# Patient Record
Sex: Female | Born: 1955 | Race: White | Hispanic: No | Marital: Single | Smoking: Never smoker
Health system: Southern US, Community
[De-identification: ages and names within clinical notes are randomized; demographics above are authoritative.]

## PROBLEM LIST (undated history)

## (undated) DIAGNOSIS — M7500 Adhesive capsulitis of unspecified shoulder: Secondary | ICD-10-CM

## (undated) DIAGNOSIS — H8110 Benign paroxysmal vertigo, unspecified ear: Secondary | ICD-10-CM

## (undated) DIAGNOSIS — M797 Fibromyalgia: Secondary | ICD-10-CM

## (undated) DIAGNOSIS — Z923 Personal history of irradiation: Secondary | ICD-10-CM

## (undated) DIAGNOSIS — N809 Endometriosis, unspecified: Secondary | ICD-10-CM

## (undated) DIAGNOSIS — B029 Zoster without complications: Secondary | ICD-10-CM

## (undated) DIAGNOSIS — R112 Nausea with vomiting, unspecified: Secondary | ICD-10-CM

## (undated) DIAGNOSIS — B54 Unspecified malaria: Secondary | ICD-10-CM

## (undated) DIAGNOSIS — E785 Hyperlipidemia, unspecified: Secondary | ICD-10-CM

## (undated) DIAGNOSIS — Z9889 Other specified postprocedural states: Secondary | ICD-10-CM

## (undated) DIAGNOSIS — C50919 Malignant neoplasm of unspecified site of unspecified female breast: Secondary | ICD-10-CM

## (undated) DIAGNOSIS — I890 Lymphedema, not elsewhere classified: Secondary | ICD-10-CM

## (undated) DIAGNOSIS — F419 Anxiety disorder, unspecified: Secondary | ICD-10-CM

## (undated) DIAGNOSIS — K219 Gastro-esophageal reflux disease without esophagitis: Secondary | ICD-10-CM

## (undated) HISTORY — PX: LAPAROSCOPY: SHX197

## (undated) HISTORY — PX: CHOLECYSTECTOMY: SHX55

## (undated) HISTORY — PX: VAGINAL HYSTERECTOMY: SUR661

## (undated) HISTORY — DX: Gastro-esophageal reflux disease without esophagitis: K21.9

## (undated) HISTORY — DX: Malignant neoplasm of unspecified site of unspecified female breast: C50.919

## (undated) HISTORY — DX: Zoster without complications: B02.9

## (undated) HISTORY — PX: MASTECTOMY: SHX3

## (undated) HISTORY — PX: BREAST CYST EXCISION: SHX579

## (undated) HISTORY — DX: Endometriosis, unspecified: N80.9

## (undated) HISTORY — DX: Hyperlipidemia, unspecified: E78.5

## (undated) HISTORY — DX: Adhesive capsulitis of unspecified shoulder: M75.00

## (undated) HISTORY — PX: TOTAL KNEE ARTHROPLASTY: SHX125

## (undated) HISTORY — PX: TONSILLECTOMY: SUR1361

## (undated) HISTORY — PX: BILATERAL SALPINGOOPHORECTOMY: SHX1223

## (undated) HISTORY — DX: Unspecified malaria: B54

## (undated) HISTORY — DX: Benign paroxysmal vertigo, unspecified ear: H81.10

---

## 1994-12-02 DIAGNOSIS — B54 Unspecified malaria: Secondary | ICD-10-CM

## 1994-12-02 HISTORY — DX: Unspecified malaria: B54

## 1998-05-23 ENCOUNTER — Other Ambulatory Visit: Admission: RE | Admit: 1998-05-23 | Discharge: 1998-05-23 | Payer: Self-pay | Admitting: *Deleted

## 1999-05-31 ENCOUNTER — Other Ambulatory Visit: Admission: RE | Admit: 1999-05-31 | Discharge: 1999-05-31 | Payer: Self-pay | Admitting: *Deleted

## 2000-06-27 ENCOUNTER — Other Ambulatory Visit: Admission: RE | Admit: 2000-06-27 | Discharge: 2000-06-27 | Payer: Self-pay | Admitting: *Deleted

## 2000-12-02 DIAGNOSIS — B029 Zoster without complications: Secondary | ICD-10-CM

## 2000-12-02 HISTORY — DX: Zoster without complications: B02.9

## 2001-06-25 ENCOUNTER — Encounter: Admission: RE | Admit: 2001-06-25 | Discharge: 2001-06-25 | Payer: Self-pay | Admitting: Internal Medicine

## 2001-06-25 ENCOUNTER — Encounter: Payer: Self-pay | Admitting: Internal Medicine

## 2001-07-29 ENCOUNTER — Other Ambulatory Visit: Admission: RE | Admit: 2001-07-29 | Discharge: 2001-07-29 | Payer: Self-pay | Admitting: *Deleted

## 2002-10-18 ENCOUNTER — Other Ambulatory Visit: Admission: RE | Admit: 2002-10-18 | Discharge: 2002-10-18 | Payer: Self-pay | Admitting: *Deleted

## 2003-01-11 ENCOUNTER — Encounter: Payer: Self-pay | Admitting: Internal Medicine

## 2003-01-11 ENCOUNTER — Encounter: Admission: RE | Admit: 2003-01-11 | Discharge: 2003-01-11 | Payer: Self-pay | Admitting: Internal Medicine

## 2003-01-31 ENCOUNTER — Encounter (INDEPENDENT_AMBULATORY_CARE_PROVIDER_SITE_OTHER): Payer: Self-pay | Admitting: Specialist

## 2003-01-31 ENCOUNTER — Encounter: Payer: Self-pay | Admitting: General Surgery

## 2003-01-31 ENCOUNTER — Ambulatory Visit (HOSPITAL_COMMUNITY): Admission: RE | Admit: 2003-01-31 | Discharge: 2003-02-01 | Payer: Self-pay | Admitting: General Surgery

## 2003-11-01 ENCOUNTER — Other Ambulatory Visit: Admission: RE | Admit: 2003-11-01 | Discharge: 2003-11-01 | Payer: Self-pay | Admitting: *Deleted

## 2003-11-07 ENCOUNTER — Ambulatory Visit (HOSPITAL_COMMUNITY): Admission: RE | Admit: 2003-11-07 | Discharge: 2003-11-07 | Payer: Self-pay | Admitting: *Deleted

## 2003-12-25 ENCOUNTER — Emergency Department (HOSPITAL_COMMUNITY): Admission: AD | Admit: 2003-12-25 | Discharge: 2003-12-25 | Payer: Self-pay | Admitting: Internal Medicine

## 2004-11-01 ENCOUNTER — Other Ambulatory Visit: Admission: RE | Admit: 2004-11-01 | Discharge: 2004-11-01 | Payer: Self-pay | Admitting: Obstetrics & Gynecology

## 2005-12-27 ENCOUNTER — Other Ambulatory Visit: Admission: RE | Admit: 2005-12-27 | Discharge: 2005-12-27 | Payer: Self-pay | Admitting: Obstetrics & Gynecology

## 2006-12-02 DIAGNOSIS — Z923 Personal history of irradiation: Secondary | ICD-10-CM

## 2006-12-02 DIAGNOSIS — C50919 Malignant neoplasm of unspecified site of unspecified female breast: Secondary | ICD-10-CM

## 2006-12-02 HISTORY — DX: Personal history of irradiation: Z92.3

## 2006-12-02 HISTORY — DX: Malignant neoplasm of unspecified site of unspecified female breast: C50.919

## 2006-12-02 HISTORY — PX: BREAST LUMPECTOMY: SHX2

## 2007-02-05 ENCOUNTER — Encounter: Admission: RE | Admit: 2007-02-05 | Discharge: 2007-02-05 | Payer: Self-pay | Admitting: Obstetrics & Gynecology

## 2007-02-16 ENCOUNTER — Encounter: Admission: RE | Admit: 2007-02-16 | Discharge: 2007-02-16 | Payer: Self-pay | Admitting: Oncology

## 2007-02-23 ENCOUNTER — Encounter: Admission: RE | Admit: 2007-02-23 | Discharge: 2007-02-23 | Payer: Self-pay | Admitting: Orthopedic Surgery

## 2007-03-02 ENCOUNTER — Encounter: Admission: RE | Admit: 2007-03-02 | Discharge: 2007-03-02 | Payer: Self-pay | Admitting: Oncology

## 2007-03-02 ENCOUNTER — Encounter (INDEPENDENT_AMBULATORY_CARE_PROVIDER_SITE_OTHER): Payer: Self-pay | Admitting: Diagnostic Radiology

## 2007-03-02 ENCOUNTER — Encounter (INDEPENDENT_AMBULATORY_CARE_PROVIDER_SITE_OTHER): Payer: Self-pay | Admitting: *Deleted

## 2007-03-11 ENCOUNTER — Encounter: Admission: RE | Admit: 2007-03-11 | Discharge: 2007-03-11 | Payer: Self-pay | Admitting: Orthopedic Surgery

## 2007-04-10 ENCOUNTER — Ambulatory Visit (HOSPITAL_COMMUNITY): Admission: RE | Admit: 2007-04-10 | Discharge: 2007-04-10 | Payer: Self-pay | Admitting: Surgery

## 2007-04-10 ENCOUNTER — Encounter: Admission: RE | Admit: 2007-04-10 | Discharge: 2007-04-10 | Payer: Self-pay | Admitting: Surgery

## 2007-04-10 ENCOUNTER — Encounter (INDEPENDENT_AMBULATORY_CARE_PROVIDER_SITE_OTHER): Payer: Self-pay | Admitting: Specialist

## 2007-04-16 ENCOUNTER — Ambulatory Visit: Payer: Self-pay | Admitting: Oncology

## 2007-04-22 LAB — COMPREHENSIVE METABOLIC PANEL
BUN: 12 mg/dL (ref 6–23)
CO2: 24 mEq/L (ref 19–32)
Calcium: 9.9 mg/dL (ref 8.4–10.5)
Chloride: 103 mEq/L (ref 96–112)
Creatinine, Ser: 0.68 mg/dL (ref 0.40–1.20)
Glucose, Bld: 91 mg/dL (ref 70–99)

## 2007-04-22 LAB — CBC WITH DIFFERENTIAL/PLATELET
Basophils Absolute: 0.1 10*3/uL (ref 0.0–0.1)
Eosinophils Absolute: 0.2 10*3/uL (ref 0.0–0.5)
HCT: 37.9 % (ref 34.8–46.6)
HGB: 13.4 g/dL (ref 11.6–15.9)
LYMPH%: 21.7 % (ref 14.0–48.0)
MCV: 83.2 fL (ref 81.0–101.0)
MONO#: 0.6 10*3/uL (ref 0.1–0.9)
MONO%: 8.3 % (ref 0.0–13.0)
NEUT#: 4.5 10*3/uL (ref 1.5–6.5)
NEUT%: 66.5 % (ref 39.6–76.8)
Platelets: 388 10*3/uL (ref 145–400)
RBC: 4.56 10*6/uL (ref 3.70–5.32)
WBC: 6.8 10*3/uL (ref 3.9–10.0)

## 2007-04-22 LAB — LACTATE DEHYDROGENASE: LDH: 165 U/L (ref 94–250)

## 2007-04-24 ENCOUNTER — Encounter: Admission: RE | Admit: 2007-04-24 | Discharge: 2007-04-24 | Payer: Self-pay | Admitting: Oncology

## 2007-05-01 ENCOUNTER — Ambulatory Visit (HOSPITAL_COMMUNITY): Admission: RE | Admit: 2007-05-01 | Discharge: 2007-05-01 | Payer: Self-pay | Admitting: Oncology

## 2007-05-26 ENCOUNTER — Encounter (INDEPENDENT_AMBULATORY_CARE_PROVIDER_SITE_OTHER): Payer: Self-pay | Admitting: Surgery

## 2007-05-26 ENCOUNTER — Inpatient Hospital Stay (HOSPITAL_COMMUNITY): Admission: RE | Admit: 2007-05-26 | Discharge: 2007-05-27 | Payer: Self-pay | Admitting: Surgery

## 2007-06-10 ENCOUNTER — Ambulatory Visit: Payer: Self-pay | Admitting: Oncology

## 2007-06-16 ENCOUNTER — Encounter (INDEPENDENT_AMBULATORY_CARE_PROVIDER_SITE_OTHER): Payer: Self-pay | Admitting: Obstetrics & Gynecology

## 2007-06-16 ENCOUNTER — Ambulatory Visit (HOSPITAL_COMMUNITY): Admission: RE | Admit: 2007-06-16 | Discharge: 2007-06-17 | Payer: Self-pay | Admitting: Obstetrics & Gynecology

## 2007-06-19 ENCOUNTER — Ambulatory Visit: Admission: RE | Admit: 2007-06-19 | Discharge: 2007-09-22 | Payer: Self-pay | Admitting: *Deleted

## 2007-06-21 ENCOUNTER — Inpatient Hospital Stay (HOSPITAL_COMMUNITY): Admission: AD | Admit: 2007-06-21 | Discharge: 2007-06-23 | Payer: Self-pay | Admitting: Obstetrics and Gynecology

## 2007-08-06 ENCOUNTER — Encounter: Admission: RE | Admit: 2007-08-06 | Discharge: 2007-11-04 | Payer: Self-pay | Admitting: Radiation Oncology

## 2007-08-11 ENCOUNTER — Ambulatory Visit (HOSPITAL_COMMUNITY): Admission: RE | Admit: 2007-08-11 | Discharge: 2007-08-11 | Payer: Self-pay | Admitting: Radiation Oncology

## 2007-09-22 ENCOUNTER — Ambulatory Visit: Payer: Self-pay | Admitting: Oncology

## 2007-09-24 LAB — CBC WITH DIFFERENTIAL/PLATELET
Basophils Absolute: 0 10*3/uL (ref 0.0–0.1)
EOS%: 0.1 % (ref 0.0–7.0)
HGB: 12.7 g/dL (ref 11.6–15.9)
MCH: 28.6 pg (ref 26.0–34.0)
NEUT#: 6.7 10*3/uL — ABNORMAL HIGH (ref 1.5–6.5)
RDW: 13.6 % (ref 11.3–14.5)
lymph#: 0.7 10*3/uL — ABNORMAL LOW (ref 0.9–3.3)

## 2007-09-24 LAB — COMPREHENSIVE METABOLIC PANEL
ALT: 15 U/L (ref 0–35)
AST: 18 U/L (ref 0–37)
Albumin: 4.8 g/dL (ref 3.5–5.2)
BUN: 14 mg/dL (ref 6–23)
Calcium: 9.7 mg/dL (ref 8.4–10.5)
Chloride: 102 mEq/L (ref 96–112)
Potassium: 3.9 mEq/L (ref 3.5–5.3)

## 2008-01-19 ENCOUNTER — Ambulatory Visit: Payer: Self-pay | Admitting: Oncology

## 2008-01-21 ENCOUNTER — Encounter: Admission: RE | Admit: 2008-01-21 | Discharge: 2008-01-21 | Payer: Self-pay | Admitting: Oncology

## 2008-01-29 ENCOUNTER — Ambulatory Visit (HOSPITAL_COMMUNITY): Admission: RE | Admit: 2008-01-29 | Discharge: 2008-01-29 | Payer: Self-pay | Admitting: Oncology

## 2008-07-12 ENCOUNTER — Ambulatory Visit: Payer: Self-pay | Admitting: Oncology

## 2008-07-14 LAB — CBC WITH DIFFERENTIAL/PLATELET
Basophils Absolute: 0 10*3/uL (ref 0.0–0.1)
Eosinophils Absolute: 0.1 10*3/uL (ref 0.0–0.5)
HGB: 11.8 g/dL (ref 11.6–15.9)
MCV: 86 fL (ref 81.0–101.0)
MONO#: 0.4 10*3/uL (ref 0.1–0.9)
MONO%: 6.5 % (ref 0.0–13.0)
NEUT#: 3.8 10*3/uL (ref 1.5–6.5)
Platelets: 261 10*3/uL (ref 145–400)
RDW: 14.1 % (ref 11.3–14.5)
WBC: 5.7 10*3/uL (ref 3.9–10.0)

## 2008-08-01 IMAGING — US US PELVIS COMPLETE
1 series · 11 of 11 positions shown · non-contrast
Comparison: none

CLINICAL DATA: Status post hysterectomy 06/16/07 with oophorectomy due to a history of breast cancer.  Pelvic pain.  The patient could not tolerate transvaginal or translabial technique.  
 TRANSABDOMINAL PELVIC ULTRASOUND:
TECHNIQUE: Transabdominal ultrasound examination of the pelvis was performed including evaluation of the uterus, ovaries, adnexal regions, and pelvic cul-de-sac.

[Series 1: us pelvis complete · 0.30mm/px · 11 of 11 slices shown]
[im 1/11]
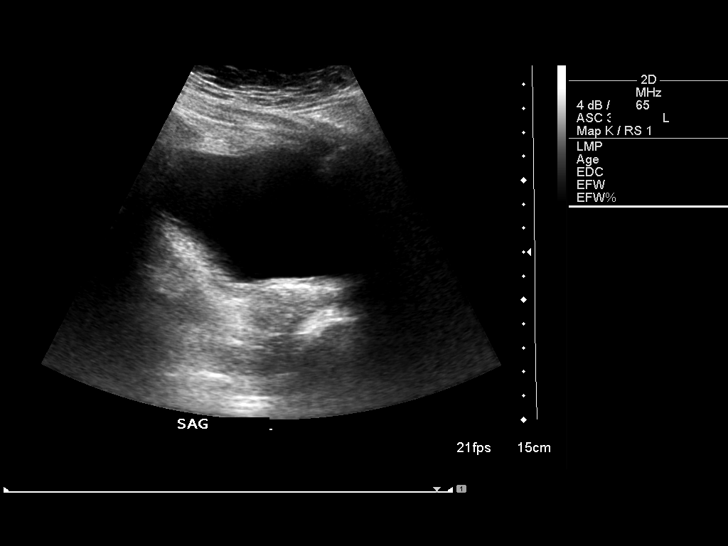
[im 2/11]
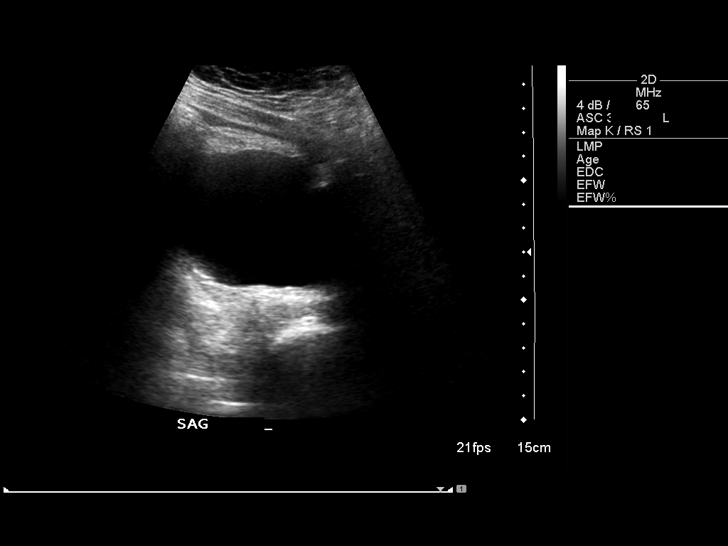
[im 3/11]
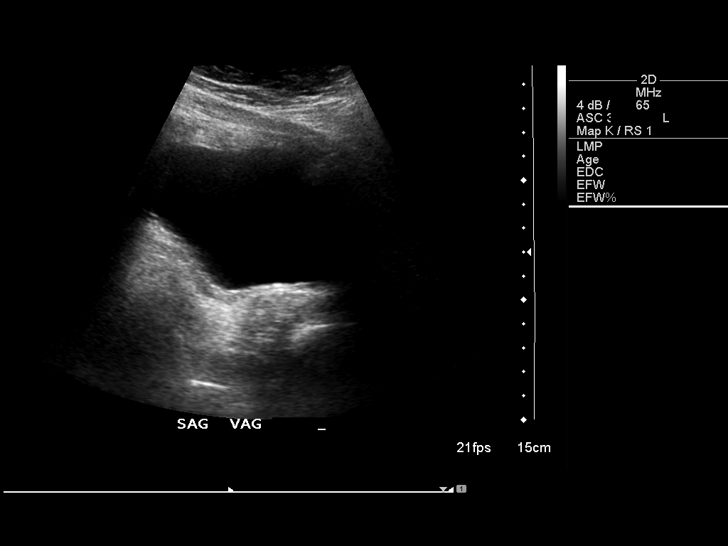
[im 4/11]
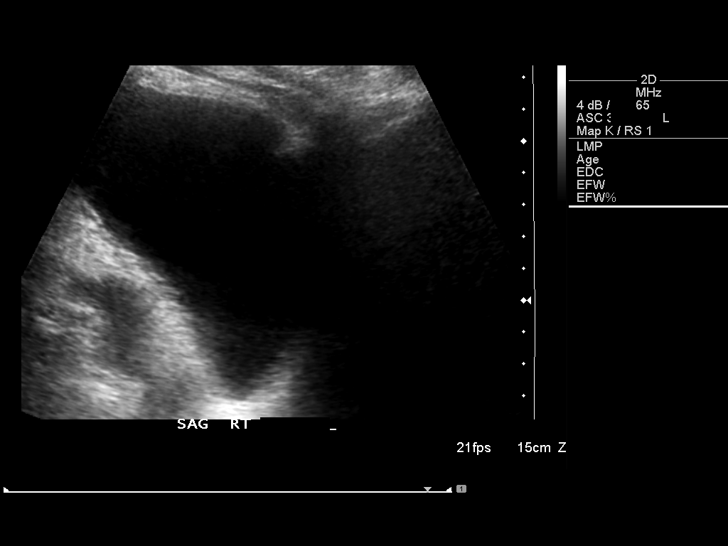
[im 5/11]
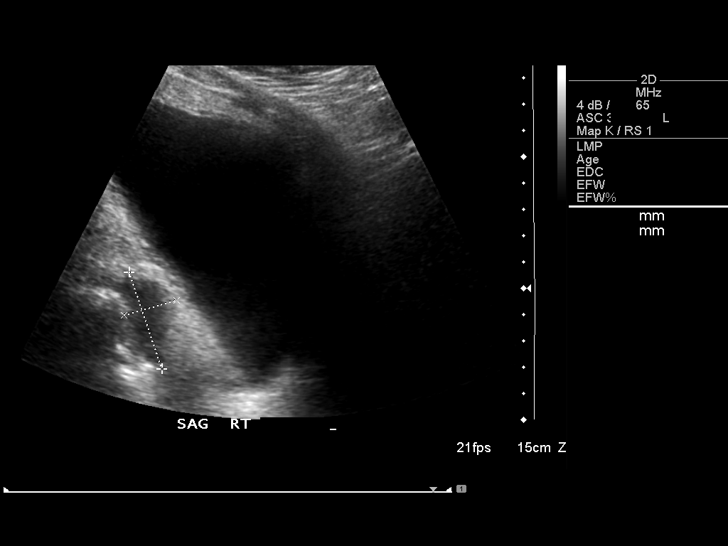
[im 6/11]
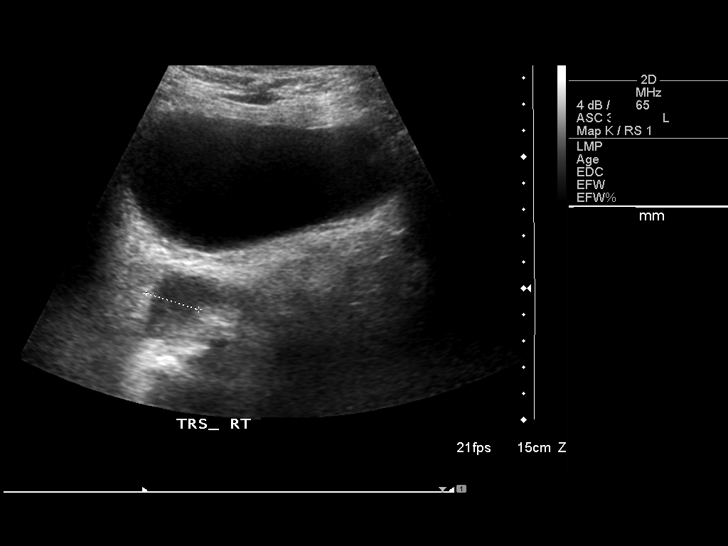
[im 7/11]
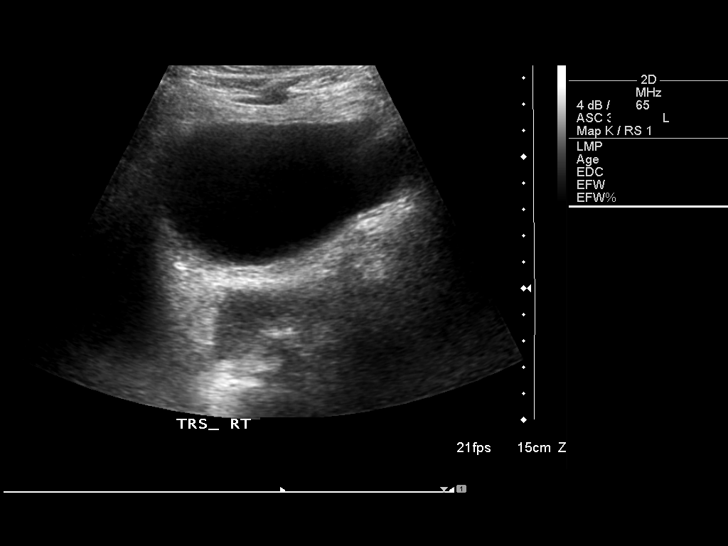
[im 8/11]
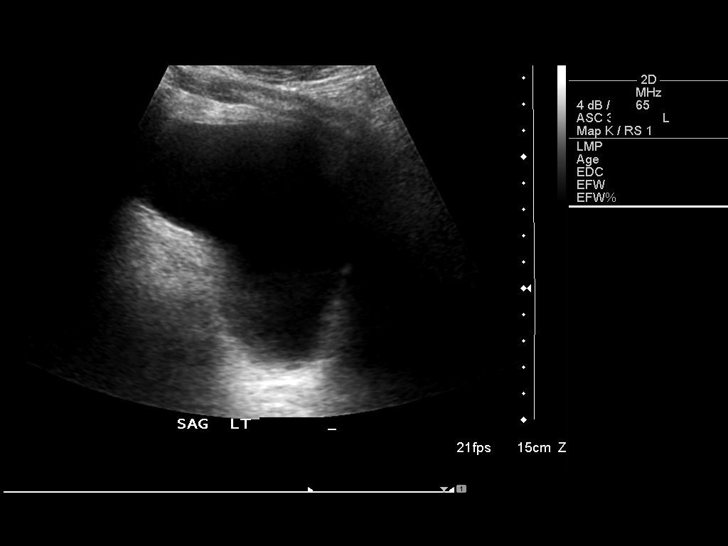
[im 9/11]
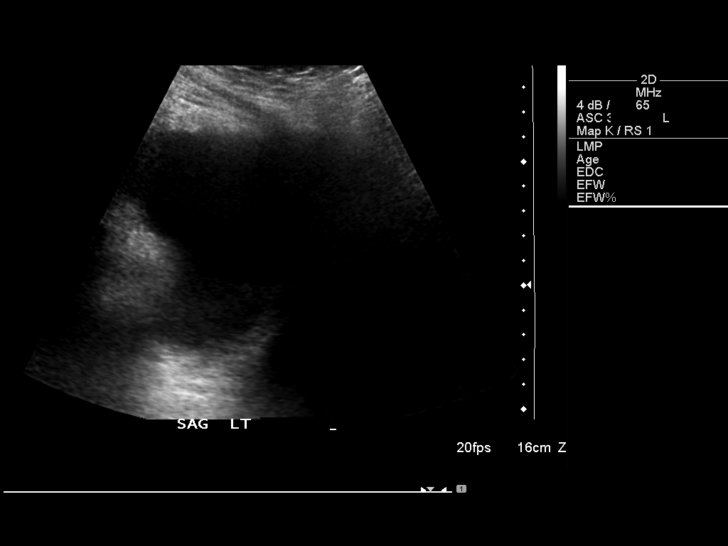
[im 10/11]
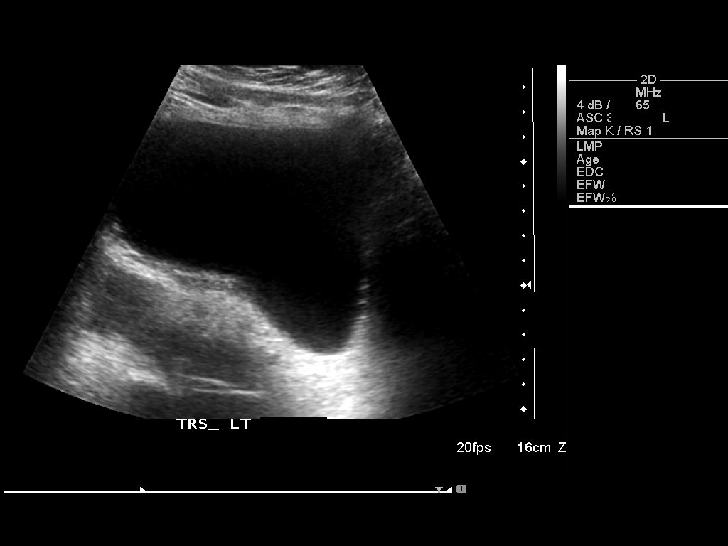
[im 11/11]
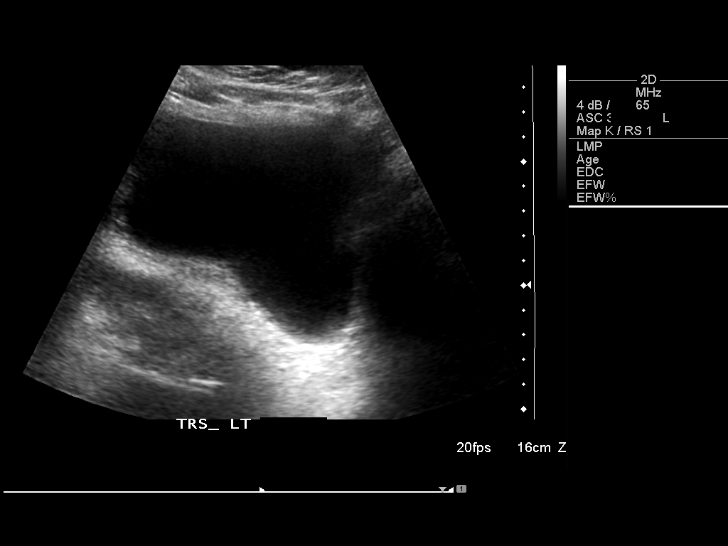

[11 of 11 positions shown; findings below may reference images not displayed]

FINDINGS: The vaginal cuff is unremarkable by transabdominal technique.  Neither ovary is visualized, compatible with the history of oophorectomy.  In the right adnexa is a 3.9 x 2.1 x 2.1 cm hypoechoic oval area.
IMPRESSION: 3.9 cm hypoechoic area in the region of the right adnexa, which could represent fluid collection, but is not further evaluated due to inability of the patient to tolerate transvaginal technique.  CT may be helpful for further evaluation, as clinically indicated.

## 2008-08-03 ENCOUNTER — Ambulatory Visit (HOSPITAL_COMMUNITY): Admission: RE | Admit: 2008-08-03 | Discharge: 2008-08-03 | Payer: Self-pay | Admitting: Oncology

## 2008-10-07 ENCOUNTER — Ambulatory Visit: Payer: Self-pay | Admitting: Oncology

## 2008-10-07 LAB — CBC WITH DIFFERENTIAL/PLATELET
Eosinophils Absolute: 0.1 10*3/uL (ref 0.0–0.5)
MCV: 86.8 fL (ref 81.0–101.0)
MONO#: 0.4 10*3/uL (ref 0.1–0.9)
MONO%: 7.1 % (ref 0.0–13.0)
NEUT#: 3.9 10*3/uL (ref 1.5–6.5)
RBC: 4.24 10*6/uL (ref 3.70–5.32)
RDW: 13.4 % (ref 11.3–14.5)
WBC: 5.8 10*3/uL (ref 3.9–10.0)
lymph#: 1.4 10*3/uL (ref 0.9–3.3)

## 2008-10-07 LAB — COMPREHENSIVE METABOLIC PANEL
Albumin: 3.7 g/dL (ref 3.5–5.2)
Alkaline Phosphatase: 48 U/L (ref 39–117)
BUN: 12 mg/dL (ref 6–23)
CO2: 31 mEq/L (ref 19–32)
Glucose, Bld: 80 mg/dL (ref 70–99)
Total Bilirubin: 0.8 mg/dL (ref 0.3–1.2)

## 2008-12-16 ENCOUNTER — Encounter: Payer: Self-pay | Admitting: Oncology

## 2008-12-16 ENCOUNTER — Ambulatory Visit: Admission: RE | Admit: 2008-12-16 | Discharge: 2008-12-16 | Payer: Self-pay | Admitting: Oncology

## 2008-12-16 ENCOUNTER — Ambulatory Visit: Payer: Self-pay | Admitting: Vascular Surgery

## 2008-12-22 ENCOUNTER — Encounter: Admission: RE | Admit: 2008-12-22 | Discharge: 2009-01-16 | Payer: Self-pay | Admitting: Oncology

## 2009-01-23 ENCOUNTER — Encounter: Admission: RE | Admit: 2009-01-23 | Discharge: 2009-01-23 | Payer: Self-pay | Admitting: Oncology

## 2009-02-17 ENCOUNTER — Encounter: Admission: RE | Admit: 2009-02-17 | Discharge: 2009-02-17 | Payer: Self-pay | Admitting: Oncology

## 2009-03-16 ENCOUNTER — Ambulatory Visit: Payer: Self-pay | Admitting: Oncology

## 2009-03-20 LAB — CBC WITH DIFFERENTIAL/PLATELET
Basophils Absolute: 0 10*3/uL (ref 0.0–0.1)
EOS%: 1.3 % (ref 0.0–7.0)
Eosinophils Absolute: 0.1 10*3/uL (ref 0.0–0.5)
HGB: 11.8 g/dL (ref 11.6–15.9)
LYMPH%: 29.6 % (ref 14.0–49.7)
MCH: 29.5 pg (ref 25.1–34.0)
MCV: 85.7 fL (ref 79.5–101.0)
MONO%: 7.7 % (ref 0.0–14.0)
NEUT#: 3.2 10*3/uL (ref 1.5–6.5)
Platelets: 259 10*3/uL (ref 145–400)

## 2009-03-20 LAB — COMPREHENSIVE METABOLIC PANEL
Alkaline Phosphatase: 44 U/L (ref 39–117)
BUN: 15 mg/dL (ref 6–23)
Creatinine, Ser: 0.63 mg/dL (ref 0.40–1.20)
Glucose, Bld: 99 mg/dL (ref 70–99)
Total Bilirubin: 0.5 mg/dL (ref 0.3–1.2)

## 2009-03-20 LAB — RESEARCH LABS

## 2009-03-21 LAB — VITAMIN D 25 HYDROXY (VIT D DEFICIENCY, FRACTURES): Vit D, 25-Hydroxy: 47 ng/mL (ref 30–89)

## 2009-03-21 LAB — CANCER ANTIGEN 27.29: CA 27.29: 12 U/mL (ref 0–39)

## 2009-08-18 ENCOUNTER — Encounter: Admission: RE | Admit: 2009-08-18 | Discharge: 2009-08-18 | Payer: Self-pay | Admitting: Oncology

## 2009-09-22 ENCOUNTER — Ambulatory Visit: Payer: Self-pay | Admitting: Oncology

## 2009-09-26 LAB — CBC WITH DIFFERENTIAL/PLATELET
BASO%: 0.6 % (ref 0.0–2.0)
Eosinophils Absolute: 0.1 10*3/uL (ref 0.0–0.5)
LYMPH%: 22.5 % (ref 14.0–49.7)
MCHC: 33.8 g/dL (ref 31.5–36.0)
MONO#: 0.2 10*3/uL (ref 0.1–0.9)
NEUT#: 4.2 10*3/uL (ref 1.5–6.5)
Platelets: 297 10*3/uL (ref 145–400)
RBC: 4.06 10*6/uL (ref 3.70–5.45)
RDW: 13.4 % (ref 11.2–14.5)
WBC: 5.9 10*3/uL (ref 3.9–10.3)
lymph#: 1.3 10*3/uL (ref 0.9–3.3)

## 2009-09-27 LAB — LACTATE DEHYDROGENASE: LDH: 134 U/L (ref 94–250)

## 2009-09-27 LAB — COMPREHENSIVE METABOLIC PANEL
ALT: 11 U/L (ref 0–35)
Albumin: 4.2 g/dL (ref 3.5–5.2)
CO2: 24 mEq/L (ref 19–32)
Glucose, Bld: 170 mg/dL — ABNORMAL HIGH (ref 70–99)
Potassium: 3.9 mEq/L (ref 3.5–5.3)
Sodium: 140 mEq/L (ref 135–145)
Total Bilirubin: 0.3 mg/dL (ref 0.3–1.2)
Total Protein: 6.6 g/dL (ref 6.0–8.3)

## 2009-11-02 ENCOUNTER — Encounter: Admission: RE | Admit: 2009-11-02 | Discharge: 2009-11-02 | Payer: Self-pay | Admitting: Oncology

## 2010-03-28 ENCOUNTER — Ambulatory Visit: Payer: Self-pay | Admitting: Oncology

## 2010-03-29 LAB — COMPREHENSIVE METABOLIC PANEL
Albumin: 3.8 g/dL (ref 3.5–5.2)
CO2: 30 mEq/L (ref 19–32)
Calcium: 9.2 mg/dL (ref 8.4–10.5)
Chloride: 107 mEq/L (ref 96–112)
Glucose, Bld: 96 mg/dL (ref 70–99)
Potassium: 4 mEq/L (ref 3.5–5.3)
Sodium: 142 mEq/L (ref 135–145)
Total Bilirubin: 0.5 mg/dL (ref 0.3–1.2)
Total Protein: 6.5 g/dL (ref 6.0–8.3)

## 2010-03-29 LAB — CBC WITH DIFFERENTIAL/PLATELET
Eosinophils Absolute: 0.1 10*3/uL (ref 0.0–0.5)
LYMPH%: 28.2 % (ref 14.0–49.7)
MONO#: 0.5 10*3/uL (ref 0.1–0.9)
NEUT#: 3.8 10*3/uL (ref 1.5–6.5)
Platelets: 273 10*3/uL (ref 145–400)
RBC: 4.04 10*6/uL (ref 3.70–5.45)
WBC: 6.1 10*3/uL (ref 3.9–10.3)
lymph#: 1.7 10*3/uL (ref 0.9–3.3)

## 2010-03-29 LAB — LACTATE DEHYDROGENASE: LDH: 148 U/L (ref 94–250)

## 2010-03-30 LAB — CANCER ANTIGEN 27.29: CA 27.29: 10 U/mL (ref 0–39)

## 2010-08-08 ENCOUNTER — Encounter: Admission: RE | Admit: 2010-08-08 | Discharge: 2010-09-20 | Payer: Self-pay | Admitting: Oncology

## 2010-08-09 ENCOUNTER — Ambulatory Visit: Payer: Self-pay | Admitting: Oncology

## 2010-08-20 ENCOUNTER — Encounter: Admission: RE | Admit: 2010-08-20 | Discharge: 2010-08-20 | Payer: Self-pay | Admitting: Oncology

## 2010-10-09 ENCOUNTER — Ambulatory Visit: Payer: Self-pay | Admitting: Oncology

## 2010-10-11 LAB — CBC WITH DIFFERENTIAL/PLATELET
EOS%: 1.3 % (ref 0.0–7.0)
LYMPH%: 25.6 % (ref 14.0–49.7)
MCH: 30 pg (ref 25.1–34.0)
MCHC: 34.7 g/dL (ref 31.5–36.0)
MCV: 86.3 fL (ref 79.5–101.0)
MONO%: 6.5 % (ref 0.0–14.0)
Platelets: 264 10*3/uL (ref 145–400)
RBC: 3.93 10*6/uL (ref 3.70–5.45)
RDW: 13.4 % (ref 11.2–14.5)

## 2010-10-12 LAB — COMPREHENSIVE METABOLIC PANEL
AST: 20 U/L (ref 0–37)
Albumin: 4.1 g/dL (ref 3.5–5.2)
Alkaline Phosphatase: 45 U/L (ref 39–117)
BUN: 13 mg/dL (ref 6–23)
Potassium: 3.8 mEq/L (ref 3.5–5.3)
Sodium: 143 mEq/L (ref 135–145)
Total Bilirubin: 0.3 mg/dL (ref 0.3–1.2)
Total Protein: 6.2 g/dL (ref 6.0–8.3)

## 2010-10-12 LAB — CANCER ANTIGEN 27.29: CA 27.29: 10 U/mL (ref 0–39)

## 2010-10-12 LAB — VITAMIN D 25 HYDROXY (VIT D DEFICIENCY, FRACTURES): Vit D, 25-Hydroxy: 72 ng/mL (ref 30–89)

## 2010-12-23 ENCOUNTER — Encounter: Payer: Self-pay | Admitting: Orthopedic Surgery

## 2011-04-11 ENCOUNTER — Other Ambulatory Visit: Payer: Self-pay | Admitting: Oncology

## 2011-04-11 ENCOUNTER — Encounter (HOSPITAL_BASED_OUTPATIENT_CLINIC_OR_DEPARTMENT_OTHER): Payer: PRIVATE HEALTH INSURANCE | Admitting: Oncology

## 2011-04-11 DIAGNOSIS — C50219 Malignant neoplasm of upper-inner quadrant of unspecified female breast: Secondary | ICD-10-CM

## 2011-04-11 DIAGNOSIS — Z170 Estrogen receptor positive status [ER+]: Secondary | ICD-10-CM

## 2011-04-11 LAB — CBC WITH DIFFERENTIAL/PLATELET
Basophils Absolute: 0.1 10*3/uL (ref 0.0–0.1)
EOS%: 1.6 % (ref 0.0–7.0)
Eosinophils Absolute: 0.1 10*3/uL (ref 0.0–0.5)
HCT: 34.5 % — ABNORMAL LOW (ref 34.8–46.6)
HGB: 11.9 g/dL (ref 11.6–15.9)
MCH: 29.8 pg (ref 25.1–34.0)
NEUT#: 2.8 10*3/uL (ref 1.5–6.5)
NEUT%: 59.7 % (ref 38.4–76.8)
RDW: 13.5 % (ref 11.2–14.5)
lymph#: 1.4 10*3/uL (ref 0.9–3.3)

## 2011-04-11 LAB — COMPREHENSIVE METABOLIC PANEL
Albumin: 3.9 g/dL (ref 3.5–5.2)
BUN: 11 mg/dL (ref 6–23)
CO2: 29 mEq/L (ref 19–32)
Calcium: 9.6 mg/dL (ref 8.4–10.5)
Chloride: 102 mEq/L (ref 96–112)
Creatinine, Ser: 0.61 mg/dL (ref 0.40–1.20)
Glucose, Bld: 96 mg/dL (ref 70–99)
Potassium: 3.5 mEq/L (ref 3.5–5.3)

## 2011-04-12 LAB — VITAMIN D 25 HYDROXY (VIT D DEFICIENCY, FRACTURES): Vit D, 25-Hydroxy: 55 ng/mL (ref 30–89)

## 2011-04-12 LAB — CANCER ANTIGEN 27.29: CA 27.29: 13 U/mL (ref 0–39)

## 2011-04-16 NOTE — Op Note (Signed)
NAMEBLAYKE, Savannah [name] NO.:  [id number]   MEDICAL RECORD NO.:  [id number]          PATIENT TYPE:  OIB   LOCATION:  9399                          FACILITY:  [facility]   PHYSICIAN:  Freddy Finner, M.D.   DATE OF BIRTH:  01-Feb-1956   DATE OF PROCEDURE:  DATE OF DISCHARGE:                               OPERATIVE REPORT   PREOPERATIVE DIAGNOSES:  1. Breast cancer with recommended oophorectomy.  2. Uterine leiomyomata.  3. Previous history of pelvic endometriosis.   POSTOPERATIVE DIAGNOSES:  1. Breast cancer with recommended oophorectomy.  2. Uterine leiomyomata.  3. Previous history of pelvic endometriosis.   OPERATIVE PROCEDURE:  Laparoscopically assisted vaginal hysterectomy,  bilateral salpingo-oophorectomy.   SECONDARY DIAGNOSES:  1. Pelvic adhesions and pericecal adhesions.  2. Normal appendix.  3. Uterine weight of 297 g.  4. Estimated intraoperative blood loss 350 cc.  Surgeon Dr. Jennette Kettle.      Assistant __________  .  Anesthesia:  General endotracheal.   INTRAOPERATIVE COMPLICATIONS:  None, although the degree of difficulty  was moderate due to the patient's nulliparous state and the very large  size of her uterus.   The patient is a 55 year old who was recently diagnosed with breast  cancer.  At the recommendation of her oncologist she has requested an  oophorectomy, but because of her known uterine leiomyomata it is elected  to proceed with more definitive surgery including hysterectomy,  bilateral salpingo-oophorectomy.  She is admitted on the morning of  surgery for that procedure.  She was given a 1 g bolus of Ancef IV  preoperatively.  She was placed in serial compression hose for deep vein  thrombosis prophylaxis.  She was brought to the operating room; there  placed on __________  adequate general endotracheal anesthesia.  Placed  in the dorsal lithotomy position using the [facility] stirrup system.  Betadine prep of the abdomen, peroneum, mons, and  vagina was carried out  using Betadine scrub followed by Betadine solution.  Bladder was  evacuated with sterile technique using a Foley catheter.  Cervix was  visualized, and __________  tenaculum attached under direct  visualization.  Sterile drapes were applied.  Two small incisions were  made; one in the umbilicus and one just above the symphysis.  Through  the upper incision an 11 mm bladed disposable trocar was introduced  while manually elevating the abdominal wall.  Direct inspection with a  laparoscope revealed adequate placement with no evidence of injury on  entry.  __________  using carbon dioxide gas.  A 5 mm trocar was placed  through the lower incision under direct visualization and through it.  Various instruments used during the procedure including a spring-loaded  grasping forceps and Nezhat irrigation system.  Systematic examination  of abdominal and pelvic contents was carried out, and photographs were  made and retained in the office record.  The patient did not appear to  have abnormalities in the upper abdomen.  The liver was visualized.  There were some pericecal adhesions presumed to be congenital __________  in the absence of any history of appendicitis.  They did not  create a  blind loop and were treated.  The appendix was visualized and  photographed and was normal.  The uterus was markedly enlarged and  irregularly nodular.  The ovaries were small and atrophic in appearance.  The left ovary was adherent to the pelvic side wall.  There were  scattered lesions consistent with tattooing from previous laser  procedure for endometriosis.  No active evidence of endometriosis was  identified.  Using the grasping forceps the right adnexa the elevated.  The Jarvis tripolar device was used to the operating channel of the  laparoscope and the __________  pelvic ligament.  Upper broad ligament,  round ligament were progressively sealed and divided.  The dissection  was  carried down to a level just above the uterine arteries.  The left  side was treated and essentially identically, and in the process of  elevating the ovary the adhesions attaching it to the peritoneum were  freed with no evidence of residual ovarian tissue in the peritoneum.  This dissection too was carried down to the level just above the uterine  arteries.  Cul-de-sac was clear anteriorly and posteriorly.  Attention  was then turned vaginally.  The patient's nulliparous state and  postmenopausal state compromised exposure because of the narrow vaginal  vault and vaginal canal.  __________  posterior vaginal retractor was  used.  __________  retractors were used anteriorly and laterally.  The  Hulka tenaculum was removed.  The cervix was grasped with a Christella Hartigan  tenaculum.  __________  incision was made while attempting the mucosa  posterior to the cervix.  The cervix was circumscribed with a scalpel to  release the mucosa.  The ligature bipolar device was then used to seal  and divide the __________  pedicles, bladder pillars.  This was done on  each side.  The bladder was carefully advanced along the cervix  anteriorly.  Continued progressive small __________  were taken with the  ligature to seal and then sharply divide the pedicles including the  cardinal ligament pedicle.  Anterior peritoneum was entered.  The  bladder was filled with irrigating solution stained with __________  to  be certain that no trauma or opening to the bladder had occurred, and  this was confirmed.  Continued pedicles were slowly and carefully  developed sealing the cardinal ligaments and perhaps two or three  pedicles.  The uterine artery pedicle was sealed and divided.  A pedicle  above the uterine arteries was sealed and divided.  The uterus was then  __________  delivery.  Total weight of the uterus was 297 g.  One small  peritoneal pedicle was sealed and divided with ligature after delivery  of the uterus  through the vaginal introitus.  __________  of the vagina  were anchored to the uterus __________  with mattress sutures 0  Monocryl.  Posterior peritoneum was closed, and uterus __________  was  plicated with uninterrupted 0 Monocryl suture.  Cuff was closed  vertically with figure of eight x0 Monocryl.  Hemostasis of the cuff was  complete.  There was a small second degree laceration in the midline of  the vagina posteriorly due to the traction and the postmenopausal  condition of the vagina.  This was closed with a figure of eight and 0  Monocryl with adequate hemostasis.  Foley catheter was left indwelling.  Reinspection was carried out abdominally using the laparoscope and  Nezhat system.  Copious irrigation was carried out.  All pedicles were  hemostatic.  This was confirmed under __________  abdominal pressure.  The irrigating solution was aspirated from the abdomen.  Gas was allowed  to escape from the abdomen.  The instruments were removed.  The  incisions were anesthetized with 1/4% plain Marcaine.  Incisions were  closed with interrupted __________  sutures of 3-0 Dexon.  Steri-Strips  were applied to the lower incision.  The patient was awake and taken to  the recovery room in good condition.      Freddy Finner, M.D.  Electronically Signed     WRN/MEDQ  D:  06/16/2007  T:  06/16/2007  Job:  [id number]   cc:   Pierce Crane, M.D.  Fax: [phone]

## 2011-04-16 NOTE — H&P (Signed)
Savannah [name], BACORN NO.:  [id number]   MEDICAL RECORD NO.:  [id number]          PATIENT TYPE:  AMB   LOCATION:  SDC                           FACILITY:  [facility]   PHYSICIAN:  Freddy Finner, M.D.   DATE OF BIRTH:  Jul 17, 1956   DATE OF ADMISSION:  06/16/2007  DATE OF DISCHARGE:                              HISTORY & PHYSICAL   ADMITTING DIAGNOSES:  1. Uterine leiomyomata.  2. Estrogen receptor positive breast cancer.   The patient is a 55 year old, white, single female, gravida 2, para 0  who was recently diagnosed with breast cancer and has completed her  primary lumpectomy and sentinel node axillary biopsy.  She is being  followed by Dr. Pierce Crane for oncology care.  After a consultation  with the patient and with Dr. Donnie Coffin it is felt preferred that she have  bilateral salpingo-oophorectomy and because of the presence of fibroids,  the plan is to do a laparoscopically-assisted vaginal hysterectomy and  bilateral salpingo-oophorectomy.   CURRENT REVIEW OF SYSTEMS:  Primarily remarkable for fibromyalgia  symptoms.  She does have some tenderness in the right axilla and  anterior chest wall consistent with the recent biopsies.  She has no GI  or GU complaints.   PAST MEDICAL HISTORY:  The patient has no known significant systemic  medical problems other than fibromyalgia.   PREVIOUS SURGICAL PROCEDURES:  1. D&E for TAB many years ago.  2. The recent surgery described above is her only other surgical      procedure.   DRUG ALLERGIES:  1. DARVON.  2. ULTRAM.   She has never required a blood transfusion.  She is not a cigarette  smoker.   MEDICATIONS:  1. Flexeril.  2. Effexor extended release 150 mg a day.  3. Pepcid as needed.  4. Xanax as needed.  5. Percocet as needed for fibromyalgia pain.  6. Ambien 10 mg at bedtime as needed for sleep.   FAMILY HISTORY:  Noncontributory.   PHYSICAL EXAMINATION:  HEENT:  Grossly within normal limits.   Thyroid  gland is not palpably enlarged.  VITAL SIGNS:  Blood pressure in the office 120/70, weight 184.8.  CHEST:  Clear to auscultation throughout.  HEART:  Normal sinus rhythm without murmurs, rubs, or gallops.  BREAST:  Remarkable for 2 recent surgical scars on the right breast. The  left breast is unremarkable.  ABDOMEN:  Soft and nontender without appreciable organomegaly or  palpable masses.  EXTREMITIES:  Without cyanosis, clubbing, or edema.  PELVIC:  External genitalia, vagina, and cervix are normal to  inspection.  Bimanual reveals the uterus to be anterior and 8-9 weeks'  gestation size.  There are no palpable adnexal masses.  The rectum is  palpably normal and rectovaginal exam confirms the above findings.   ASSESSMENT:  1. Uterine leiomyomata.  2. Recent diagnosis of estrogen receptor positive breast cancer.   PLAN:  Laparoscopically-assisted vaginal hysterectomy and bilateral  salpingo-oophorectomy as part of her treatment plan for breast cancer.  The patient has reviewed a video in the office today which carefully  describes the operative procedure proposed  including the potential risk  of injury to other organs, risk of hemorrhage, risk of infection, risk  of larger surgery.  I have discussed this at length with her.  She is  prepared to proceed and is admitted at this time for that purpose.      Freddy Finner, M.D.  Electronically Signed     WRN/MEDQ  D:  06/15/2007  T:  06/15/2007  Job:  [id number]

## 2011-04-16 NOTE — Discharge Summary (Signed)
[name], [name] NO.:  [id number]   MEDICAL RECORD NO.:  [id number]          PATIENT TYPE:  INP   LOCATION:  9302                          FACILITY:  [facility]   PHYSICIAN:  Freddy Finner, M.D.   DATE OF BIRTH:  1956/05/07   DATE OF ADMISSION:  06/21/2007  DATE OF DISCHARGE:  06/23/2007                               DISCHARGE SUMMARY   ADMITTING DIAGNOSIS:  Cuff cellulitis following laparoscopic-assisted  vaginal hysterectomy.   DISCHARGE DIAGNOSIS:  Cuff cellulitis following laparoscopic-assisted  vaginal hysterectomy.   DISCHARGE DISPOSITION:  The patient was afebrile for at least 24 hours  at the time of her discharge on the third hospital day.  At that time  her white count was 7.1.  She was discharged home to continue all  antibiotics to complete a full week of therapy, specifically I think she  received Augmentin 875 b.i.d.  She is to resume all of her other  preoperative discharge instructions from her  discharge on June 17, 2007.  She is also to resume all of her admission medications recorded  on the reconciliation sheet.   Present illness and physical findings are recorded in the admission note  which was dictated by the nurse practitioner.  Her findings on admission  were a temperature of 101.  She had ultrasound findings showing a 3.9 cm  cystic right adnexal mass.  She had granular induration along the  posterior vaginal wall consistent with vaginal wall laceration at  hysterectomy which was sutured.  She had pelvic and lower abdominal  tenderness.   LABORATORY DATA DURING THIS ADMISSION:  A CBC on admission with a white  count of 9.3, hemoglobin 10.7.  Serial white counts were obtained during  the hospital stay with a white count was 7.1 on the day of discharge,  hemoglobin of 10.1.  Urinalysis on admission was normal.   HOSPITAL COURSE:  Following admission, the patient was started on Unasyn  3 g IV every 6 hours.  She was given a morphine  PCA pump for pain  relief.  Her clinical course was without major problems as she  significantly improved over a period of 48 hours and remained afebrile  even when converted to oral Augmentin.  On the day of her discharge her  abdomen was less tender.  She was subjectively improved.  She was  considered to be in good enough condition for discharge and was  discharged to follow up in the office in approximately one week.  Again,  she is to resume all of her medications as noted above and to call for  recurrence of symptoms.      Freddy Finner, M.D.  Electronically Signed     WRN/MEDQ  D:  07/25/2007  T:  07/26/2007  Job:  [id number]

## 2011-04-16 NOTE — Op Note (Signed)
NAMECARTHA, ROTERT             ACCOUNT NO.:  [id number]   MEDICAL RECORD NO.:  [id number]          PATIENT TYPE:  INP   LOCATION:  1335                         FACILITY:  [facility]   PHYSICIAN:  Clovis Pu. Cornett, M.D.DATE OF BIRTH:  Apr 09, 1956   DATE OF PROCEDURE:  05/26/2007  DATE OF DISCHARGE:                               OPERATIVE REPORT   PREOPERATIVE DIAGNOSIS:  Right breast cancer, positive sentinel lymph  node.   POSTOPERATIVE DIAGNOSIS:  Right breast cancer, positive sentinel lymph  node.   PROCEDURE:  Right axillary lymph node dissection.   SURGEON:  Harriette Bouillon, M.D.   ANESTHESIA:  General endotracheal anesthesia with 10 mL 0.25%  Sensorcaine.   DRAIN:  19 Blake drain to right axillary cavity.   SPECIMEN:  Right axillary contents, lymph nodes to pathology.   INDICATIONS FOR PROCEDURE:  The patient is a 55 year old female found to  have a right breast cancer who underwent lumpectomy, sentinel lymph  node.  The sentinel lymph node ended up being positive.  She returns  today for right axillary lymph node dissection.   DESCRIPTION OF PROCEDURE:  The patient was brought to the operating  room, placed supine.  The right axilla was prepped and draped in sterile  fashion after induction of general anesthesia.  I infiltrated 10 mL of  0.25% Sensorcaine into the right axilla.  Incision was made with  extension of the previous sentinel lymph node incision in the right  axilla just inferior to the hairline of the right axilla.  Dissection  was carried down.  There was considerable scarring but I was able to go  back into the axilla.  I was then able to dissect up to the axillary  vein, identified the thoracodorsal trunk as well as the long thoracic  nerves.  These were all preserved.  All contents in between these three  structures were taken down.  There were at least 3-4 nodes that felt  like was in the fatty specimen.  I palpated the remainder of the axilla  and  felt no other nodes or other tissue since all other tissues were  beyond these boundaries and did not feel would be useful to get any at  this point.  Through a separate stab wound a 19 Blake drain was placed  and secured with 3-0 nylon.  I then irrigated, suctioned out all excess  irrigation placed Surgicel in the wound.  Hemostasis was excellent.  Wound was closed two layers using 3-0 Vicryl and 4-0 Monocryl.  Dermabond  was used to close skin.  Drain was placed to suction and had a good  seal.  All final counts of sponge, needle, instruments found to be  correct at this portion of the case.  The patient was awoke taken to  recovery in satisfactory condition.      Thomas A. Cornett, M.D.  Electronically Signed     TAC/MEDQ  D:  05/26/2007  T:  05/27/2007  Job:  [id number]   cc:   Pierce Crane, M.D.  Fax: [phone]

## 2011-04-16 NOTE — Discharge Summary (Signed)
NAMEEILAH, COMMON NO.:  [id number]   MEDICAL RECORD NO.:  [id number]          PATIENT TYPE:  OIB   LOCATION:  9307                          FACILITY:  [facility]   PHYSICIAN:  Freddy Finner, M.D.   DATE OF BIRTH:  [birth year]   DATE OF ADMISSION:  06/16/2007  DATE OF DISCHARGE:  06/17/2007                               DISCHARGE SUMMARY   PRIMARY ADMITTING DIAGNOSIS:  Estrogen-receptor positive breast cancer  with medical oncology recommendation for bilateral oophorectomy.   SECONDARY DIAGNOSES:  1. Uterine leiomyomata.  2. 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   OPERATIVE PROCEDURE:  Laparoscopically-assisted vaginal hysterectomy,  bilateral salpingo-oophorectomy.   INTRAOPERATIVE AND POSTOPERATIVE COMPLICATIONS:  None.   DISPOSITION:  The patient was in satisfactory improved condition on the  day following surgery.  She was discharged home with routine  postoperative instructions including avoiding vaginal entry or heavy  lifting.  She is to report fever, severe pain, or heavy vaginal  bleeding.  She is to resume all of her preoperative medications which  are listed in the reconciliation form in the hospital record including  medications for her fibromyalgia.  In addition, she is to use narcotic  pain medication which she has on hand including Vicodin and/or Percocet.  She is to return to the office in 2 weeks for postoperative follow-up.  She is to take a regular diet.   Details of the present illness, past history, family history, review of  systems, and physical exam are recorded in the admission note.  Briefly  the primary indication for the patient's surgery of oophorectomy as part  of treatment for breast cancer.  She is also known to have uterine  leiomyomata and this is considered an elective additional procedure in  the interest of reducing long-term risk of other problems.  Her physical  findings on admission are  remarkable for scarring of her breast  secondary of surgery and for the pelvic findings of uterine enlargement.   LABORATORY DATA DURING THIS ADMISSION:  Included an admission CBC with  hemoglobin of 12.2.  admission prothrombin time and PTT were normal.  Postoperative hemoglobin was 9.5.   HOSPITAL COURSE:  The patient was admitted on the morning of surgery.  She was given an IV bolus of Ancef preoperatively.  She was placed in  PIS compression hose for prophylaxis against deep vein thrombosis.  She  was brought to the operating room, placed under general anesthesia, and  the above-described procedure accomplished without difficulty.  Her  postoperative course was remarkable for mild anemia but this was  appropriate for estimated  intraoperative blood loss.  With this exception, by the first  postoperative day she was ambulating without assistance.  She was having  adequate bowel and bladder function, she was tolerating a regular diet,  she was adequately controlled for pain with her oral medications, and  she was discharged home with disposition as noted above.      Freddy Finner, M.D.  Electronically Signed     WRN/MEDQ  D:  07/25/2007  T:  07/26/2007  Job:  [id number]

## 2011-04-16 NOTE — Op Note (Signed)
[name], [name]             ACCOUNT NO.:  [id number]   MEDICAL RECORD NO.:  [id number]          PATIENT TYPE:  AMB   LOCATION:  SDS                          FACILITY:  [facility]   PHYSICIAN:  Thomas A. Cornett, M.D.DATE OF BIRTH:  1956-11-30   DATE OF PROCEDURE:  04/10/2007  DATE OF DISCHARGE:                               OPERATIVE REPORT   PREOPERATIVE DIAGNOSIS:  Right breast cancer.   POSTOPERATIVE DIAGNOSIS:  Right breast cancer.   PROCEDURE:  1. Right breast new localized lumpectomy.  2. Right breast sentinel lymph node mapping with injection of      methylene blue dye.   SURGEON:  Maisie Fus A. Cornett, M.D.   ASSISTANT:  None.   ANESTHESIA:  LMA with about 30 mL of 0.25% Sensorcaine local.   ESTIMATED BLOOD LOSS:  20 mL.   SPECIMENS:  1. Right breast needle localized specimen; found to be adequate by      mammogram.  2. Three right axillary sentinel lymph nodes, negative by touch prep.   DRAINS:  None.   INDICATIONS FOR PROCEDURE:  The patient is a 55 year old female, recent  diagnosis of right breast cancer.  She presents today for lumpectomy and  sentinel lymph node for treatment of this.  The procedure was discussed  with the patient properly and she voiced understanding and agreed to  proceed.   DESCRIPTION OF PROCEDURE:  After the patient underwent needle  localization and injection of radial isotope dye, she was brought to the  operating room.  Right breast was prepped and draped in a sterile  fashion after induction of LMA anesthesia.  Neoprobe was used.  Small  incision was made in the right axilla with the Neoprobe guidance.  We  dissected into level-1 axillary nodes.  We found 3 sentinel nodes, which  were all hot with 2 to 3 being blue as well and they were sent to  pathology and found to be negative by touch prep.  Hemostasis was  excellent.   Next, the lumpectomy was performed.  The tumor was in the right upper-  inner quadrant.  A wire was  localized in this space as well.  The curved-  linear incision was used and dissection was carried down around the  wire.  Dissection was carried up under the nipple and we were able to  dissect circumferentially around the tumor.  Of note, there is very  dense fibrocystic-type tissue, which made it somewhat difficult.  We  were able to get this out with what I felt was an adequate margin.  I  took an additional medial margin.  At this point, irrigation was used to  suction.  I  closed down the cavity with 3-0 Vicryl, 4-0 Monocryl was used to close  the skin.  The axilla was closed as well with 4-0 Monocryl.  Dermabond  was used as a bandage.  All final counts, sponge, needle instruments  were found to be correct this portion of the case.  The patient was  awoke, taken to recovery, in satisfactory condition.      Thomas A. Cornett, M.D.  Electronically  Signed     TAC/MEDQ  D:  04/10/2007  T:  04/10/2007  Job:  [id number]   cc:   Theressa Millard, M.D.

## 2011-04-19 ENCOUNTER — Other Ambulatory Visit: Payer: Self-pay | Admitting: Oncology

## 2011-04-19 ENCOUNTER — Encounter (HOSPITAL_BASED_OUTPATIENT_CLINIC_OR_DEPARTMENT_OTHER): Payer: PRIVATE HEALTH INSURANCE | Admitting: Oncology

## 2011-04-19 DIAGNOSIS — Z170 Estrogen receptor positive status [ER+]: Secondary | ICD-10-CM

## 2011-04-19 DIAGNOSIS — Z9889 Other specified postprocedural states: Secondary | ICD-10-CM

## 2011-04-19 DIAGNOSIS — C50219 Malignant neoplasm of upper-inner quadrant of unspecified female breast: Secondary | ICD-10-CM

## 2011-08-22 ENCOUNTER — Ambulatory Visit
Admission: RE | Admit: 2011-08-22 | Discharge: 2011-08-22 | Disposition: A | Discharge status: Home / Self Care | Payer: PRIVATE HEALTH INSURANCE | Source: Ambulatory Visit | Attending: Oncology | Admitting: Oncology

## 2011-08-22 DIAGNOSIS — Z9889 Other specified postprocedural states: Secondary | ICD-10-CM

## 2011-09-16 LAB — CBC
HCT: 27.9 — ABNORMAL LOW
HCT: 29.4 — ABNORMAL LOW
HCT: 30 — ABNORMAL LOW
HCT: 32 — ABNORMAL LOW
HCT: 35.7 — ABNORMAL LOW
Hemoglobin: 10.1 — ABNORMAL LOW
Hemoglobin: 12.2
Hemoglobin: 9.5 — ABNORMAL LOW
MCV: 86.3
MCV: 86.4
Platelets: 288
Platelets: 354
Platelets: 367
Platelets: 382
RBC: 3.27 — ABNORMAL LOW
RBC: 3.41 — ABNORMAL LOW
RBC: 4.18
RDW: 13
WBC: 4.6
WBC: 8.1
WBC: 9.3
WBC: 9.7

## 2011-09-16 LAB — URINE MICROSCOPIC-ADD ON

## 2011-09-16 LAB — URINALYSIS, ROUTINE W REFLEX MICROSCOPIC
Bilirubin Urine: NEGATIVE
Nitrite: NEGATIVE
Specific Gravity, Urine: 1.01
pH: 6

## 2011-09-16 LAB — APTT: aPTT: 26

## 2011-09-16 LAB — DIFFERENTIAL
Eosinophils Relative: 4
Lymphocytes Relative: 12
Lymphs Abs: 1.1
Neutrophils Relative %: 75

## 2011-09-18 LAB — DIFFERENTIAL
Basophils Relative: 1
Lymphs Abs: 1.6
Monocytes Absolute: 0.4
Monocytes Relative: 8
Neutro Abs: 3.4

## 2011-09-18 LAB — COMPREHENSIVE METABOLIC PANEL
ALT: 33
Albumin: 3.9
Alkaline Phosphatase: 59
BUN: 11
Calcium: 10.2
Potassium: 4.3
Sodium: 142
Total Protein: 7

## 2011-09-18 LAB — CBC
Platelets: 329
RDW: 13.5

## 2011-10-21 ENCOUNTER — Other Ambulatory Visit: Payer: Self-pay | Admitting: Oncology

## 2011-10-21 ENCOUNTER — Other Ambulatory Visit (HOSPITAL_BASED_OUTPATIENT_CLINIC_OR_DEPARTMENT_OTHER): Payer: PRIVATE HEALTH INSURANCE | Admitting: Lab

## 2011-10-21 DIAGNOSIS — C50419 Malignant neoplasm of upper-outer quadrant of unspecified female breast: Secondary | ICD-10-CM

## 2011-10-21 DIAGNOSIS — Z170 Estrogen receptor positive status [ER+]: Secondary | ICD-10-CM

## 2011-10-21 LAB — CBC WITH DIFFERENTIAL/PLATELET
Basophils Absolute: 0 10*3/uL (ref 0.0–0.1)
EOS%: 2.2 % (ref 0.0–7.0)
Eosinophils Absolute: 0.1 10*3/uL (ref 0.0–0.5)
HGB: 12.3 g/dL (ref 11.6–15.9)
LYMPH%: 26.9 % (ref 14.0–49.7)
MCH: 29.6 pg (ref 25.1–34.0)
MCV: 87.1 fL (ref 79.5–101.0)
MONO%: 6.2 % (ref 0.0–14.0)
NEUT%: 63.9 % (ref 38.4–76.8)
Platelets: 247 10*3/uL (ref 145–400)
RDW: 13.1 % (ref 11.2–14.5)

## 2011-10-22 LAB — COMPREHENSIVE METABOLIC PANEL
AST: 21 U/L (ref 0–37)
Alkaline Phosphatase: 43 U/L (ref 39–117)
BUN: 13 mg/dL (ref 6–23)
Creatinine, Ser: 0.67 mg/dL (ref 0.50–1.10)
Glucose, Bld: 90 mg/dL (ref 70–99)
Potassium: 4 mEq/L (ref 3.5–5.3)
Total Bilirubin: 0.2 mg/dL — ABNORMAL LOW (ref 0.3–1.2)

## 2011-10-22 LAB — VITAMIN D 25 HYDROXY (VIT D DEFICIENCY, FRACTURES): Vit D, 25-Hydroxy: 53 ng/mL (ref 30–89)

## 2011-10-31 ENCOUNTER — Telehealth: Payer: Self-pay | Admitting: *Deleted

## 2011-10-31 NOTE — Telephone Encounter (Signed)
patient confirmed over the phone the new date and time of the new appointment on 12-11-2011 at 1:30pm

## 2011-11-01 ENCOUNTER — Ambulatory Visit: Payer: PRIVATE HEALTH INSURANCE | Admitting: Oncology

## 2011-12-11 ENCOUNTER — Ambulatory Visit (HOSPITAL_BASED_OUTPATIENT_CLINIC_OR_DEPARTMENT_OTHER): Payer: PRIVATE HEALTH INSURANCE | Admitting: Oncology

## 2011-12-11 ENCOUNTER — Other Ambulatory Visit (HOSPITAL_BASED_OUTPATIENT_CLINIC_OR_DEPARTMENT_OTHER): Payer: PRIVATE HEALTH INSURANCE | Admitting: Lab

## 2011-12-11 DIAGNOSIS — C50919 Malignant neoplasm of unspecified site of unspecified female breast: Secondary | ICD-10-CM

## 2011-12-11 DIAGNOSIS — Z853 Personal history of malignant neoplasm of breast: Secondary | ICD-10-CM

## 2011-12-11 DIAGNOSIS — E559 Vitamin D deficiency, unspecified: Secondary | ICD-10-CM

## 2011-12-11 DIAGNOSIS — C50419 Malignant neoplasm of upper-outer quadrant of unspecified female breast: Secondary | ICD-10-CM

## 2011-12-11 DIAGNOSIS — Z923 Personal history of irradiation: Secondary | ICD-10-CM

## 2011-12-11 NOTE — Progress Notes (Signed)
Hematology and Oncology Follow Up Visit  Savannah [name] [id number] 1956/10/06 56 y.o. 12/11/2011 3:06 PM PCP dr Shela Commons Earl Gala;   Principle Diagnosis: T1B N0, ER/PR positive breast cancer status post lumpectomy in 07/06/2007 status post radiation therapy completed 09/01/2007 status post oophorectomy, currently on tamoxifen with trial of Arimidex and Aromasin in the past.   Interim History:  There have been no intercurrent illness, hospitalizations or medication changes. Having some reflux, satiety issues.. Needs colonoscopy/ EGD soon Medications: I have reviewed the patient's current medications.  Allergies:  Allergies  Allergen Reactions  . Darvon Itching and Nausea Only    Past Medical History, Surgical history, Social history, and Family History were reviewed and updated.  Review of Systems: Constitutional:  Negative for fever, chills, night sweats, anorexia, weight loss, pain. Cardiovascular: no chest pain or dyspnea on exertion Respiratory: no cough, shortness of breath, or wheezing Neurological: negative Dermatological: negative ENT: negative Skin Gastrointestinal: no abdominal pain, change in bowel habits, or black or bloody stools Genito-Urinary: no dysuria, trouble voiding, or hematuria Hematological and Lymphatic: negative Breast: negative Musculoskeletal: negative Remaining ROS negative.  Physical Exam: Blood pressure 135/92, pulse 90, temperature 97.5 F (36.4 C), temperature source Oral, height 5\' 5"  (1.651 m), weight 179 lb 14.4 oz (81.602 kg). ECOG: 0 General appearance: alert, cooperative and appears stated age Head: Normocephalic, without obvious abnormality, atraumatic Neck: no adenopathy, no carotid bruit, no JVD, supple, symmetrical, trachea midline and thyroid not enlarged, symmetric, no tenderness/mass/nodules Lymph nodes: Cervical, supraclavicular, and axillary nodes normal. Cardiac : regular rate and rhythm, no murmurs or gallops Pulmonary:clear to  auscultation bilaterally and normal percussion bilaterally Breasts: inspection negative, no nipple discharge or bleeding, no masses or nodularity palpable Abdomen:soft, non-tender; bowel sounds normal; no masses,  no organomegaly Extremities negative Neuro: alert, oriented, normal speech, no focal findings or movement disorder noted  Lab Results: Lab Results  Component Value Date   WBC 5.3 10/21/2011   HGB 12.3 10/21/2011   HCT 36.2 10/21/2011   MCV 87.1 10/21/2011   PLT 247 10/21/2011     Chemistry      Component Value Date/Time   NA 140 10/21/2011 1342   NA 140 10/21/2011 1342   NA 140 10/21/2011 1342   K 4.0 10/21/2011 1342   K 4.0 10/21/2011 1342   K 4.0 10/21/2011 1342   CL 104 10/21/2011 1342   CL 104 10/21/2011 1342   CL 104 10/21/2011 1342   CO2 26 10/21/2011 1342   CO2 26 10/21/2011 1342   CO2 26 10/21/2011 1342   BUN 13 10/21/2011 1342   BUN 13 10/21/2011 1342   BUN 13 10/21/2011 1342   CREATININE 0.67 10/21/2011 1342   CREATININE 0.67 10/21/2011 1342   CREATININE 0.67 10/21/2011 1342      Component Value Date/Time   CALCIUM 9.7 10/21/2011 1342   CALCIUM 9.7 10/21/2011 1342   CALCIUM 9.7 10/21/2011 1342   ALKPHOS 43 10/21/2011 1342   ALKPHOS 43 10/21/2011 1342   ALKPHOS 43 10/21/2011 1342   AST 21 10/21/2011 1342   AST 21 10/21/2011 1342   AST 21 10/21/2011 1342   ALT 14 10/21/2011 1342   ALT 14 10/21/2011 1342   ALT 14 10/21/2011 1342   BILITOT 0.2* 10/21/2011 1342   BILITOT 0.2* 10/21/2011 1342   BILITOT 0.2* 10/21/2011 1342      .pathology. Radiological Studies: chest X-ray n/a Mammogram n/a Bone density n/a  Impression and Plan: Ms angello is doing well without evidence of disease, I will  see her in 6 months with appropriate imaging studies.  More than 50% of the visit was spent in patient-related counselling   Pierce Crane, MD 1/9/20133:06 PM

## 2011-12-19 ENCOUNTER — Other Ambulatory Visit: Payer: Self-pay | Admitting: Oncology

## 2011-12-19 DIAGNOSIS — C50219 Malignant neoplasm of upper-inner quadrant of unspecified female breast: Secondary | ICD-10-CM

## 2012-03-09 ENCOUNTER — Other Ambulatory Visit: Payer: Self-pay | Admitting: *Deleted

## 2012-03-09 ENCOUNTER — Telehealth: Payer: Self-pay | Admitting: *Deleted

## 2012-03-09 NOTE — Telephone Encounter (Signed)
gave patient appointment for 03-10-2012 at 1:30pm patient confirmed over the phone

## 2012-03-10 ENCOUNTER — Ambulatory Visit: Payer: PRIVATE HEALTH INSURANCE | Admitting: Oncology

## 2012-03-11 ENCOUNTER — Encounter: Payer: Self-pay | Admitting: *Deleted

## 2012-03-11 NOTE — Progress Notes (Unsigned)
Pt cancelled her appt with MD stating that the redness at the surgical"  is resolving on its own" will call if sx reoccur

## 2012-12-10 ENCOUNTER — Ambulatory Visit: Payer: PRIVATE HEALTH INSURANCE | Admitting: Oncology

## 2012-12-10 ENCOUNTER — Telehealth: Payer: Self-pay | Admitting: Oncology

## 2012-12-10 ENCOUNTER — Other Ambulatory Visit: Payer: PRIVATE HEALTH INSURANCE | Admitting: Lab

## 2012-12-10 NOTE — Telephone Encounter (Signed)
Pt called to find out when she will be scheduled- and I explained that she will receive a call soon.

## 2012-12-11 ENCOUNTER — Other Ambulatory Visit: Payer: Self-pay | Admitting: Oncology

## 2012-12-11 DIAGNOSIS — Z853 Personal history of malignant neoplasm of breast: Secondary | ICD-10-CM

## 2012-12-11 DIAGNOSIS — Z9889 Other specified postprocedural states: Secondary | ICD-10-CM

## 2012-12-13 ENCOUNTER — Other Ambulatory Visit: Payer: Self-pay | Admitting: Oncology

## 2012-12-23 ENCOUNTER — Telehealth: Payer: Self-pay | Admitting: Oncology

## 2012-12-23 ENCOUNTER — Other Ambulatory Visit: Payer: Self-pay | Admitting: Oncology

## 2012-12-23 ENCOUNTER — Ambulatory Visit
Admission: RE | Admit: 2012-12-23 | Discharge: 2012-12-23 | Disposition: A | Discharge status: Home / Self Care | Payer: BC Managed Care – PPO | Source: Ambulatory Visit | Attending: Oncology | Admitting: Oncology

## 2012-12-23 DIAGNOSIS — Z9889 Other specified postprocedural states: Secondary | ICD-10-CM

## 2012-12-23 DIAGNOSIS — C50919 Malignant neoplasm of unspecified site of unspecified female breast: Secondary | ICD-10-CM

## 2012-12-23 DIAGNOSIS — Z853 Personal history of malignant neoplasm of breast: Secondary | ICD-10-CM

## 2012-12-23 NOTE — Telephone Encounter (Signed)
S/w the pt and she is aware of her lab appt on 12/24/2012@1 :45pm and her md appt on 12/25/2012@11 :00am.

## 2012-12-24 ENCOUNTER — Other Ambulatory Visit (HOSPITAL_BASED_OUTPATIENT_CLINIC_OR_DEPARTMENT_OTHER): Payer: BC Managed Care – PPO | Admitting: Lab

## 2012-12-24 DIAGNOSIS — C50419 Malignant neoplasm of upper-outer quadrant of unspecified female breast: Secondary | ICD-10-CM

## 2012-12-24 DIAGNOSIS — C50919 Malignant neoplasm of unspecified site of unspecified female breast: Secondary | ICD-10-CM

## 2012-12-24 LAB — CBC WITH DIFFERENTIAL/PLATELET
Basophils Absolute: 0.1 10*3/uL (ref 0.0–0.1)
Eosinophils Absolute: 0.1 10*3/uL (ref 0.0–0.5)
HGB: 12.1 g/dL (ref 11.6–15.9)
MCV: 85.2 fL (ref 79.5–101.0)
MONO#: 0.4 10*3/uL (ref 0.1–0.9)
MONO%: 6.9 % (ref 0.0–14.0)
NEUT#: 4.1 10*3/uL (ref 1.5–6.5)
Platelets: 264 10*3/uL (ref 145–400)
RDW: 13.2 % (ref 11.2–14.5)
WBC: 6.2 10*3/uL (ref 3.9–10.3)

## 2012-12-24 LAB — LACTATE DEHYDROGENASE (CC13): LDH: 169 U/L (ref 125–245)

## 2012-12-24 LAB — COMPREHENSIVE METABOLIC PANEL (CC13)
BUN: 14 mg/dL (ref 7.0–26.0)
CO2: 25 mEq/L (ref 22–29)
Calcium: 9.3 mg/dL (ref 8.4–10.4)
Chloride: 106 mEq/L (ref 98–107)
Creatinine: 0.7 mg/dL (ref 0.6–1.1)
Total Bilirubin: 0.29 mg/dL (ref 0.20–1.20)

## 2012-12-25 ENCOUNTER — Telehealth: Payer: Self-pay | Admitting: Oncology

## 2012-12-25 ENCOUNTER — Ambulatory Visit (HOSPITAL_BASED_OUTPATIENT_CLINIC_OR_DEPARTMENT_OTHER): Payer: BC Managed Care – PPO | Admitting: Oncology

## 2012-12-25 VITALS — BP 125/78 | HR 85 | Temp 98.3°F | Resp 20 | Ht 65.0 in | Wt 183.0 lb

## 2012-12-25 DIAGNOSIS — C50919 Malignant neoplasm of unspecified site of unspecified female breast: Secondary | ICD-10-CM

## 2012-12-25 DIAGNOSIS — C50219 Malignant neoplasm of upper-inner quadrant of unspecified female breast: Secondary | ICD-10-CM

## 2012-12-25 DIAGNOSIS — Z170 Estrogen receptor positive status [ER+]: Secondary | ICD-10-CM

## 2012-12-25 LAB — CANCER ANTIGEN 27.29: CA 27.29: 10 U/mL (ref 0–39)

## 2012-12-25 NOTE — Progress Notes (Signed)
ID: [name]   DOB: [birth year]  MR#: [id number]  OZH#:[id number]  PCP: Darnelle Bos, MD GYN: Konrad Dolores SU: Thomas Cornett OTHER MD: Margaretmary Bayley   HISTORY OF PRESENT ILLNESS: (cf Dr Broadus John note 07/01/07)  The patient had an abnormal screening mammogram on January 22, 2007.  She returned on March 6 for repeat compression views of the right breast as well as ultrasound.  The compression views confirmed the presence of an ill-defined increased density mass with spiculated margins.  Ultrasound failed to demonstrate any abnormality in this region.  The patient returned on March 17 for breast MRI.  This showed a bilobed irregularly enhancing mass measuring 1.4 cm.  Since the MRI showed the tumor best, the patient underwent MRI-guided breast biopsy on March 31.  This showed invasive mammary carcinoma.  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  Three lymph nodes were sampled and one contained a macroscopic deposit of metastatic disease with a small amount of extracapsular extension.  The patient met with Dr. Pierce Crane.  She underwent oncotype testing with an overall score of 18 indicating 11% recurrence rate for node negative patients.  Based on this, she was leaning away from systemic chemotherapy, pending completion axillary dissection.  She also underwent complete staging with CT scans of the chest, abdomen, and pelvis on May 30 as well as bone scan on May 30.  The bone scans showed arthritic changes at the L5-S1 level.  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  The patient proceeded to actual lymph node dissection on June 24.  Dr. Luisa Hart was able to remove 13 additional lymph nodes and these were all free of metastatic involvement.  In followup with Dr. Donnie Coffin, Ms. Ledee elected to proceed  with oophorectomy.  This was performed by Dr. Konrad Dolores on July 15.  Her subsequent history is as detailed below.  INTERVAL HISTORY: Raynelle Fanning returns today for followup of her breast cancer. The interval history is significant for her having recently seen Dr. Jennette Kettle regarding a concern about her left supraclavicular and left neck areas.  REVIEW OF SYSTEMS: She feels the left neck area is a bit fuller than the right, and sometimes she has strange feelings up the left neck, which she finds hard to describe. They're not like an electric shock, or sore, or and make, just a difference. This is very intermittent. She has occasional problems with the ear drainage, her feet sometimes well bilaterally, she has a history of heartburn, and she has some arthritis. The joint pains are not more frequent more persistent or more intense than before. She does have hot flashes which are stable. She has right upper extremity lymphedema which is mild but chronic. A detailed review of systems today was otherwise noncontributory.  PAST MEDICAL HISTORY: No past medical history on file. History of fibromyalgia, remote history of malaria, history of shingles, history of endometriosis, remote history of vascular headaches, history of breast cancer  PAST SURGICAL HISTORY: No past surgical history on file. Status post right lumpectomy and full axillary lymph node dissection, status post hysterectomy with bilateral stopping the oophorectomy, status post cholecystectomy, status post tonsillectomy and adenoidectomy.  FAMILY HISTORY No family history on file. The patient's father died at age 20 from a heart attack. The patient's mother died at  age 6 from colon cancer, which had been diagnosed less than 6 months before. Raynelle Fanning and has a brother who is Scientist, physiological of the Chief Technology Officer at [employer]. He lives in [place]. She had no sisters. There is no history of breast or ovarian cancer in the family.  GYNECOLOGIC  HISTORY: Menarche age 10, she is GX P0. She had her hysterectomy in March of 2008. She never took hormone replacement. She did take birth control pills between 1990 and 2008, with no clotting or other complications.  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 She is single and lives by herself, with no pets.   ADVANCED DIRECTIVES: In place. She has named her friend Seward Carol as her healthcare power of attorney. : Evelyn's cell number is [phone].  HEALTH MAINTENANCE: History  Substance Use Topics  . Smoking status: Not on file  . Smokeless tobacco: Not on file  . Alcohol Use: Not on file     Colonoscopy: due/ "[place] [facility]" is scheduling  PAP: s/p hysterectomy  Bone density: Dr Jennette Kettle has scheduled  Lipid panel:  Allergies  Allergen Reactions  . Darvon Itching and Nausea Only    Current Outpatient Prescriptions  Medication Sig Dispense Refill  . calcium-vitamin D (OSCAL WITH D) 500-200 MG-UNIT per tablet Take 1 tablet by mouth daily. Pt. Not sure of dose      . cholecalciferol (VITAMIN D) 1000 UNITS tablet Take 1,000 Units by mouth daily.      . cyclobenzaprine (FLEXERIL) 10 MG tablet Take 10 mg by mouth daily as needed.      . diphenhydramine-acetaminophen (TYLENOL PM) 25-500 MG TABS Take 1 tablet by mouth at bedtime as needed.      . famotidine (PEPCID) 20 MG tablet Take 20 mg by mouth 2 (two) times daily.      . Multiple Vitamin (MULTIVITAMIN) tablet Take 1 tablet by mouth daily.      . tamoxifen (NOLVADEX) 20 MG tablet TAKE 1 TABLET BY MOUTH DAILY  90 tablet  0  . vitamin E 200 UNIT capsule Take 200 Units by mouth 2 (two) times daily. Pt. Not sure of dose        OBJECTIVE: Middle-aged white woman who appears well Filed Vitals:   12/25/12 1103  BP: 125/78  Pulse: 85  Temp: 98.3 F (36.8 C)  Resp: 20     Body mass index is 30.45 kg/(m^2).    ECOG FS: 0  Sclerae unicteric Oropharynx clear No cervical or  supraclavicular adenopathy; no masses or irregularity is palpated in the left neck or left supraclavicular area. There is minimal dissymmetry in the fat pad at the base of the neck bilaterally, with the left being a little bit fuller. Lungs no rales or rhonchi Heart regular rate and rhythm Abd benign MSK no focal spinal tenderness, minimal right upper extremity lymphedema Neuro: nonfocal, well oriented, positive affect Breasts: The right breast is status post lumpectomy and radiation. There is no evidence of local recurrence. The right axilla is benign. The left breast is unremarkable.   LAB RESULTS: Lab Results  Component Value Date   WBC 6.2 12/24/2012   NEUTROABS 4.1 12/24/2012   HGB 12.1 12/24/2012   HCT 35.3 12/24/2012   MCV 85.2 12/24/2012   PLT 264 12/24/2012      Chemistry      Component Value Date/Time   NA 140 12/24/2012 1401   NA 140 10/21/2011 1342   NA  140 10/21/2011 1342   NA 140 10/21/2011 1342   K 4.0 12/24/2012 1401   K 4.0 10/21/2011 1342   K 4.0 10/21/2011 1342   K 4.0 10/21/2011 1342   CL 106 12/24/2012 1401   CL 104 10/21/2011 1342   CL 104 10/21/2011 1342   CL 104 10/21/2011 1342   CO2 25 12/24/2012 1401   CO2 26 10/21/2011 1342   CO2 26 10/21/2011 1342   CO2 26 10/21/2011 1342   BUN 14.0 12/24/2012 1401   BUN 13 10/21/2011 1342   BUN 13 10/21/2011 1342   BUN 13 10/21/2011 1342   CREATININE 0.7 12/24/2012 1401   CREATININE 0.67 10/21/2011 1342   CREATININE 0.67 10/21/2011 1342   CREATININE 0.67 10/21/2011 1342      Component Value Date/Time   CALCIUM 9.3 12/24/2012 1401   CALCIUM 9.7 10/21/2011 1342   CALCIUM 9.7 10/21/2011 1342   CALCIUM 9.7 10/21/2011 1342   ALKPHOS 56 12/24/2012 1401   ALKPHOS 43 10/21/2011 1342   ALKPHOS 43 10/21/2011 1342   ALKPHOS 43 10/21/2011 1342   AST 20 12/24/2012 1401   AST 21 10/21/2011 1342   AST 21 10/21/2011 1342   AST 21 10/21/2011 1342   ALT 19 12/24/2012 1401   ALT 14 10/21/2011 1342   ALT 14 10/21/2011 1342    ALT 14 10/21/2011 1342   BILITOT 0.29 12/24/2012 1401   BILITOT 0.2* 10/21/2011 1342   BILITOT 0.2* 10/21/2011 1342   BILITOT 0.2* 10/21/2011 1342       Lab Results  Component Value Date   LABCA2 10 12/24/2012    No components found with this basename: [id number]    No results found for this basename: INR:1;PROTIME:1 in the last 168 hours  Urinalysis    Component Value Date/Time   COLORURINE YELLOW 06/21/2007 1029   APPEARANCEUR CLEAR 06/21/2007 1029   LABSPEC 1.010 06/21/2007 1029   PHURINE 6.0 06/21/2007 1029   GLUCOSEU NEGATIVE 06/21/2007 1029   HGBUR MODERATE* 06/21/2007 1029   BILIRUBINUR NEGATIVE 06/21/2007 1029   KETONESUR NEGATIVE 06/21/2007 1029   PROTEINUR NEGATIVE 06/21/2007 1029   UROBILINOGEN 0.2 06/21/2007 1029   NITRITE NEGATIVE 06/21/2007 1029   LEUKOCYTESUR NEGATIVE 06/21/2007 1029    STUDIES: Mm Digital Diagnostic Bilat  12/23/2012  *RADIOLOGY REPORT*  Clinical Data:  Personal history of right breast cancer status post lumpectomy 2008  DIGITAL DIAGNOSTIC BILATERAL MAMMOGRAM WITH CAD  Comparison: August 22, 2011, August 20, 2010, August 18, 2009  Findings:  ACR Breast Density Category scattered fibroglandular tissue  CC and MLO views of the right breast, spot tangential view of the right breast are submitted.  Stable postsurgical changes are identified within the right breast.  The left breast is negative.  Mammographic images were processed with CAD.  IMPRESSION: Benign findings.  RECOMMENDATION: Bilateral diagnostic mammogram in 1 year.  I have discussed the findings and recommendations with the patient. Results were also provided in writing at the conclusion of the visit.  BI-RADS CATEGORY 2:  Benign finding(s).   Original Report Authenticated By: Sherian Rein, M.D.     ASSESSMENT: 57 y.o. [place] woman  (1) status post right lumpectomy and sentinel lymph node sampling Apr 10 2007 for a pT1b pN1, stage IIA invasive ductal carcinoma, grade 1, estrogen receptor  95% and progesterone receptor 94% positive, with an MIB-1 of 13%, and no evidence of HER-2 amplification  (2) full axillary dissection 05/26/2007 showed an additional 13 lymph nodes to be negative.  (3) oncotype score  of 18 indicating 11 % recurrence rate if her only adjuvant systemic treatment was tamoxifen for 5 years  (3) adjuvant radiation completed 09/01/2007  (4) status post total abdominal hysterectomy with bilateral salpingo-oophorectomy 06/16/2007, with benign pathology.  (5) on anastrozole and exemestane for about 6 months total, with poor tolerance; on tamoxifen as of February 2009  PLAN: We spent the better part of today's hour-long visit going over her history and treatment plan. On December 2012 data was presented suggesting that patients like her benefit from a total of 10 years of tamoxifen as compared to 5. The further risk reduction is in the 4% range. This means oh her risk of recurrence would be not 11% but 7% at the end of treatment.  She is an excellent candidate for tamoxifen since she does not have a uterus, does not have cataracts, and is tolerating it without significant secondary side effects. She is willing to continue for 10 years and accordingly the plan will be to stand tamoxifen uncle February of 2019.  I reassured her that I do not find any abnormality in the left neck or left supraclavicular area. She had an unremarkable neck ultrasound February of 2009 and plain films of the left shoulder in September of 2009. I don't think any further evaluation is needed at this point.  I will see her again in January of 2015, shortly after her mammogram. Of course she will call if any problems develop before the next visit.   MAGRINAT,GUSTAV C    12/25/2012

## 2012-12-25 NOTE — Telephone Encounter (Signed)
gv pt appt schedule for January 2015. °

## 2012-12-26 ENCOUNTER — Other Ambulatory Visit: Payer: Self-pay | Admitting: Oncology

## 2013-02-22 ENCOUNTER — Ambulatory Visit
Admission: RE | Admit: 2013-02-22 | Discharge: 2013-02-22 | Disposition: A | Discharge status: Home / Self Care | Payer: BC Managed Care – PPO | Source: Ambulatory Visit | Attending: Gastroenterology | Admitting: Gastroenterology

## 2013-02-22 ENCOUNTER — Other Ambulatory Visit: Payer: Self-pay | Admitting: Gastroenterology

## 2013-02-22 DIAGNOSIS — Q438 Other specified congenital malformations of intestine: Secondary | ICD-10-CM

## 2013-03-07 ENCOUNTER — Other Ambulatory Visit: Payer: Self-pay | Admitting: Oncology

## 2013-03-07 DIAGNOSIS — C50919 Malignant neoplasm of unspecified site of unspecified female breast: Secondary | ICD-10-CM

## 2013-07-13 ENCOUNTER — Telehealth: Payer: Self-pay | Admitting: *Deleted

## 2013-07-13 ENCOUNTER — Other Ambulatory Visit: Payer: Self-pay | Admitting: *Deleted

## 2013-07-13 DIAGNOSIS — M7989 Other specified soft tissue disorders: Secondary | ICD-10-CM | POA: Insufficient documentation

## 2013-07-13 DIAGNOSIS — C50919 Malignant neoplasm of unspecified site of unspecified female breast: Secondary | ICD-10-CM

## 2013-07-13 NOTE — Telephone Encounter (Signed)
This RN spoke with pt per her call stating need for new prescription for compression sleeve and glove to be sent [facility].  Shenna also states ongoing left ankle swelling.  Per discussion Nana states swelling has been ongoing but more noticeable since starting on the tamoxifen.  Plan per call is order for compression sleeve and glove will be sent to store- Pt will hold tamoxifen at present, doppler study will be obtained ASAP for further follow up.  This RN called to Vascular and obtained an appointment for 2pm tomorrow at [facility].  Called and informed pt including entrance best from the [facility] with valet parking and check in at that location.

## 2013-07-14 ENCOUNTER — Ambulatory Visit (HOSPITAL_COMMUNITY)
Admission: RE | Admit: 2013-07-14 | Discharge: 2013-07-14 | Disposition: A | Discharge status: Home / Self Care | Payer: BC Managed Care – PPO | Source: Ambulatory Visit | Attending: Oncology | Admitting: Oncology

## 2013-07-14 DIAGNOSIS — M7989 Other specified soft tissue disorders: Secondary | ICD-10-CM

## 2013-07-14 DIAGNOSIS — Z853 Personal history of malignant neoplasm of breast: Secondary | ICD-10-CM | POA: Insufficient documentation

## 2013-07-14 DIAGNOSIS — C50919 Malignant neoplasm of unspecified site of unspecified female breast: Secondary | ICD-10-CM

## 2013-07-14 NOTE — Progress Notes (Signed)
VASCULAR LAB PRELIMINARY  PRELIMINARY  PRELIMINARY  PRELIMINARY  Left lower extremity venous duplex completed.    Preliminary report:  Left:  No evidence of DVT, superficial thrombosis, or Baker's cyst.  Simonne Boulos, RVT 07/14/2013, 3:33 PM

## 2013-07-15 ENCOUNTER — Telehealth: Payer: Self-pay | Admitting: *Deleted

## 2013-07-15 NOTE — Telephone Encounter (Signed)
This RN spoke with pt today per result of doppler negative for DVT or other venous issues.  Per discussion this RN informed her recommendation to resume tamoxifen with pt stating concern that though good result from doppler could the tamoxifen be causing swelling. Per conversation plan is pt will hold the tamoxifen for 2 weeks and call with status update.

## 2013-11-12 ENCOUNTER — Telehealth: Payer: Self-pay | Admitting: *Deleted

## 2013-11-12 NOTE — Telephone Encounter (Signed)
Lm informed the pt that GCM will be on call 12/30/13. gv appt for 01/03/14 w/labs@ 1pm and ov@ 1:30pm. Made pt aware that i will mail a letter/avs...td

## 2013-11-16 ENCOUNTER — Other Ambulatory Visit: Payer: Self-pay | Admitting: Oncology

## 2013-11-16 DIAGNOSIS — Z853 Personal history of malignant neoplasm of breast: Secondary | ICD-10-CM

## 2013-12-16 ENCOUNTER — Other Ambulatory Visit: Payer: BC Managed Care – PPO

## 2013-12-22 ENCOUNTER — Other Ambulatory Visit: Payer: Self-pay | Admitting: Oncology

## 2013-12-22 ENCOUNTER — Other Ambulatory Visit: Payer: Self-pay

## 2013-12-22 DIAGNOSIS — Z853 Personal history of malignant neoplasm of breast: Secondary | ICD-10-CM

## 2013-12-23 ENCOUNTER — Ambulatory Visit: Payer: BC Managed Care – PPO | Admitting: Oncology

## 2013-12-23 ENCOUNTER — Other Ambulatory Visit: Payer: BC Managed Care – PPO

## 2013-12-24 ENCOUNTER — Ambulatory Visit
Admission: RE | Admit: 2013-12-24 | Discharge: 2013-12-24 | Disposition: A | Discharge status: Home / Self Care | Payer: BC Managed Care – PPO | Source: Ambulatory Visit | Attending: Oncology | Admitting: Oncology

## 2013-12-24 DIAGNOSIS — Z853 Personal history of malignant neoplasm of breast: Secondary | ICD-10-CM

## 2013-12-30 ENCOUNTER — Ambulatory Visit: Payer: BC Managed Care – PPO | Admitting: Oncology

## 2014-01-03 ENCOUNTER — Other Ambulatory Visit (HOSPITAL_BASED_OUTPATIENT_CLINIC_OR_DEPARTMENT_OTHER): Payer: BC Managed Care – PPO

## 2014-01-03 ENCOUNTER — Other Ambulatory Visit: Payer: Self-pay | Admitting: *Deleted

## 2014-01-03 ENCOUNTER — Ambulatory Visit (HOSPITAL_BASED_OUTPATIENT_CLINIC_OR_DEPARTMENT_OTHER): Payer: BC Managed Care – PPO | Admitting: Oncology

## 2014-01-03 ENCOUNTER — Telehealth: Payer: Self-pay | Admitting: Oncology

## 2014-01-03 VITALS — BP 131/76 | HR 77 | Temp 97.8°F | Resp 18 | Ht 65.0 in | Wt 184.5 lb

## 2014-01-03 DIAGNOSIS — C50219 Malignant neoplasm of upper-inner quadrant of unspecified female breast: Secondary | ICD-10-CM

## 2014-01-03 DIAGNOSIS — Z170 Estrogen receptor positive status [ER+]: Secondary | ICD-10-CM

## 2014-01-03 DIAGNOSIS — C50211 Malignant neoplasm of upper-inner quadrant of right female breast: Secondary | ICD-10-CM | POA: Insufficient documentation

## 2014-01-03 DIAGNOSIS — C50919 Malignant neoplasm of unspecified site of unspecified female breast: Secondary | ICD-10-CM

## 2014-01-03 DIAGNOSIS — C50911 Malignant neoplasm of unspecified site of right female breast: Secondary | ICD-10-CM

## 2014-01-03 LAB — COMPREHENSIVE METABOLIC PANEL (CC13)
ALBUMIN: 3.9 g/dL (ref 3.5–5.0)
ALT: 17 U/L (ref 0–55)
ANION GAP: 10 meq/L (ref 3–11)
AST: 24 U/L (ref 5–34)
Alkaline Phosphatase: 48 U/L (ref 40–150)
BUN: 10.4 mg/dL (ref 7.0–26.0)
CALCIUM: 9.5 mg/dL (ref 8.4–10.4)
CHLORIDE: 107 meq/L (ref 98–109)
CO2: 26 meq/L (ref 22–29)
CREATININE: 0.8 mg/dL (ref 0.6–1.1)
Glucose: 91 mg/dl (ref 70–140)
POTASSIUM: 4.1 meq/L (ref 3.5–5.1)
SODIUM: 143 meq/L (ref 136–145)
TOTAL PROTEIN: 6.6 g/dL (ref 6.4–8.3)
Total Bilirubin: 0.43 mg/dL (ref 0.20–1.20)

## 2014-01-03 LAB — CBC WITH DIFFERENTIAL/PLATELET
BASO%: 0.9 % (ref 0.0–2.0)
Basophils Absolute: 0 10*3/uL (ref 0.0–0.1)
EOS%: 1.9 % (ref 0.0–7.0)
Eosinophils Absolute: 0.1 10*3/uL (ref 0.0–0.5)
HEMATOCRIT: 36 % (ref 34.8–46.6)
HGB: 11.9 g/dL (ref 11.6–15.9)
LYMPH#: 1.2 10*3/uL (ref 0.9–3.3)
LYMPH%: 24.1 % (ref 14.0–49.7)
MCH: 28.8 pg (ref 25.1–34.0)
MCHC: 33.2 g/dL (ref 31.5–36.0)
MCV: 86.9 fL (ref 79.5–101.0)
MONO#: 0.4 10*3/uL (ref 0.1–0.9)
MONO%: 7.6 % (ref 0.0–14.0)
NEUT#: 3.4 10*3/uL (ref 1.5–6.5)
NEUT%: 65.5 % (ref 38.4–76.8)
Platelets: 267 10*3/uL (ref 145–400)
RBC: 4.14 10*6/uL (ref 3.70–5.45)
RDW: 13.5 % (ref 11.2–14.5)
WBC: 5.1 10*3/uL (ref 3.9–10.3)

## 2014-01-03 MED ORDER — TAMOXIFEN CITRATE 20 MG PO TABS: 20.0000 mg | ORAL_TABLET | Freq: Every day | ORAL | Status: DC

## 2014-01-03 NOTE — Progress Notes (Signed)
ID: Savannah [name]   DOB: 12-Feb-1956  MR#: [id number]  SWF#:[id number]  PCP: Horton Finer, MD GYN: Dory Horn SU: Thomas Cornett OTHER MD: Ledon Snare, Almedia Balls   HISTORY OF PRESENT ILLNESS: (cf Dr Johny Shears note 07/01/07)  The patient had an abnormal screening mammogram on January 22, 2007.  She returned on March 6 for repeat compression views of the right breast as well as ultrasound.  The compression views confirmed the presence of an ill-defined increased density mass with spiculated margins.  Ultrasound failed to demonstrate any abnormality in this region.  The patient returned on March 17 for breast MRI.  This showed a bilobed irregularly enhancing mass measuring 1.4 cm.  Since the MRI showed the tumor best, the patient underwent MRI-guided breast biopsy on March 31.  This showed invasive mammary carcinoma.  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  Three lymph nodes were sampled and one contained a macroscopic deposit of metastatic disease with a small amount of extracapsular extension.  The patient met with Dr. Eston Esters.  She underwent oncotype testing with an overall score of 18 indicating 11% recurrence rate for node negative patients.  Based on this, she was leaning away from systemic chemotherapy, pending completion axillary dissection.  She also underwent complete staging with CT scans of the chest, abdomen, and pelvis on May 30 as well as bone scan on May 30.  The bone scans showed arthritic changes at the L5-S1 level.  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  The patient proceeded to actual lymph node dissection on June 24.  Dr. Brantley Stage was able to remove 13 additional lymph nodes and these were all [name] of metastatic involvement.  In followup with Dr. Truddie Coco, Ms. [place] elected  to proceed with oophorectomy.  This was performed by Dr. Dory Horn on July 15.  Her subsequent history is as detailed below.  INTERVAL HISTORY: Savannah [name] returns today for followup of her breast cancer. The interval history is generally unremarkable. She did are "finding your new normal" brochures photographs. She has recently joined [employer] and has upped her exercise program. She goes to the gym a couple of times a week and tries to walk daily. She is of course on a good diet as well  REVIEW OF SYSTEMS: She received a flu shot in her left arm rather high and had quite a bit of pain following that. She saw Dr. Lynann Bologna who felt Savannah [name] had developed a capsulitis and gave her a steroid shots. She still has some limitation of motion in the left shoulder area. Sometimes her ankles swell. Her hot flashes are perhaps a little better. Otherwise a detailed review of systems today was noncontributory  PAST MEDICAL HISTORY: No past medical history on file. History of fibromyalgia, remote history of malaria, history of shingles, history of endometriosis, remote history of vascular headaches, history of breast cancer  PAST SURGICAL HISTORY: No past surgical history on file. Status post right lumpectomy and full axillary lymph node dissection, status post hysterectomy with bilateral stopping the oophorectomy, status post cholecystectomy, status post tonsillectomy and adenoidectomy.  FAMILY HISTORY No family history on file. The patient's father died at age 17 from a heart attack. The patient's mother died at age 85 from colon cancer, which had been diagnosed less than  6 months before. Savannah [name] and has a brother who is Magazine features editor of the [employer], broadcasting/film/video at [employer]. He lives in [place]. She had no sisters. There is no history of breast or ovarian cancer in the family.  GYNECOLOGIC HISTORY: Menarche age 79, she is GX P0. She had her hysterectomy in March of 2008. She never took hormone replacement. She  did take birth control pills between 1990 and 2008, with no clotting or other complications.  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 She is single and lives by herself, with no pets.   ADVANCED DIRECTIVES: In place. She has named her friend Caleen Jobs as her healthcare power of attorney. : Evelyn's cell number is [phone].  HEALTH MAINTENANCE: History  Substance Use Topics  . Smoking status: Not on file  . Smokeless tobacco: Not on file  . Alcohol Use: Not on file     Colonoscopy: due/ "[place] MD" is scheduling  PAP: s/p hysterectomy  Bone density: Dr Nori Riis has scheduled  Lipid panel:  Allergies  Allergen Reactions  . Darvon Itching and Nausea Only    Current Outpatient Prescriptions  Medication Sig Dispense Refill  . calcium-vitamin D (OSCAL WITH D) 500-200 MG-UNIT per tablet Take 1 tablet by mouth daily. Pt. Not sure of dose      . cholecalciferol (VITAMIN D) 1000 UNITS tablet Take 1,000 Units by mouth daily.      . cyclobenzaprine (FLEXERIL) 10 MG tablet Take 10 mg by mouth daily as needed.      . diphenhydramine-acetaminophen (TYLENOL PM) 25-500 MG TABS Take 1 tablet by mouth at bedtime as needed.      . famotidine (PEPCID) 20 MG tablet Take 20 mg by mouth 2 (two) times daily.      . Multiple Vitamin (MULTIVITAMIN) tablet Take 1 tablet by mouth daily.      . tamoxifen (NOLVADEX) 20 MG tablet TAKE 1 TABLET BY MOUTH DAILY  90 tablet  3  . vitamin E 200 UNIT capsule Take 200 Units by mouth 2 (two) times daily. Pt. Not sure of dose       No current facility-administered medications for this visit.    OBJECTIVE: Middle-aged white woman in no acute distress Filed Vitals:   01/03/14 1311  BP: 131/76  Pulse: 77  Temp: 97.8 F (36.6 C)  Resp: 18     Body mass index is 30.7 kg/(m^2).    ECOG FS: 1  Sclerae unicteric, pupils equal and round Oropharynx clear and moist No cervical or supraclavicular  adenopathy Lungs no rales or rhonchi Heart regular rate and rhythm Abd soft, nontender, positive bowel sounds MSK no focal spinal tenderness, no upper extremity lymphedema Neuro: nonfocal, well oriented, positive affect Breasts: The right breast is status post lumpectomy and radiation. There is no evidence of local recurrence. The right axilla is benign. The left breast is unremarkable.   LAB RESULTS: Lab Results  Component Value Date   WBC 5.1 01/03/2014   NEUTROABS 3.4 01/03/2014   HGB 11.9 01/03/2014   HCT 36.0 01/03/2014   MCV 86.9 01/03/2014   PLT 267 01/03/2014      Chemistry      Component Value Date/Time   NA 140 12/24/2012 1401   NA 140 10/21/2011 1342   K 4.0 12/24/2012 1401   K 4.0 10/21/2011 1342   CL 106 12/24/2012 1401   CL 104 10/21/2011 1342   CO2 25 12/24/2012 1401  CO2 26 10/21/2011 1342   BUN 14.0 12/24/2012 1401   BUN 13 10/21/2011 1342   CREATININE 0.7 12/24/2012 1401   CREATININE 0.67 10/21/2011 1342      Component Value Date/Time   CALCIUM 9.3 12/24/2012 1401   CALCIUM 9.7 10/21/2011 1342   ALKPHOS 56 12/24/2012 1401   ALKPHOS 43 10/21/2011 1342   AST 20 12/24/2012 1401   AST 21 10/21/2011 1342   ALT 19 12/24/2012 1401   ALT 14 10/21/2011 1342   BILITOT 0.29 12/24/2012 1401   BILITOT 0.2* 10/21/2011 1342       Lab Results  Component Value Date   LABCA2 10 12/24/2012    No components found with this basename: [id number]    No results found for this basename: INR,  in the last 168 hours  Urinalysis    Component Value Date/Time   COLORURINE YELLOW 06/21/2007 [place] 06/21/2007 1029   LABSPEC 1.010 06/21/2007 1029   PHURINE 6.0 06/21/2007 1029   GLUCOSEU NEGATIVE 06/21/2007 1029   HGBUR MODERATE* 06/21/2007 [place] 06/21/2007 [place] 06/21/2007 1029   PROTEINUR NEGATIVE 06/21/2007 1029   UROBILINOGEN 0.2 06/21/2007 1029   NITRITE NEGATIVE 06/21/2007 [place] 06/21/2007 1029     STUDIES: Mm Diag Breast Tomo Bilateral  12/24/2013   CLINICAL DATA:  Status post right lumpectomy and radiation therapy for breast cancer in 2008.  EXAM: DIGITAL DIAGNOSTIC BILATERAL MAMMOGRAM WITH TOMOSYNTHESIS AND CAD  COMPARISON:  Previous examinations.  ACR Breast Density Category b: There are scattered areas of fibroglandular density.  FINDINGS: Stable post lumpectomy changes on the right with fat necrosis and oil cyst formation. No new findings suspicious for malignancy in either breast.  Mammographic images were processed with CAD.  IMPRESSION: No evidence of malignancy.  RECOMMENDATION: Bilateral screening mammogram in 1 year.  I have discussed the findings and recommendations with the patient. Results were also provided in writing at the conclusion of the visit.  BI-RADS CATEGORY  2: Benign finding(s).   Electronically Signed   By: Enrique Sack M.D.   On: 12/24/2013 11:43   ASSESSMENT: 58 y.o. [place] woman  (1) status post right lumpectomy and sentinel lymph node sampling Apr 10 2007 for a pT1b pN1, stage IIA invasive ductal carcinoma, grade 1, estrogen receptor 95% and progesterone receptor 94% positive, with an MIB-1 of 13%, and no evidence of HER-2 amplification  (2) full axillary dissection 05/26/2007 showed an additional 13 lymph nodes to be negative.  (3) oncotype score of 18 indicating 11 % recurrence rate if her only adjuvant systemic treatment was tamoxifen for 5 years  (3) adjuvant radiation completed 09/01/2007  (4) status post total abdominal hysterectomy with bilateral salpingo-oophorectomy 06/16/2007, with benign pathology.  (5) on anastrozole and exemestane for about 6 months total, with poor tolerance; on tamoxifen as of February 2009  PLAN: Savannah [name] is doing fine from a breast cancer point of view. The problems she is having in the left shoulder of course are not cancer related. Nevertheless I did suggest she could benefit from some physical therapy. She is a very  high to Dr. Nevin Bloodgood present, but she will think about it and at the appropriate time we can make the referral.  I supported her plans for diet and exercise the target of course being 45 minutes of exercise for at least 5 days a week. She think about the died is to cut back on the carbohydrates and increase  the vegetables.  As far as breast cancer is concerned the overall plan is to continue tamoxifen for total of 10 years, which will take [place] to February of 2019. She will see [place] on a yearly basis between now and then. She knows to call for any problems that may develop before her next visit here.   MAGRINAT,GUSTAV C    01/03/2014

## 2014-01-03 NOTE — Telephone Encounter (Signed)
, °

## 2014-02-03 ENCOUNTER — Ambulatory Visit (INDEPENDENT_AMBULATORY_CARE_PROVIDER_SITE_OTHER): Payer: BC Managed Care – PPO | Admitting: Podiatry

## 2014-02-03 ENCOUNTER — Encounter: Payer: Self-pay | Admitting: Podiatry

## 2014-02-03 VITALS — BP 107/61 | HR 83 | Resp 16

## 2014-02-03 DIAGNOSIS — M779 Enthesopathy, unspecified: Secondary | ICD-10-CM

## 2014-02-03 NOTE — Progress Notes (Signed)
Subjective:     Patient ID: Savannah [name], female   DOB: Apr 15, 1956, 58 y.o.   MRN: [id number]  HPI patient states I would like to get new orthotics because of the pain I'm experiencing on my left foot   Review of Systems     Objective:   Physical Exam Neurovascular status intact with no health history changes noted and discomfort third metatarsophalangeal joint left foot it continues to be sore    Assessment:     Capsulitis left foot with inflammation    Plan:     Scanned for customized orthotics and went ahead and dispensed metatarsal pads to reduce stress against the joint surface. Reappoint when orthotics returned

## 2014-02-17 ENCOUNTER — Other Ambulatory Visit: Payer: BC Managed Care – PPO

## 2014-02-17 DIAGNOSIS — M779 Enthesopathy, unspecified: Secondary | ICD-10-CM

## 2014-03-11 ENCOUNTER — Ambulatory Visit (INDEPENDENT_AMBULATORY_CARE_PROVIDER_SITE_OTHER): Payer: BC Managed Care – PPO | Admitting: *Deleted

## 2014-03-11 DIAGNOSIS — M779 Enthesopathy, unspecified: Secondary | ICD-10-CM

## 2014-03-11 NOTE — Patient Instructions (Signed)

## 2014-03-11 NOTE — Progress Notes (Signed)
Dispensed patient's orthotics with oral and written instructions for wearing. Patient will follow up with Dr. Regal in 1 month for an orthotic check. 

## 2014-03-17 ENCOUNTER — Encounter: Payer: Self-pay | Admitting: Podiatry

## 2014-03-17 ENCOUNTER — Other Ambulatory Visit: Payer: Self-pay | Admitting: Podiatry

## 2014-03-17 ENCOUNTER — Ambulatory Visit (INDEPENDENT_AMBULATORY_CARE_PROVIDER_SITE_OTHER): Payer: BC Managed Care – PPO | Admitting: Podiatry

## 2014-03-17 VITALS — BP 106/66 | HR 80 | Resp 16

## 2014-03-17 DIAGNOSIS — M779 Enthesopathy, unspecified: Secondary | ICD-10-CM

## 2014-03-17 MED ORDER — MELOXICAM 15 MG PO TABS: 15.0000 mg | ORAL_TABLET | Freq: Every day | ORAL | Status: DC

## 2014-03-17 MED ORDER — TRIAMCINOLONE ACETONIDE 10 MG/ML IJ SUSP
10.0000 mg | Freq: Once | INTRAMUSCULAR | Status: AC
  Administered 2014-03-17: 10 mg

## 2014-03-17 NOTE — Progress Notes (Signed)
Subjective:     Patient ID: Savannah [name], female   DOB: 02-13-1956, 58 y.o.   MRN: [id number]  HPI patient presents with pain that is improving but still present in the heel region   Review of Systems     Objective:   Physical Exam Neurovascular status intact with diminishment of discomfort but still painful when pressed into the tendon complex    Assessment:     Continuation of tendinitis with inflammation noted left foot    Plan:     Reinjected the tendon 3 mg Kenalog 5 of Xylocaine Marcaine mixture and advised on physical therapy

## 2014-04-13 ENCOUNTER — Ambulatory Visit (INDEPENDENT_AMBULATORY_CARE_PROVIDER_SITE_OTHER): Payer: BC Managed Care – PPO | Admitting: Podiatry

## 2014-04-13 ENCOUNTER — Encounter: Payer: Self-pay | Admitting: Podiatry

## 2014-04-13 VITALS — BP 112/70 | HR 96 | Resp 16

## 2014-04-13 DIAGNOSIS — M779 Enthesopathy, unspecified: Secondary | ICD-10-CM

## 2014-04-13 NOTE — Progress Notes (Signed)
Subjective:     Patient ID: Savannah [name], female   DOB: 12-07-1955, 58 y.o.   MRN: [id number]  HPI patient states that the pain is still present but improved quite a bit from previous visit   Review of Systems     Objective:   Physical Exam Neurovascular status intact with significant diminishment of discomfort plantar aspect heel and arch upon palpation and also significant reduction but still pain third metatarsal phalangeal joint of left foot    Assessment:     Capsulitis is noted third metatarsophalangeal joint left    Plan:     Continue orthotics and dispensed graphite insole to try to reduce weightbearing on this joint. May require more advanced treatment if symptoms persist

## 2014-05-16 ENCOUNTER — Ambulatory Visit: Payer: BC Managed Care – PPO | Admitting: Podiatry

## 2014-09-16 ENCOUNTER — Other Ambulatory Visit: Payer: Self-pay

## 2015-01-04 ENCOUNTER — Other Ambulatory Visit: Payer: Self-pay | Admitting: *Deleted

## 2015-01-04 DIAGNOSIS — C50911 Malignant neoplasm of unspecified site of right female breast: Secondary | ICD-10-CM

## 2015-01-05 ENCOUNTER — Other Ambulatory Visit (HOSPITAL_BASED_OUTPATIENT_CLINIC_OR_DEPARTMENT_OTHER): Payer: 59

## 2015-01-05 DIAGNOSIS — C50211 Malignant neoplasm of upper-inner quadrant of right female breast: Secondary | ICD-10-CM

## 2015-01-05 DIAGNOSIS — C50911 Malignant neoplasm of unspecified site of right female breast: Secondary | ICD-10-CM

## 2015-01-05 LAB — COMPREHENSIVE METABOLIC PANEL (CC13)
ALBUMIN: 3.7 g/dL (ref 3.5–5.0)
ALT: 19 U/L (ref 0–55)
ANION GAP: 10 meq/L (ref 3–11)
AST: 23 U/L (ref 5–34)
Alkaline Phosphatase: 47 U/L (ref 40–150)
BILIRUBIN TOTAL: 0.32 mg/dL (ref 0.20–1.20)
BUN: 12.5 mg/dL (ref 7.0–26.0)
CALCIUM: 9.1 mg/dL (ref 8.4–10.4)
CO2: 26 mEq/L (ref 22–29)
CREATININE: 0.8 mg/dL (ref 0.6–1.1)
Chloride: 107 mEq/L (ref 98–109)
EGFR: 83 mL/min/{1.73_m2} — ABNORMAL LOW (ref >90)
Glucose: 97 mg/dl (ref 70–140)
POTASSIUM: 4.1 meq/L (ref 3.5–5.1)
SODIUM: 143 meq/L (ref 136–145)
Total Protein: 6.5 g/dL (ref 6.4–8.3)

## 2015-01-05 LAB — CBC WITH DIFFERENTIAL/PLATELET
BASO%: 1.2 % (ref 0.0–2.0)
BASOS ABS: 0.1 10*3/uL (ref 0.0–0.1)
EOS ABS: 0.1 10*3/uL (ref 0.0–0.5)
EOS%: 2.3 % (ref 0.0–7.0)
HCT: 38.1 % (ref 34.8–46.6)
HEMOGLOBIN: 12.4 g/dL (ref 11.6–15.9)
LYMPH%: 24.4 % (ref 14.0–49.7)
MCH: 28.6 pg (ref 25.1–34.0)
MCHC: 32.5 g/dL (ref 31.5–36.0)
MCV: 88 fL (ref 79.5–101.0)
MONO#: 0.4 10*3/uL (ref 0.1–0.9)
MONO%: 9.6 % (ref 0.0–14.0)
NEUT#: 2.7 10*3/uL (ref 1.5–6.5)
NEUT%: 62.5 % (ref 38.4–76.8)
PLATELETS: 247 10*3/uL (ref 145–400)
RBC: 4.33 10*6/uL (ref 3.70–5.45)
RDW: 13.4 % (ref 11.2–14.5)
WBC: 4.3 10*3/uL (ref 3.9–10.3)
lymph#: 1 10*3/uL (ref 0.9–3.3)

## 2015-01-12 ENCOUNTER — Telehealth: Payer: Self-pay | Admitting: Oncology

## 2015-01-12 ENCOUNTER — Ambulatory Visit (HOSPITAL_BASED_OUTPATIENT_CLINIC_OR_DEPARTMENT_OTHER): Payer: 59 | Admitting: Oncology

## 2015-01-12 ENCOUNTER — Other Ambulatory Visit: Payer: Self-pay

## 2015-01-12 VITALS — BP 121/70 | HR 76 | Temp 98.4°F | Resp 18 | Ht 65.0 in | Wt 180.2 lb

## 2015-01-12 DIAGNOSIS — N393 Stress incontinence (female) (male): Secondary | ICD-10-CM

## 2015-01-12 DIAGNOSIS — C50911 Malignant neoplasm of unspecified site of right female breast: Secondary | ICD-10-CM

## 2015-01-12 DIAGNOSIS — Z1231 Encounter for screening mammogram for malignant neoplasm of breast: Secondary | ICD-10-CM

## 2015-01-12 DIAGNOSIS — C50211 Malignant neoplasm of upper-inner quadrant of right female breast: Secondary | ICD-10-CM

## 2015-01-12 DIAGNOSIS — Z800 Family history of malignant neoplasm of digestive organs: Secondary | ICD-10-CM

## 2015-01-12 NOTE — Addendum Note (Signed)
Addended by: Laureen Abrahams on: 01/12/2015 05:38 PM   Modules accepted: Orders, Medications

## 2015-01-12 NOTE — Progress Notes (Signed)
ID: Savannah [name]   DOB: 1956/10/02  MR#: [id number]  ZSW#:[id number]  PCP: Horton Finer, MD GYN: Dory Horn SU: Erroll Luna OTHER MD: Ledon Snare, Almedia Balls, Dorena Cookey   HISTORY OF PRESENT ILLNESS: (cf Dr Johny Shears note 07/01/07)   The patient had an abnormal screening mammogram on January 22, 2007.  She returned on March 6 for repeat compression views of the right breast as well as ultrasound.  The compression views confirmed the presence of an ill-defined increased density mass with spiculated margins.  Ultrasound failed to demonstrate any abnormality in this region.  The patient returned on March 17 for breast MRI.  This showed a bilobed irregularly enhancing mass measuring 1.4 cm.  Since the MRI showed the tumor best, the patient underwent MRI-guided breast biopsy on March 31.  This showed invasive mammary carcinoma.  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  Three lymph nodes were sampled and one contained a macroscopic deposit of metastatic disease with a small amount of extracapsular extension.  The patient met with Dr. Eston Esters.  She underwent oncotype testing with an overall score of 18 indicating 11% recurrence rate for node negative patients.  Based on this, she was leaning away from systemic chemotherapy, pending completion axillary dissection.  She also underwent complete staging with CT scans of the chest, abdomen, and pelvis on May 30 as well as bone scan on May 30.  The bone scans showed arthritic changes at the L5-S1 level.  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  The patient proceeded to actual lymph node dissection on June 24.  Dr. Brantley Stage was able to remove 13 additional lymph nodes and these were all [name] of metastatic involvement.  In followup with Dr. Truddie Coco, Ms.  Alessandrini elected to proceed with oophorectomy.  This was performed by Dr. Dory Horn on July 15.  Her subsequent history is as detailed below.  INTERVAL HISTORY: Savannah [name] returns today for followup of her breast cancer. The interval history is generally unremarkable. She continues on tamoxifen with excellent tolerance. Vaginal dryness is not a major issue. She does not have vaginal discharge problems. She has mild hot flashes which do not keep her up at night. She obtains a drug at a very good price.  REVIEW OF SYSTEMS: She is having more knee pain. She has switched to the elliptical and that helps. She is aware that she can also do swimming and stationary biking which are also knee sparing. Sometimes her ankles swell. This could have been with the salt indiscretion, or if she stands for a long time all, or if she flies. She is aware of how to use compression stockings. She has mild heartburn problems. She has overflow stress urinary incontinence. She has joint pain and arthritis which is not more persistent or intense than prior. Overall a detailed review of systems today was noncontributory except as noted  PAST MEDICAL HISTORY: No past medical history on file. History of fibromyalgia, remote history of malaria, history of shingles, history of endometriosis, remote history of vascular headaches, history of breast cancer  PAST SURGICAL HISTORY: No past surgical history on file. Status post right lumpectomy and full axillary lymph node dissection, status post hysterectomy with bilateral stopping the oophorectomy, status post cholecystectomy, status post tonsillectomy and adenoidectomy.  FAMILY  HISTORY No family history on file. The patient's father died at age 96 from a heart attack. The patient's mother died at age 70 from colon cancer, which had been diagnosed less than 6 months before. Savannah [name] and has a brother who is Magazine features editor of the [employer], broadcasting/film/video at [employer]. He lives in [place]. She had  no sisters. There is no history of breast or ovarian cancer in the family.  GYNECOLOGIC HISTORY: Menarche age 59, she is GX P0. She had her hysterectomy in March of 2008. She never took hormone replacement. She did take birth control pills between 1990 and 2008, with no clotting or other complications.  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 She is single and lives by herself, with no pets.   ADVANCED DIRECTIVES: In place. She has named her friend Caleen Jobs as her healthcare power of attorney. : Evelyn's cell number is [phone].  HEALTH MAINTENANCE: History  Substance Use Topics  . Smoking status: Never Smoker   . Smokeless tobacco: Not on file  . Alcohol Use: Not on file     Colonoscopy: She has a tortuous colon and generally requires a virtual colonoscopy  PAP: s/p hysterectomy and bilateral salpingo-oophorectomy  Bone density:   Lipid panel:  Allergies  Allergen Reactions  . Darvon Itching and Nausea Only    Current Outpatient Prescriptions  Medication Sig Dispense Refill  . cholecalciferol (VITAMIN D) 1000 UNITS tablet Take 1,000 Units by mouth daily.    . cyclobenzaprine (FLEXERIL) 10 MG tablet Take 10 mg by mouth daily as needed.    . diphenhydramine-acetaminophen (TYLENOL PM) 25-500 MG TABS Take 1 tablet by mouth at bedtime as needed.    . famotidine (PEPCID) 20 MG tablet Take 20 mg by mouth 2 (two) times daily.    . meloxicam (MOBIC) 15 MG tablet TAKE 1 TABLET BY MOUTH EVERY DAY 90 tablet 2  . Multiple Vitamin (MULTIVITAMIN) tablet Take 1 tablet by mouth daily.    . Omega-3 Fatty Acids (FISH OIL PO) Take by mouth.    . tamoxifen (NOLVADEX) 20 MG tablet Take 1 tablet (20 mg total) by mouth daily. 90 tablet 4  . vitamin E 200 UNIT capsule Take 200 Units by mouth 2 (two) times daily. Pt. Not sure of dose     No current facility-administered medications for this visit.    OBJECTIVE: Middle-aged white  woman who appears stated age 59 Vitals:   01/12/15 1121  BP: 121/70  Pulse: 76  Temp: 98.4 F (36.9 C)  Resp: 18     Body mass index is 29.99 kg/(m^2).    ECOG FS: 1  Sclerae unicteric, EOMs intact Oropharynx clear, teeth in good repair No cervical or supraclavicular adenopathy Lungs no rales or rhonchi Heart regular rate and rhythm Abd soft, nontender, positive bowel sounds MSK no focal spinal tenderness, minimal right upper extremity lymphedema Neuro: nonfocal, well oriented, positive affect Breasts: The right breast is status post lumpectomy and radiation. There is no evidence of local recurrence. There is an area on the underside of the breast which seems a little bit thicker to her. I would not have remarked on it otherwise The right axilla is benign. The left breast is unremarkable.   LAB RESULTS: Lab Results  Component Value Date   WBC 4.3 01/05/2015   NEUTROABS 2.7 01/05/2015   HGB 12.4 01/05/2015   HCT 38.1 01/05/2015   MCV 88.0 01/05/2015   PLT 247 01/05/2015  Chemistry      Component Value Date/Time   NA 143 01/05/2015 1107   NA 140 10/21/2011 1342   K 4.1 01/05/2015 1107   K 4.0 10/21/2011 1342   CL 106 12/24/2012 1401   CL 104 10/21/2011 1342   CO2 26 01/05/2015 1107   CO2 26 10/21/2011 1342   BUN 12.5 01/05/2015 1107   BUN 13 10/21/2011 1342   CREATININE 0.8 01/05/2015 1107   CREATININE 0.67 10/21/2011 1342      Component Value Date/Time   CALCIUM 9.1 01/05/2015 1107   CALCIUM 9.7 10/21/2011 1342   ALKPHOS 47 01/05/2015 1107   ALKPHOS 43 10/21/2011 1342   AST 23 01/05/2015 1107   AST 21 10/21/2011 1342   ALT 19 01/05/2015 1107   ALT 14 10/21/2011 1342   BILITOT 0.32 01/05/2015 1107   BILITOT 0.2* 10/21/2011 1342       Lab Results  Component Value Date   LABCA2 10 12/24/2012    No components found for: [id number]  No results for input(s): INR in the last 168 hours.  Urinalysis    Component Value Date/Time   COLORURINE YELLOW  06/21/2007 1029   APPEARANCEUR CLEAR 06/21/2007 1029   LABSPEC 1.010 06/21/2007 1029   PHURINE 6.0 06/21/2007 1029   GLUCOSEU NEGATIVE 06/21/2007 1029   HGBUR MODERATE* 06/21/2007 1029   BILIRUBINUR NEGATIVE 06/21/2007 1029   KETONESUR NEGATIVE 06/21/2007 1029   PROTEINUR NEGATIVE 06/21/2007 1029   UROBILINOGEN 0.2 06/21/2007 1029   NITRITE NEGATIVE 06/21/2007 1029   LEUKOCYTESUR NEGATIVE 06/21/2007 1029    STUDIES: She missed her January date for mammography this year.  ASSESSMENT: 59 y.o. [place] woman  (1) status post right lumpectomy and sentinel lymph node sampling Apr 10 2007 for a pT1b pN1, stage IIA invasive ductal carcinoma, grade 1, estrogen receptor 95% and progesterone receptor 94% positive, with an MIB-1 of 13%, and no evidence of HER-2 amplification  (2) full axillary dissection 05/26/2007 showed an additional 13 lymph nodes to be negative.  (3) oncotype score of 18 indicating 11 % recurrence rate if her only adjuvant systemic treatment was tamoxifen for 5 years  (3) adjuvant radiation completed 09/01/2007  (4) status post total abdominal hysterectomy with bilateral salpingo-oophorectomy 06/16/2007, with benign pathology.  (5) on anastrozole and exemestane for about 6 months total, with poor tolerance; on tamoxifen as of February 2009  PLAN:  Savannah [name] is doing terrific from a breast cancer point of view, and the plan is to continue tamoxifen to February 2019. She is generally tolerating this well, and her hot flashes currently do not require intervention.  She needs to get her mammogram up-to-date and I have put those orders in. It should be done some time within the week.  We discussed her stress urinary incontinence which seems to be due to overflow incontinence and not to spasms. I suggested she void every 2 hours and see if she can get her bladder retrained that way.  We also discussed her ankle swelling which I think is going to be due to valvular incompetence  in the lower leg veins. I suggested she try some compression stockings. She also needs a new right upper extremity compression sleeve and compression glove and I wrote those prescriptions for her.  She is doing wonderful with exercise, and has found her way to spare her knees.  Overall I am making no changes in her care. She will return to see me in one year. She knows to call for any problems that  may develop before that visit.  Amayrany Cafaro C    01/12/2015

## 2015-01-12 NOTE — Telephone Encounter (Signed)
, °

## 2015-01-20 ENCOUNTER — Ambulatory Visit
Admission: RE | Admit: 2015-01-20 | Discharge: 2015-01-20 | Disposition: A | Discharge status: Home / Self Care | Payer: 59 | Source: Ambulatory Visit

## 2015-01-20 DIAGNOSIS — Z1231 Encounter for screening mammogram for malignant neoplasm of breast: Secondary | ICD-10-CM

## 2015-01-31 ENCOUNTER — Other Ambulatory Visit: Payer: Self-pay | Admitting: Oncology

## 2015-11-08 ENCOUNTER — Other Ambulatory Visit: Payer: Self-pay | Admitting: *Deleted

## 2015-11-08 MED ORDER — TAMOXIFEN CITRATE 20 MG PO TABS: 20.0000 mg | ORAL_TABLET | Freq: Every day | ORAL | Status: DC

## 2016-01-10 ENCOUNTER — Other Ambulatory Visit: Payer: Self-pay | Admitting: *Deleted

## 2016-01-10 DIAGNOSIS — C50911 Malignant neoplasm of unspecified site of right female breast: Secondary | ICD-10-CM

## 2016-01-11 ENCOUNTER — Other Ambulatory Visit (HOSPITAL_BASED_OUTPATIENT_CLINIC_OR_DEPARTMENT_OTHER): Payer: BLUE CROSS/BLUE SHIELD

## 2016-01-11 DIAGNOSIS — C50211 Malignant neoplasm of upper-inner quadrant of right female breast: Secondary | ICD-10-CM

## 2016-01-11 DIAGNOSIS — C50911 Malignant neoplasm of unspecified site of right female breast: Secondary | ICD-10-CM

## 2016-01-11 LAB — COMPREHENSIVE METABOLIC PANEL
ALT: 20 U/L (ref 0–55)
AST: 23 U/L (ref 5–34)
Albumin: 3.6 g/dL (ref 3.5–5.0)
Alkaline Phosphatase: 51 U/L (ref 40–150)
Anion Gap: 9 mEq/L (ref 3–11)
BUN: 13.8 mg/dL (ref 7.0–26.0)
CO2: 26 meq/L (ref 22–29)
CREATININE: 0.8 mg/dL (ref 0.6–1.1)
Calcium: 9.3 mg/dL (ref 8.4–10.4)
Chloride: 105 mEq/L (ref 98–109)
EGFR: 84 mL/min/{1.73_m2} — AB (ref >90)
GLUCOSE: 91 mg/dL (ref 70–140)
Potassium: 4.2 mEq/L (ref 3.5–5.1)
SODIUM: 140 meq/L (ref 136–145)
TOTAL PROTEIN: 6.8 g/dL (ref 6.4–8.3)
Total Bilirubin: 0.32 mg/dL (ref 0.20–1.20)

## 2016-01-11 LAB — CBC WITH DIFFERENTIAL/PLATELET
BASO%: 1.2 % (ref 0.0–2.0)
Basophils Absolute: 0.1 10*3/uL (ref 0.0–0.1)
EOS%: 2 % (ref 0.0–7.0)
Eosinophils Absolute: 0.1 10*3/uL (ref 0.0–0.5)
HCT: 36.6 % (ref 34.8–46.6)
HGB: 12.2 g/dL (ref 11.6–15.9)
LYMPH%: 24.3 % (ref 14.0–49.7)
MCH: 28.1 pg (ref 25.1–34.0)
MCHC: 33.2 g/dL (ref 31.5–36.0)
MCV: 84.8 fL (ref 79.5–101.0)
MONO#: 0.4 10*3/uL (ref 0.1–0.9)
MONO%: 9.2 % (ref 0.0–14.0)
NEUT%: 63.3 % (ref 38.4–76.8)
NEUTROS ABS: 2.9 10*3/uL (ref 1.5–6.5)
Platelets: 249 10*3/uL (ref 145–400)
RBC: 4.32 10*6/uL (ref 3.70–5.45)
RDW: 13.8 % (ref 11.2–14.5)
WBC: 4.6 10*3/uL (ref 3.9–10.3)
lymph#: 1.1 10*3/uL (ref 0.9–3.3)

## 2016-01-18 ENCOUNTER — Other Ambulatory Visit: Payer: Self-pay | Admitting: Oncology

## 2016-01-18 ENCOUNTER — Ambulatory Visit (HOSPITAL_BASED_OUTPATIENT_CLINIC_OR_DEPARTMENT_OTHER): Payer: BLUE CROSS/BLUE SHIELD | Admitting: Oncology

## 2016-01-18 ENCOUNTER — Telehealth: Payer: Self-pay | Admitting: Oncology

## 2016-01-18 VITALS — BP 121/66 | HR 97 | Temp 98.0°F | Resp 18 | Ht 65.0 in | Wt 180.3 lb

## 2016-01-18 DIAGNOSIS — Z853 Personal history of malignant neoplasm of breast: Secondary | ICD-10-CM

## 2016-01-18 DIAGNOSIS — C50211 Malignant neoplasm of upper-inner quadrant of right female breast: Secondary | ICD-10-CM

## 2016-01-18 DIAGNOSIS — Z1231 Encounter for screening mammogram for malignant neoplasm of breast: Secondary | ICD-10-CM

## 2016-01-18 DIAGNOSIS — Z79811 Long term (current) use of aromatase inhibitors: Secondary | ICD-10-CM | POA: Diagnosis not present

## 2016-01-18 DIAGNOSIS — C50911 Malignant neoplasm of unspecified site of right female breast: Secondary | ICD-10-CM

## 2016-01-18 MED ORDER — TAMOXIFEN CITRATE 20 MG PO TABS: 20.0000 mg | ORAL_TABLET | Freq: Every day | ORAL | Status: DC

## 2016-01-18 NOTE — Telephone Encounter (Signed)
appointments made and avs printed

## 2016-01-18 NOTE — Progress Notes (Signed)
ID: Savannah [name]   DOB: 07-24-1956  MR#: [id number]  QMV#:[id number]  PCP: Horton Finer, MD GYN: Dory Horn SU: Erroll Luna OTHER MD: Ledon Snare, Almedia Balls, Dorena Cookey   HISTORY OF PRESENT ILLNESS: (cf Dr Johny Shears note 07/01/07)   The patient had an abnormal screening mammogram on January 22, 2007.  She returned on March 6 for repeat compression views of the right breast as well as ultrasound.  The compression views confirmed the presence of an ill-defined increased density mass with spiculated margins.  Ultrasound failed to demonstrate any abnormality in this region.  The patient returned on March 17 for breast MRI.  This showed a bilobed irregularly enhancing mass measuring 1.4 cm.  Since the MRI showed the tumor best, the patient underwent MRI-guided breast biopsy on March 31.  This showed invasive mammary carcinoma.  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  Three lymph nodes were sampled and one contained a macroscopic deposit of metastatic disease with a small amount of extracapsular extension.  The patient met with Dr. Eston Esters.  She underwent oncotype testing with an overall score of 18 indicating 11% recurrence rate for node negative patients.  Based on this, she was leaning away from systemic chemotherapy, pending completion axillary dissection.  She also underwent complete staging with CT scans of the chest, abdomen, and pelvis on May 30 as well as bone scan on May 30.  The bone scans showed arthritic changes at the L5-S1 level.  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  The patient proceeded to actual lymph node dissection on June 24.  Dr. Brantley Stage was able to remove 13 additional lymph nodes and these were all [name] of metastatic involvement.  In followup with Dr. Truddie Coco, Ms.  Fournier elected to proceed with oophorectomy.  This was performed by Dr. Dory Horn on July 15.  Her subsequent history is as detailed below.  INTERVAL HISTORY: Savannah [name] returns today for followup of her estrogen receptor positive breast cancer. She continues on tamoxifen. Generally she tolerates this well. She no longer has significant night sweats. She has occasional hot flashes. Vaginal dryness is not an issue. She obtains a drug at a good price.  REVIEW OF SYSTEMS: She is concerned about her weight. She doesn't have a plan to exercise although she is a member of [employer]. She is thinking of a weight watcher diet. There is significant stress in her life chiefly because of work demands. She also has heartburn/reflux problems which are not well-controlled on famotidine. Aside from these issues a detailed review of systems today was stable  PAST MEDICAL HISTORY: No past medical history on file. History of fibromyalgia, remote history of malaria, history of shingles, history of endometriosis, remote history of vascular headaches, history of breast cancer  PAST SURGICAL HISTORY: No past surgical history on file. Status post right lumpectomy and full axillary lymph node dissection, status post hysterectomy with bilateral stopping the oophorectomy, status post cholecystectomy, status post tonsillectomy and adenoidectomy.  FAMILY HISTORY No family history on file. The patient's father died at age 66 from a heart attack. The patient's mother died at age 73 from colon cancer, which had been diagnosed less than 6 months before. Savannah [name] and has a brother who is Magazine features editor of the [employer], broadcasting/film/video at [employer].  He lives in [place]. She had no sisters. There is no history of breast or ovarian cancer in the family.  GYNECOLOGIC HISTORY: Menarche age 72, she is GX P0. She had her hysterectomy in March of 2008. She never took hormone replacement. She did take birth control pills between 1990 and 2008,  with no clotting or other complications.  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 She is single and lives by herself, with no pets.   ADVANCED DIRECTIVES: In place. She has named her friend Caleen Jobs as her healthcare power of attorney. : Evelyn's cell number is [phone].  HEALTH MAINTENANCE: Social History  Substance Use Topics  . Smoking status: Never Smoker   . Smokeless tobacco: Not on file  . Alcohol Use: Not on file     Colonoscopy: She has a tortuous colon and generally requires a virtual colonoscopy  PAP: s/p hysterectomy and bilateral salpingo-oophorectomy  Bone density:   Lipid panel:  Allergies  Allergen Reactions  . Darvon Itching and Nausea Only    Current Outpatient Prescriptions  Medication Sig Dispense Refill  . diphenhydrAMINE (BENADRYL) 25 MG tablet Take 25 mg by mouth at bedtime as needed.    . cholecalciferol (VITAMIN D) 1000 UNITS tablet Take 1,000 Units by mouth daily.    . cyclobenzaprine (FLEXERIL) 10 MG tablet Take 10 mg by mouth daily as needed.    . famotidine (PEPCID) 20 MG tablet Take 20 mg by mouth 2 (two) times daily.    . Multiple Vitamin (MULTIVITAMIN) tablet Take 1 tablet by mouth daily.    . Omega-3 Fatty Acids (FISH OIL PO) Take by mouth.    . tamoxifen (NOLVADEX) 20 MG tablet Take 1 tablet (20 mg total) by mouth daily. 90 tablet 2  . vitamin E 200 UNIT capsule Take 200 Units by mouth 2 (two) times daily. Pt. Not sure of dose     No current facility-administered medications for this visit.    OBJECTIVE: Middle-aged white woman who appears well Filed Vitals:   01/18/16 1133  BP: 121/66  Pulse: 97  Temp: 98 F (36.7 C)  Resp: 18     Body mass index is 30 kg/(m^2).    ECOG FS: 0  Sclerae unicteric, pupils round and equal Oropharynx clear and moist-- no thrush or other lesions No cervical or supraclavicular adenopathy Lungs no rales or rhonchi Heart regular rate and  rhythm Abd soft, nontender, positive bowel sounds MSK no focal spinal tenderness, no upper extremity lymphedema Neuro: nonfocal, well oriented, appropriate affect Breasts: The right breast is status post lumpectomy and radiation. There is some distortion of the breast profile but no evidence of disease recurrence or progression. The right axilla is benign. The left breast is unremarkable.    LAB RESULTS: Lab Results  Component Value Date   WBC 4.6 01/11/2016   NEUTROABS 2.9 01/11/2016   HGB 12.2 01/11/2016   HCT 36.6 01/11/2016   MCV 84.8 01/11/2016   PLT 249 01/11/2016      Chemistry      Component Value Date/Time   NA 140 01/11/2016 1105   NA 140 10/21/2011 1342   K 4.2 01/11/2016 1105   K 4.0 10/21/2011 1342   CL 106 12/24/2012 1401   CL 104 10/21/2011 1342   CO2 26 01/11/2016 1105   CO2 26 10/21/2011 1342   BUN 13.8 01/11/2016 1105   BUN 13 10/21/2011 1342   CREATININE 0.8 01/11/2016 1105  CREATININE 0.67 10/21/2011 1342      Component Value Date/Time   CALCIUM 9.3 01/11/2016 1105   CALCIUM 9.7 10/21/2011 1342   ALKPHOS 51 01/11/2016 1105   ALKPHOS 43 10/21/2011 1342   AST 23 01/11/2016 1105   AST 21 10/21/2011 1342   ALT 20 01/11/2016 1105   ALT 14 10/21/2011 1342   BILITOT 0.32 01/11/2016 1105   BILITOT 0.2* 10/21/2011 1342       Lab Results  Component Value Date   LABCA2 10 12/24/2012    No components found for: [id number]  No results for input(s): INR in the last 168 hours.  Urinalysis    Component Value Date/Time   COLORURINE YELLOW 06/21/2007 1029   APPEARANCEUR CLEAR 06/21/2007 1029   LABSPEC 1.010 06/21/2007 1029   PHURINE 6.0 06/21/2007 1029   GLUCOSEU NEGATIVE 06/21/2007 1029   HGBUR MODERATE* 06/21/2007 [facility] 06/21/2007 1029   KETONESUR NEGATIVE 06/21/2007 1029   PROTEINUR NEGATIVE 06/21/2007 1029   UROBILINOGEN 0.2 06/21/2007 1029   NITRITE NEGATIVE 06/21/2007 [place] 06/21/2007 1029     STUDIES: Mammography is due at this time.  ASSESSMENT: 60 y.o. [place] woman  (1) status post right lumpectomy and sentinel lymph node sampling 04/10/2007 for a pT1b pN1, stage IIA invasive ductal carcinoma, grade 1, estrogen receptor 95% and progesterone receptor 94% positive, with an MIB-1 of 13%, and no evidence of HER-2 amplification  (2) full axillary dissection 05/26/2007 showed an additional 13 lymph nodes to be negative.  (3) oncotype score of 18 indicating 11 % recurrence rate if her only adjuvant systemic treatment was tamoxifen for 5 years  (3) adjuvant radiation completed 09/01/2007  (4) status post total abdominal hysterectomy with bilateral salpingo-oophorectomy 06/16/2007, with benign pathology.  (5) on anastrozole and exemestane for about 6 months total, with poor tolerance; on tamoxifen as of February 2009  PLAN:  Savannah [name] is now almost 9 years out from definitive surgery for her breast cancer with no evidence of disease recurrence. This is very favorable.  The plan is to continue tamoxifen through February 2019 at which point she will "graduate".  She would like to lose a few pounds although her weight is not bad. We discussed diet and specifically avoiding carbohydrates which includes alcohol.  As far as exercise is concerned she does have a membership at a gym, but is not really utilizing it. I think she would do better with a program like [employer], and I gave her the information today.  Otherwise she will have her mammogram next week (I just entered the order) and she will see me again a year from now.  She knows to call for any problems that may develop before her next visit.  Hollyn Stucky C    01/18/2016

## 2016-02-01 ENCOUNTER — Ambulatory Visit
Admission: RE | Admit: 2016-02-01 | Discharge: 2016-02-01 | Disposition: A | Discharge status: Home / Self Care | Payer: BLUE CROSS/BLUE SHIELD | Source: Ambulatory Visit | Attending: Oncology | Admitting: Oncology

## 2016-02-01 DIAGNOSIS — Z1231 Encounter for screening mammogram for malignant neoplasm of breast: Secondary | ICD-10-CM

## 2016-07-31 DIAGNOSIS — M50323 Other cervical disc degeneration at C6-C7 level: Secondary | ICD-10-CM | POA: Diagnosis not present

## 2016-07-31 DIAGNOSIS — M9902 Segmental and somatic dysfunction of thoracic region: Secondary | ICD-10-CM | POA: Diagnosis not present

## 2016-07-31 DIAGNOSIS — M5136 Other intervertebral disc degeneration, lumbar region: Secondary | ICD-10-CM | POA: Diagnosis not present

## 2016-08-01 DIAGNOSIS — M5136 Other intervertebral disc degeneration, lumbar region: Secondary | ICD-10-CM | POA: Diagnosis not present

## 2016-08-01 DIAGNOSIS — M9902 Segmental and somatic dysfunction of thoracic region: Secondary | ICD-10-CM | POA: Diagnosis not present

## 2016-08-01 DIAGNOSIS — M50323 Other cervical disc degeneration at C6-C7 level: Secondary | ICD-10-CM | POA: Diagnosis not present

## 2016-08-06 DIAGNOSIS — M5136 Other intervertebral disc degeneration, lumbar region: Secondary | ICD-10-CM | POA: Diagnosis not present

## 2016-08-06 DIAGNOSIS — M9901 Segmental and somatic dysfunction of cervical region: Secondary | ICD-10-CM | POA: Diagnosis not present

## 2016-08-06 DIAGNOSIS — M50323 Other cervical disc degeneration at C6-C7 level: Secondary | ICD-10-CM | POA: Diagnosis not present

## 2016-08-08 DIAGNOSIS — M9901 Segmental and somatic dysfunction of cervical region: Secondary | ICD-10-CM | POA: Diagnosis not present

## 2016-08-08 DIAGNOSIS — M50323 Other cervical disc degeneration at C6-C7 level: Secondary | ICD-10-CM | POA: Diagnosis not present

## 2016-08-08 DIAGNOSIS — M5136 Other intervertebral disc degeneration, lumbar region: Secondary | ICD-10-CM | POA: Diagnosis not present

## 2016-08-08 DIAGNOSIS — M9902 Segmental and somatic dysfunction of thoracic region: Secondary | ICD-10-CM | POA: Diagnosis not present

## 2016-08-12 DIAGNOSIS — M9901 Segmental and somatic dysfunction of cervical region: Secondary | ICD-10-CM | POA: Diagnosis not present

## 2016-08-12 DIAGNOSIS — M50323 Other cervical disc degeneration at C6-C7 level: Secondary | ICD-10-CM | POA: Diagnosis not present

## 2016-08-12 DIAGNOSIS — M5136 Other intervertebral disc degeneration, lumbar region: Secondary | ICD-10-CM | POA: Diagnosis not present

## 2016-08-15 DIAGNOSIS — M5136 Other intervertebral disc degeneration, lumbar region: Secondary | ICD-10-CM | POA: Diagnosis not present

## 2016-08-15 DIAGNOSIS — M9902 Segmental and somatic dysfunction of thoracic region: Secondary | ICD-10-CM | POA: Diagnosis not present

## 2016-08-15 DIAGNOSIS — M50323 Other cervical disc degeneration at C6-C7 level: Secondary | ICD-10-CM | POA: Diagnosis not present

## 2016-08-20 DIAGNOSIS — M9902 Segmental and somatic dysfunction of thoracic region: Secondary | ICD-10-CM | POA: Diagnosis not present

## 2016-08-20 DIAGNOSIS — M50323 Other cervical disc degeneration at C6-C7 level: Secondary | ICD-10-CM | POA: Diagnosis not present

## 2016-08-20 DIAGNOSIS — M9901 Segmental and somatic dysfunction of cervical region: Secondary | ICD-10-CM | POA: Diagnosis not present

## 2016-08-23 DIAGNOSIS — M50323 Other cervical disc degeneration at C6-C7 level: Secondary | ICD-10-CM | POA: Diagnosis not present

## 2016-08-23 DIAGNOSIS — M9901 Segmental and somatic dysfunction of cervical region: Secondary | ICD-10-CM | POA: Diagnosis not present

## 2016-08-23 DIAGNOSIS — M9902 Segmental and somatic dysfunction of thoracic region: Secondary | ICD-10-CM | POA: Diagnosis not present

## 2016-09-05 DIAGNOSIS — M9901 Segmental and somatic dysfunction of cervical region: Secondary | ICD-10-CM | POA: Diagnosis not present

## 2016-09-05 DIAGNOSIS — M50323 Other cervical disc degeneration at C6-C7 level: Secondary | ICD-10-CM | POA: Diagnosis not present

## 2016-09-05 DIAGNOSIS — M9902 Segmental and somatic dysfunction of thoracic region: Secondary | ICD-10-CM | POA: Diagnosis not present

## 2016-09-05 DIAGNOSIS — M5136 Other intervertebral disc degeneration, lumbar region: Secondary | ICD-10-CM | POA: Diagnosis not present

## 2016-09-12 DIAGNOSIS — M5136 Other intervertebral disc degeneration, lumbar region: Secondary | ICD-10-CM | POA: Diagnosis not present

## 2016-09-12 DIAGNOSIS — M9901 Segmental and somatic dysfunction of cervical region: Secondary | ICD-10-CM | POA: Diagnosis not present

## 2016-09-12 DIAGNOSIS — M9902 Segmental and somatic dysfunction of thoracic region: Secondary | ICD-10-CM | POA: Diagnosis not present

## 2016-09-12 DIAGNOSIS — M50323 Other cervical disc degeneration at C6-C7 level: Secondary | ICD-10-CM | POA: Diagnosis not present

## 2016-09-16 DIAGNOSIS — Z0000 Encounter for general adult medical examination without abnormal findings: Secondary | ICD-10-CM | POA: Diagnosis not present

## 2016-09-16 DIAGNOSIS — R5383 Other fatigue: Secondary | ICD-10-CM | POA: Diagnosis not present

## 2016-09-16 DIAGNOSIS — C50211 Malignant neoplasm of upper-inner quadrant of right female breast: Secondary | ICD-10-CM | POA: Diagnosis not present

## 2016-09-16 DIAGNOSIS — E7800 Pure hypercholesterolemia, unspecified: Secondary | ICD-10-CM | POA: Diagnosis not present

## 2016-09-16 DIAGNOSIS — Z23 Encounter for immunization: Secondary | ICD-10-CM | POA: Diagnosis not present

## 2016-09-18 DIAGNOSIS — M9902 Segmental and somatic dysfunction of thoracic region: Secondary | ICD-10-CM | POA: Diagnosis not present

## 2016-09-18 DIAGNOSIS — M50323 Other cervical disc degeneration at C6-C7 level: Secondary | ICD-10-CM | POA: Diagnosis not present

## 2016-09-18 DIAGNOSIS — M5136 Other intervertebral disc degeneration, lumbar region: Secondary | ICD-10-CM | POA: Diagnosis not present

## 2016-09-18 DIAGNOSIS — M9901 Segmental and somatic dysfunction of cervical region: Secondary | ICD-10-CM | POA: Diagnosis not present

## 2016-09-24 DIAGNOSIS — M9902 Segmental and somatic dysfunction of thoracic region: Secondary | ICD-10-CM | POA: Diagnosis not present

## 2016-09-24 DIAGNOSIS — M9901 Segmental and somatic dysfunction of cervical region: Secondary | ICD-10-CM | POA: Diagnosis not present

## 2016-09-24 DIAGNOSIS — M50323 Other cervical disc degeneration at C6-C7 level: Secondary | ICD-10-CM | POA: Diagnosis not present

## 2016-09-24 DIAGNOSIS — M5136 Other intervertebral disc degeneration, lumbar region: Secondary | ICD-10-CM | POA: Diagnosis not present

## 2016-10-03 DIAGNOSIS — M5136 Other intervertebral disc degeneration, lumbar region: Secondary | ICD-10-CM | POA: Diagnosis not present

## 2016-10-03 DIAGNOSIS — M50323 Other cervical disc degeneration at C6-C7 level: Secondary | ICD-10-CM | POA: Diagnosis not present

## 2016-10-03 DIAGNOSIS — M9902 Segmental and somatic dysfunction of thoracic region: Secondary | ICD-10-CM | POA: Diagnosis not present

## 2016-10-03 DIAGNOSIS — M9901 Segmental and somatic dysfunction of cervical region: Secondary | ICD-10-CM | POA: Diagnosis not present

## 2016-10-10 DIAGNOSIS — M50323 Other cervical disc degeneration at C6-C7 level: Secondary | ICD-10-CM | POA: Diagnosis not present

## 2016-10-10 DIAGNOSIS — M5136 Other intervertebral disc degeneration, lumbar region: Secondary | ICD-10-CM | POA: Diagnosis not present

## 2016-10-10 DIAGNOSIS — M9902 Segmental and somatic dysfunction of thoracic region: Secondary | ICD-10-CM | POA: Diagnosis not present

## 2016-10-10 DIAGNOSIS — M9901 Segmental and somatic dysfunction of cervical region: Secondary | ICD-10-CM | POA: Diagnosis not present

## 2016-10-15 DIAGNOSIS — M5136 Other intervertebral disc degeneration, lumbar region: Secondary | ICD-10-CM | POA: Diagnosis not present

## 2016-10-15 DIAGNOSIS — M9902 Segmental and somatic dysfunction of thoracic region: Secondary | ICD-10-CM | POA: Diagnosis not present

## 2016-10-15 DIAGNOSIS — M50323 Other cervical disc degeneration at C6-C7 level: Secondary | ICD-10-CM | POA: Diagnosis not present

## 2016-10-15 DIAGNOSIS — M9901 Segmental and somatic dysfunction of cervical region: Secondary | ICD-10-CM | POA: Diagnosis not present

## 2016-10-23 DIAGNOSIS — M9901 Segmental and somatic dysfunction of cervical region: Secondary | ICD-10-CM | POA: Diagnosis not present

## 2016-10-23 DIAGNOSIS — M50323 Other cervical disc degeneration at C6-C7 level: Secondary | ICD-10-CM | POA: Diagnosis not present

## 2016-10-23 DIAGNOSIS — M9902 Segmental and somatic dysfunction of thoracic region: Secondary | ICD-10-CM | POA: Diagnosis not present

## 2016-10-23 DIAGNOSIS — M5136 Other intervertebral disc degeneration, lumbar region: Secondary | ICD-10-CM | POA: Diagnosis not present

## 2016-11-07 DIAGNOSIS — M50323 Other cervical disc degeneration at C6-C7 level: Secondary | ICD-10-CM | POA: Diagnosis not present

## 2016-11-07 DIAGNOSIS — M5136 Other intervertebral disc degeneration, lumbar region: Secondary | ICD-10-CM | POA: Diagnosis not present

## 2016-11-07 DIAGNOSIS — M9902 Segmental and somatic dysfunction of thoracic region: Secondary | ICD-10-CM | POA: Diagnosis not present

## 2016-11-07 DIAGNOSIS — M9901 Segmental and somatic dysfunction of cervical region: Secondary | ICD-10-CM | POA: Diagnosis not present

## 2016-11-21 DIAGNOSIS — M9901 Segmental and somatic dysfunction of cervical region: Secondary | ICD-10-CM | POA: Diagnosis not present

## 2016-11-21 DIAGNOSIS — M50323 Other cervical disc degeneration at C6-C7 level: Secondary | ICD-10-CM | POA: Diagnosis not present

## 2016-11-21 DIAGNOSIS — M9902 Segmental and somatic dysfunction of thoracic region: Secondary | ICD-10-CM | POA: Diagnosis not present

## 2016-11-21 DIAGNOSIS — M5136 Other intervertebral disc degeneration, lumbar region: Secondary | ICD-10-CM | POA: Diagnosis not present

## 2016-12-05 DIAGNOSIS — M9901 Segmental and somatic dysfunction of cervical region: Secondary | ICD-10-CM | POA: Diagnosis not present

## 2016-12-05 DIAGNOSIS — M5136 Other intervertebral disc degeneration, lumbar region: Secondary | ICD-10-CM | POA: Diagnosis not present

## 2016-12-05 DIAGNOSIS — M50323 Other cervical disc degeneration at C6-C7 level: Secondary | ICD-10-CM | POA: Diagnosis not present

## 2016-12-05 DIAGNOSIS — M9902 Segmental and somatic dysfunction of thoracic region: Secondary | ICD-10-CM | POA: Diagnosis not present

## 2016-12-31 ENCOUNTER — Ambulatory Visit (INDEPENDENT_AMBULATORY_CARE_PROVIDER_SITE_OTHER): Payer: Self-pay | Admitting: Nurse Practitioner

## 2016-12-31 VITALS — BP 130/78 | HR 98 | Temp 98.7°F | Wt 179.0 lb

## 2016-12-31 DIAGNOSIS — R6889 Other general symptoms and signs: Secondary | ICD-10-CM

## 2016-12-31 DIAGNOSIS — H6692 Otitis media, unspecified, left ear: Secondary | ICD-10-CM

## 2016-12-31 DIAGNOSIS — J111 Influenza due to unidentified influenza virus with other respiratory manifestations: Secondary | ICD-10-CM

## 2016-12-31 LAB — POCT INFLUENZA A/B
Influenza A, POC: NEGATIVE
Influenza B, POC: NEGATIVE

## 2016-12-31 MED ORDER — FLUTICASONE PROPIONATE 50 MCG/ACT NA SUSP: 2.0000 | Freq: Every day | NASAL | 6 refills | Status: DC

## 2016-12-31 MED ORDER — AMOXICILLIN 500 MG PO TABS: 500.0000 mg | ORAL_TABLET | Freq: Two times a day (BID) | ORAL | 0 refills | Status: AC

## 2016-12-31 MED ORDER — BENZONATATE 100 MG PO CAPS: 100.0000 mg | ORAL_CAPSULE | Freq: Two times a day (BID) | ORAL | 0 refills | Status: AC | PRN

## 2016-12-31 MED ORDER — HYDROCODONE-HOMATROPINE 5-1.5 MG/5ML PO SYRP: 5.0000 mL | ORAL_SOLUTION | Freq: Three times a day (TID) | ORAL | 0 refills | Status: AC | PRN

## 2016-12-31 MED ORDER — ONDANSETRON HCL 4 MG PO TABS: 4.0000 mg | ORAL_TABLET | Freq: Three times a day (TID) | ORAL | 0 refills | Status: AC | PRN

## 2016-12-31 NOTE — Progress Notes (Signed)
Subjective:     Savannah [name] is a 61 y.o. female who presents for evaluation of symptoms of a URI. Symptoms include achiness, congestion, cough described as nonproductive, fever as high as 102, nasal congestion, nausea without vomiting, post nasal drip and sore throat. Onset of symptoms was 3 days ago, and has been gradually worsening since that time. Treatment to date: Mucinex, Delsym, and Afrin.  The patient does have a history of bronchitis, denies asthma or PNA.   Review of Systems Constitutional: positive for chills, fatigue and fevers, negative for weight loss Eyes: negative Ears, nose, mouth, throat, and face: positive for earaches, nasal congestion, sore throat and voice change, negative for ear drainage Respiratory: positive for cough Cardiovascular: negative Gastrointestinal: positive for nausea Neurological: positive for headaches Allergic/Immunologic: positive for hay fever   Objective:    BP 130/78   Pulse 98   Temp 98.7 F (37.1 C)   Wt 179 lb (81.2 kg)   SpO2 98%   BMI 29.79 kg/m  General appearance: alert, cooperative, mild distress and due to cough. Head: Normocephalic, without obvious abnormality, atraumatic Eyes: conjunctivae/corneas clear. PERRL, EOM's intact. Fundi benign. Ears: normal TM and external ear canal right ear and abnormal TM left ear - erythematous and bulging Nose: clear discharge, turbinates red, swollen, no sinus tenderness Throat: lips, mucosa, and tongue normal; teeth and gums normal Lungs: clear to auscultation bilaterally Heart: regular rate and rhythm, S1, S2 normal, no murmur, click, rub or gallop Abdomen: soft, non-tender; bowel sounds normal; no masses,  no organomegaly Neurologic: Alert and oriented X 3, normal strength and tone. Normal symmetric reflexes. Normal coordination and gait   Assessment:    influenza and otitis media   Plan:    Discussed diagnosis and treatment of URI. Suggested symptomatic OTC remedies. Nasal  saline spray for congestion. Follow up as needed. Antibiotic for left otitis media

## 2016-12-31 NOTE — Patient Instructions (Addendum)
Influenza, Adult Influenza, more commonly known as "the flu," is a viral infection that primarily affects the respiratory tract. The respiratory tract includes organs that help you breathe, such as the lungs, nose, and throat. The flu causes many common cold symptoms, as well as a high fever and body aches. The flu spreads easily from person to person (is contagious). Getting a flu shot (influenza vaccination) every year is the best way to prevent influenza. What are the causes? Influenza is caused by a virus. You can catch the virus by:  Breathing in droplets from an infected person's cough or sneeze.  Touching something that was recently contaminated with the virus and then touching your mouth, nose, or eyes. What increases the risk? The following factors may make you more likely to get the flu:  Not cleaning your hands frequently with soap and water or alcohol-based hand sanitizer.  Having close contact with many people during cold and flu season.  Touching your mouth, eyes, or nose without washing or sanitizing your hands first.  Not drinking enough fluids or not eating a healthy diet.  Not getting enough sleep or exercise.  Being under a high amount of stress.  Not getting a yearly (annual) flu shot. You may be at a higher risk of complications from the flu, such as a severe lung infection (pneumonia), if you:  Are over the age of 22.  Are pregnant.  Have a weakened disease-fighting system (immune system). You may have a weakened immune system if you:  Have HIV or AIDS.  Are undergoing chemotherapy.  Aretaking medicines that reduce the activity of (suppress) the immune system.  Have a long-term (chronic) illness, such as heart disease, kidney disease, diabetes, or lung disease.  Have a liver disorder.  Are obese.  Have anemia. What are the signs or symptoms? Symptoms of this condition typically last 4-10 days and may include:  Fever.  Chills.  Headache, body  aches, or muscle aches.  Sore throat.  Cough.  Runny or congested nose.  Chest discomfort and cough.  Poor appetite.  Weakness or tiredness (fatigue).  Dizziness.  Nausea or vomiting. How is this diagnosed? This condition may be diagnosed based on your medical history and a physical exam. Your health care provider may do a nose or throat swab test to confirm the diagnosis. How is this treated? If influenza is detected early, you can be treated with antiviral medicine that can reduce the length of your illness and the severity of your symptoms. This medicine may be given by mouth (orally) or through an IV tube that is inserted in one of your veins. The goal of treatment is to relieve symptoms by taking care of yourself at home. This may include taking over-the-counter medicines, drinking plenty of fluids, and adding humidity to the air in your home. In some cases, influenza goes away on its own. Severe influenza or complications from influenza may be treated in a hospital. Use Tylenol as needed for pain, fever or discomfort. Follow these instructions at home:  Take over-the-counter and prescription medicines only as told by your health care provider.  Use a cool mist humidifier to add humidity to the air in your home. This can make breathing easier.  Rest as needed.  Drink enough fluid to keep your urine clear or pale yellow.  Cover your mouth and nose when you cough or sneeze.  Wash your hands with soap and water often, especially after you cough or sneeze. If soap and water are  not available, use hand sanitizer.  Stay home from work or school as told by your health care provider. Unless you are visiting your health care provider, try to avoid leaving home until your fever has been gone for 24 hours without the use of medicine.  Keep all follow-up visits as told by your health care provider. This is important. How is this prevented?  Getting an annual flu shot is the best  way to avoid getting the flu. You may get the flu shot in late summer, fall, or winter. Ask your health care provider when you should get your flu shot.  Wash your hands often or use hand sanitizer often.  Avoid contact with people who are sick during cold and flu season.  Eat a healthy diet, drink plenty of fluids, get enough sleep, and exercise regularly. Contact a health care provider if:  You develop new symptoms.  You have:  Chest pain.  Diarrhea.  A fever.  Your cough gets worse.  You produce more mucus.  You feel nauseous or you vomit. Get help right away if:  You develop shortness of breath or difficulty breathing.  Your skin or nails turn a bluish color.  You have severe pain or stiffness in your neck.  You develop a sudden headache or sudden pain in your face or ear.  You cannot stop vomiting. This information is not intended to replace advice given to you by your health care provider. Make sure you discuss any questions you have with your health care provider. Document Released: 11/15/2000 Document Revised: 04/25/2016 Document Reviewed: 09/12/2015 Elsevier Interactive Patient Education  2017 [place].  Otitis Media, Adult Otitis media is redness, soreness, and inflammation of the middle ear. Otitis media may be caused by allergies or, most commonly, by infection. Often it occurs as a complication of the common cold. What are the signs or symptoms? Symptoms of otitis media may include:  Earache.  Fever.  Ringing in your ear.  Headache.  Leakage of fluid from the ear. How is this diagnosed? To diagnose otitis media, your health care provider will examine your ear with an otoscope. This is an instrument that allows your health care provider to see into your ear in order to examine your eardrum. Your health care provider also will ask you questions about your symptoms. How is this treated? Typically, otitis media resolves on its own within 3-5 days.  Your health care provider may prescribe medicine to ease your symptoms of pain. If otitis media does not resolve within 5 days or is recurrent, your health care provider may prescribe antibiotic medicines if he or she suspects that a bacterial infection is the cause. Follow these instructions at home:  If you were prescribed an antibiotic medicine, finish it all even if you start to feel better.  Take medicines only as directed by your health care provider.  Keep all follow-up visits as directed by your health care provider. Contact a health care provider if:  You have otitis media only in one ear, or bleeding from your nose, or both.  You notice a lump on your neck.  You are not getting better in 3-5 days.  You feel worse instead of better. Get help right away if:  You have pain that is not controlled with medicine.  You have swelling, redness, or pain around your ear or stiffness in your neck.  You notice that part of your face is paralyzed.  You notice that the bone behind your ear (  mastoid) is tender when you touch it. This information is not intended to replace advice given to you by your health care provider. Make sure you discuss any questions you have with your health care provider. Document Released: 08/23/2004 Document Revised: 04/25/2016 Document Reviewed: 06/15/2013 Elsevier Interactive Patient Education  2017 [employer]  Cough, Adult Coughing is a reflex that clears your throat and your airways. Coughing helps to heal and protect your lungs. It is normal to cough occasionally, but a cough that happens with other symptoms or lasts a long time may be a sign of a condition that needs treatment. A cough may last only 2-3 weeks (acute), or it may last longer than 8 weeks (chronic). What are the causes? Coughing is commonly caused by:  Breathing in substances that irritate your lungs.  A viral or bacterial respiratory infection.  Allergies.  Asthma.  Postnasal  drip.  Smoking.  Acid backing up from the stomach into the esophagus (gastroesophageal reflux).  Certain medicines.  Chronic lung problems, including COPD (or rarely, lung cancer).  Other medical conditions such as heart failure. Follow these instructions at home: Pay attention to any changes in your symptoms. Take these actions to help with your discomfort:  Take medicines only as told by your health care provider.  If you were prescribed an antibiotic medicine, take it as told by your health care provider. Do not stop taking the antibiotic even if you start to feel better.  Talk with your health care provider before you take a cough suppressant medicine.  Drink enough fluid to keep your urine clear or pale yellow.  If the air is dry, use a cold steam vaporizer or humidifier in your bedroom or your home to help loosen secretions.  Avoid anything that causes you to cough at work or at home.  If your cough is worse at night, try sleeping in a semi-upright position.  Avoid cigarette smoke. If you smoke, quit smoking. If you need help quitting, ask your health care provider.  Avoid caffeine.  Avoid alcohol.  Rest as needed.  Use honey with lemon as needed. Contact a health care provider if:  You have new symptoms.  You cough up pus.  Your cough does not get better after 2-3 weeks, or your cough gets worse.  You cannot control your cough with suppressant medicines and you are losing sleep.  You develop pain that is getting worse or pain that is not controlled with pain medicines.  You have a fever.  You have unexplained weight loss.  You have night sweats. Get help right away if:  You cough up blood.  You have difficulty breathing.  Your heartbeat is very fast. This information is not intended to replace advice given to you by your health care provider. Make sure you discuss any questions you have with your health care provider. Document Released: 05/17/2011  Document Revised: 04/25/2016 Document Reviewed: 01/25/2015 Elsevier Interactive Patient Education  2017 [employer].

## 2017-01-09 ENCOUNTER — Telehealth: Payer: Self-pay | Admitting: Nurse Practitioner

## 2017-01-09 NOTE — Telephone Encounter (Signed)
Called patient to follow up on her status.  Reached voicemail, left message for patient to return call. 

## 2017-01-20 ENCOUNTER — Other Ambulatory Visit: Payer: Self-pay | Admitting: Oncology

## 2017-01-20 DIAGNOSIS — Z1231 Encounter for screening mammogram for malignant neoplasm of breast: Secondary | ICD-10-CM

## 2017-01-22 ENCOUNTER — Other Ambulatory Visit: Payer: BLUE CROSS/BLUE SHIELD

## 2017-01-22 ENCOUNTER — Telehealth: Payer: Self-pay | Admitting: Oncology

## 2017-01-22 NOTE — Telephone Encounter (Signed)
Spoke with the patient and confirmed new appointment

## 2017-01-29 ENCOUNTER — Other Ambulatory Visit (HOSPITAL_BASED_OUTPATIENT_CLINIC_OR_DEPARTMENT_OTHER): Payer: BLUE CROSS/BLUE SHIELD

## 2017-01-29 ENCOUNTER — Other Ambulatory Visit: Payer: Self-pay | Admitting: Adult Health

## 2017-01-29 ENCOUNTER — Ambulatory Visit: Payer: BLUE CROSS/BLUE SHIELD | Admitting: Oncology

## 2017-01-29 DIAGNOSIS — Z170 Estrogen receptor positive status [ER+]: Secondary | ICD-10-CM

## 2017-01-29 DIAGNOSIS — Z853 Personal history of malignant neoplasm of breast: Secondary | ICD-10-CM

## 2017-01-29 DIAGNOSIS — C50211 Malignant neoplasm of upper-inner quadrant of right female breast: Secondary | ICD-10-CM

## 2017-01-29 LAB — COMPREHENSIVE METABOLIC PANEL
ALBUMIN: 3.7 g/dL (ref 3.5–5.0)
ALK PHOS: 68 U/L (ref 40–150)
ALT: 23 U/L (ref 0–55)
ANION GAP: 8 meq/L (ref 3–11)
AST: 22 U/L (ref 5–34)
BILIRUBIN TOTAL: 0.42 mg/dL (ref 0.20–1.20)
BUN: 14 mg/dL (ref 7.0–26.0)
CALCIUM: 9.6 mg/dL (ref 8.4–10.4)
CO2: 27 mEq/L (ref 22–29)
CREATININE: 0.9 mg/dL (ref 0.6–1.1)
Chloride: 106 mEq/L (ref 98–109)
EGFR: 73 mL/min/{1.73_m2} — ABNORMAL LOW (ref >90)
Glucose: 88 mg/dl (ref 70–140)
Potassium: 4.2 mEq/L (ref 3.5–5.1)
Sodium: 142 mEq/L (ref 136–145)
TOTAL PROTEIN: 6.6 g/dL (ref 6.4–8.3)

## 2017-01-29 LAB — CBC WITH DIFFERENTIAL/PLATELET
BASO%: 0.5 % (ref 0.0–2.0)
Basophils Absolute: 0 10*3/uL (ref 0.0–0.1)
EOS ABS: 0.1 10*3/uL (ref 0.0–0.5)
EOS%: 2.4 % (ref 0.0–7.0)
HEMATOCRIT: 36.7 % (ref 34.8–46.6)
HEMOGLOBIN: 12 g/dL (ref 11.6–15.9)
LYMPH#: 1.3 10*3/uL (ref 0.9–3.3)
LYMPH%: 22.3 % (ref 14.0–49.7)
MCH: 27.8 pg (ref 25.1–34.0)
MCHC: 32.7 g/dL (ref 31.5–36.0)
MCV: 85.2 fL (ref 79.5–101.0)
MONO#: 0.5 10*3/uL (ref 0.1–0.9)
MONO%: 7.9 % (ref 0.0–14.0)
NEUT%: 66.9 % (ref 38.4–76.8)
NEUTROS ABS: 3.9 10*3/uL (ref 1.5–6.5)
PLATELETS: 234 10*3/uL (ref 145–400)
RBC: 4.31 10*6/uL (ref 3.70–5.45)
RDW: 13.7 % (ref 11.2–14.5)
WBC: 5.8 10*3/uL (ref 3.9–10.3)

## 2017-02-03 ENCOUNTER — Ambulatory Visit
Admission: RE | Admit: 2017-02-03 | Discharge: 2017-02-03 | Disposition: A | Discharge status: Home / Self Care | Payer: BLUE CROSS/BLUE SHIELD | Source: Ambulatory Visit | Attending: Oncology | Admitting: Oncology

## 2017-02-03 DIAGNOSIS — Z1231 Encounter for screening mammogram for malignant neoplasm of breast: Secondary | ICD-10-CM | POA: Diagnosis not present

## 2017-02-03 HISTORY — DX: Personal history of irradiation: Z92.3

## 2017-02-05 ENCOUNTER — Ambulatory Visit: Payer: BLUE CROSS/BLUE SHIELD | Admitting: Oncology

## 2017-02-10 ENCOUNTER — Telehealth: Payer: Self-pay | Admitting: Oncology

## 2017-02-10 ENCOUNTER — Ambulatory Visit: Payer: BLUE CROSS/BLUE SHIELD | Admitting: Oncology

## 2017-02-10 ENCOUNTER — Encounter: Payer: Self-pay | Admitting: Oncology

## 2017-02-10 NOTE — Telephone Encounter (Signed)
Patient called to reschedule her appointment due to inclement weather

## 2017-02-13 NOTE — Telephone Encounter (Signed)
Called pt to set up follow up yearly appt with MD in April. Pt confirmed time and date

## 2017-03-05 ENCOUNTER — Ambulatory Visit (HOSPITAL_BASED_OUTPATIENT_CLINIC_OR_DEPARTMENT_OTHER): Payer: BLUE CROSS/BLUE SHIELD | Admitting: Oncology

## 2017-03-05 ENCOUNTER — Telehealth: Payer: Self-pay | Admitting: Oncology

## 2017-03-05 VITALS — BP 119/73 | HR 105 | Temp 97.9°F | Resp 18 | Ht 65.0 in | Wt 180.3 lb

## 2017-03-05 DIAGNOSIS — Z170 Estrogen receptor positive status [ER+]: Secondary | ICD-10-CM

## 2017-03-05 DIAGNOSIS — C50211 Malignant neoplasm of upper-inner quadrant of right female breast: Secondary | ICD-10-CM

## 2017-03-05 DIAGNOSIS — Z853 Personal history of malignant neoplasm of breast: Secondary | ICD-10-CM | POA: Diagnosis not present

## 2017-03-05 NOTE — Telephone Encounter (Signed)
Gave patient avs report and appointments for January 2019. [facility] will contact patient re appointments.

## 2017-03-05 NOTE — Progress Notes (Signed)
ID: Savannah [name]   DOB: [birth year]  MR#: [id number]  GOV#:[id number]  PCP: Savannah Sax, MD GYN: Savannah [name] SU: Savannah [name] OTHER MD: Savannah [name], Savannah [name], Savannah [name]   HISTORY OF PRESENT ILLNESS: (cf Dr Savannah [name] note 07/01/07)   The patient had an abnormal screening mammogram on January 22, 2007.  She returned on March 6 for repeat compression views of the right breast as well as ultrasound.  The compression views confirmed the presence of an ill-defined increased density mass with spiculated margins.  Ultrasound failed to demonstrate any abnormality in this region.  The patient returned on March 17 for breast MRI.  This showed a bilobed irregularly enhancing mass measuring 1.4 cm.  Since the MRI showed the tumor best, the patient underwent MRI-guided breast biopsy on March 31.  This showed invasive mammary carcinoma.  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  Three lymph nodes were sampled and one contained a macroscopic deposit of metastatic disease with a small amount of extracapsular extension.  The patient met with Dr. Eston [name].  She underwent oncotype testing with an overall score of 18 indicating 11% recurrence rate for node negative patients.  Based on this, she was leaning away from systemic chemotherapy, pending completion axillary dissection.  She also underwent complete staging with CT scans of the chest, abdomen, and pelvis on May 30 as well as bone scan on May 30.  The bone scans showed arthritic changes at the L5-S1 level.  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  The patient proceeded to actual lymph node dissection on June 24.  Dr. Brantley [name] was able to remove 13 additional lymph nodes and these were all [name] of metastatic involvement.  In followup with Dr. Truddie [name], Savannah [name]  elected to proceed with oophorectomy.  This was performed by Dr. Dory [name] on July 15.  Her subsequent history is as detailed below.  INTERVAL HISTORY: Savannah [name] returns today for follow-up of her estrogen receptor positive breast cancer. The interval history is generally unremarkable. She continues on tamoxifen, with good tolerance. Hot flashes and vaginal wetness are not major issues. She obtains a drug at a very good price.  REVIEW OF SYSTEMS: Savannah [name] tells me when she wears her right upper extremity sleeve and glove sometimes the fingers swell. She has discussed this with equal of the medical supply store and they told her to get finger wraps. She is very reluctant to do this because it looks strange and she is not sure that it works. Aside from that issue, a detailed review of systems today was noncontributory  PAST MEDICAL HISTORY: Past Medical History:  Diagnosis Date  . Personal history of radiation therapy 2008   History of fibromyalgia, remote history of malaria, history of shingles, history of endometriosis, remote history of vascular headaches, history of breast cancer  PAST SURGICAL HISTORY: Past Surgical History:  Procedure Laterality Date  . BREAST LUMPECTOMY Right 2008   Status post right lumpectomy and full axillary lymph node dissection, status post hysterectomy with bilateral stopping the oophorectomy, status post cholecystectomy, status post tonsillectomy and adenoidectomy.  FAMILY HISTORY No family history on file. The patient's father died at age 51 from a heart attack. The patient's mother died at age 72 from colon cancer, which had been diagnosed less than  6 months before. Savannah [name] and has a brother who is Magazine features editor of the [employer], broadcasting/film/video at [employer]. He lives in [place]. She had no sisters. There is no history of breast or ovarian cancer in the family.  GYNECOLOGIC HISTORY: Menarche age 9, she is GX P0. She had her hysterectomy in March of 2008. She never took  hormone replacement. She did take birth control pills between 1990 and 2008, with no clotting or other complications.  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 She is single and lives by herself, with no pets.   ADVANCED DIRECTIVES: In place. She has named her friend Savannah [name] as her healthcare power of attorney. : Evelyn's cell number is [phone].  HEALTH MAINTENANCE: Social History  Substance Use Topics  . Smoking status: Never Smoker  . Smokeless tobacco: Not on file  . Alcohol use Not on file     Colonoscopy: She has a tortuous colon and generally requires a virtual colonoscopy  PAP: s/p hysterectomy and bilateral salpingo-oophorectomy  Bone density:   Lipid panel:  Allergies  Allergen Reactions  . Darvon Itching and Nausea Only    Current Outpatient Prescriptions  Medication Sig Dispense Refill  . cholecalciferol (VITAMIN D) 1000 UNITS tablet Take 1,000 Units by mouth daily.    . cyclobenzaprine (FLEXERIL) 10 MG tablet Take 10 mg by mouth daily as needed.    . diphenhydrAMINE (BENADRYL) 25 MG tablet Take 25 mg by mouth at bedtime as needed.    . famotidine (PEPCID) 20 MG tablet Take 20 mg by mouth 2 (two) times daily.    . fluticasone (FLONASE) 50 MCG/ACT nasal spray Place 2 sprays into both nostrils daily. 16 g 6  . Multiple Vitamin (MULTIVITAMIN) tablet Take 1 tablet by mouth daily.    . Omega-3 Fatty Acids (FISH OIL PO) Take by mouth.    . tamoxifen (NOLVADEX) 20 MG tablet Take 1 tablet (20 mg total) by mouth daily. 90 tablet 2  . vitamin E 200 UNIT capsule Take 200 Units by mouth 2 (two) times daily. Pt. Not sure of dose     No current facility-administered medications for this visit.     OBJECTIVE: Middle-aged white woman  In no acute distress  Vitals:   03/05/17 1320  BP: 119/73  Pulse: (!) 105  Resp: 18  Temp: 97.9 F (36.6 C)     Body mass index is 30 kg/m.    ECOG FS: 1  Sclerae  unicteric, EOMs intact Oropharynx clear and moist No cervical or supraclavicular adenopathy Lungs no rales or rhonchi Heart regular rate and rhythm Abd soft, nontender, positive bowel sounds MSK no focal spinal tenderness, very minimal right upper extremity lymphedema Neuro: nonfocal, well oriented, appropriate affect Breasts: The right breast is undergone lumpectomy followed by radiation, with no evidence of local recurrence. The right axilla is benign. The left breast is unremarkable  LAB RESULTS: Lab Results  Component Value Date   WBC 5.8 01/29/2017   NEUTROABS 3.9 01/29/2017   HGB 12.0 01/29/2017   HCT 36.7 01/29/2017   MCV 85.2 01/29/2017   PLT 234 01/29/2017      Chemistry      Component Value Date/Time   NA 142 01/29/2017 1128   K 4.2 01/29/2017 1128   CL 106 12/24/2012 1401   CO2 27 01/29/2017 1128   BUN 14.0 01/29/2017 1128   CREATININE 0.9 01/29/2017 1128      Component Value Date/Time  CALCIUM 9.6 01/29/2017 1128   ALKPHOS 68 01/29/2017 1128   AST 22 01/29/2017 1128   ALT 23 01/29/2017 1128   BILITOT 0.42 01/29/2017 1128       Lab Results  Component Value Date   LABCA2 10 12/24/2012    No components found for: [id number]  No results for input(s): INR in the last 168 hours.  Urinalysis    Component Value Date/Time   COLORURINE YELLOW 06/21/2007 1029   APPEARANCEUR CLEAR 06/21/2007 1029   LABSPEC 1.010 06/21/2007 1029   PHURINE 6.0 06/21/2007 1029   GLUCOSEU NEGATIVE 06/21/2007 1029   HGBUR MODERATE (A) 06/21/2007 1029   BILIRUBINUR NEGATIVE 06/21/2007 1029   KETONESUR NEGATIVE 06/21/2007 1029   PROTEINUR NEGATIVE 06/21/2007 1029   UROBILINOGEN 0.2 06/21/2007 1029   NITRITE NEGATIVE 06/21/2007 [place] 06/21/2007 1029    STUDIES: [facility] showed the breast density to be her there was no evidence of malignancy.  ASSESSMENT: 61 y.o. [place] woman  (1) status post right lumpectomy  and sentinel lymph node sampling 04/10/2007 for a pT1b pN1, [name] IIA invasive ductal carcinoma, grade 1, estrogen receptor 95% and progesterone receptor 94% positive, with an MIB-1 of 13%, and no evidence of HER-2 amplification  (2) full axillary dissection 05/26/2007 showed an additional 13 lymph nodes to be negative.  (3) oncotype score of 18 indicating 11 % recurrence rate if her only adjuvant systemic treatment was tamoxifen for 5 years  (3) adjuvant radiation completed 09/01/2007  (4) status post total abdominal hysterectomy with bilateral salpingo-oophorectomy 06/16/2007, with benign pathology.  (5) on anastrozole and exemestane for about 6 months total, with poor tolerance; on tamoxifen as of February 2009  PLAN:  Savannah [name] is now just about 10 years out from definitive surgery for her breast cancer with no evidence of disease recurrence. This is very favorable.  She is tolerating the tamoxifen well and the plan is to continue that for a total of 10 years which is can it take [place] to January of next year.  She is having a little bit of concern regarding more hand and finger swelling than right arm swelling. I think it would be helpful for her to have a refresh her course with physical therapy and I have put the order in for that referral.  Otherwise she will see me again in one last time next year. He knows to call for any problems that may develop before that visit  Taj Arteaga C    03/05/2017

## 2017-03-14 ENCOUNTER — Encounter: Payer: Self-pay | Admitting: Physical Therapy

## 2017-03-14 ENCOUNTER — Ambulatory Visit: Payer: BLUE CROSS/BLUE SHIELD | Attending: Oncology | Admitting: Physical Therapy

## 2017-03-14 DIAGNOSIS — M6281 Muscle weakness (generalized): Secondary | ICD-10-CM | POA: Diagnosis not present

## 2017-03-14 DIAGNOSIS — I890 Lymphedema, not elsewhere classified: Secondary | ICD-10-CM | POA: Diagnosis not present

## 2017-03-14 NOTE — Therapy (Signed)
[place], [place], [address] Phone: [phone]   Fax:  [phone]  Physical Therapy Evaluation  Patient Details  Name: Savannah [name] MRN: [id number] Date of Birth: 1956-06-30 Referring Provider: Magrinat  Encounter Date: 03/14/2017      PT End of Session - 03/14/17 1211    Visit Number 1   Number of Visits 5   Date for PT Re-Evaluation 04/11/17   PT Start Time 1019   PT Stop Time 1105   PT Time Calculation (min) 46 min   Activity Tolerance Patient tolerated treatment well   Behavior During Therapy [facility] for tasks assessed/performed      Past Medical History:  Diagnosis Date  . Personal history of radiation therapy 2008    Past Surgical History:  Procedure Laterality Date  . BREAST LUMPECTOMY Right 2008    There were no vitals filed for this visit.       Subjective Assessment - 03/14/17 1023    Subjective When they diagnosed my lymphedema in my right arm they told me I should wear my sleeve and glove everyday but I am not interested in that. I wear them when I get my heart rate up because I know that is the time I will have swelling. My right hand looks different than my left because I had a major injury in my right hand right before my breast cancer diagnosis. I think that impacts how I wear my glove. Even though the glove measures to fit me its very tight across my knuckles and my swelling has extended to my fingertips. I went to [place] they told me to wrap my fingers individually with [place]. That is what I have been doing. I hate it because my fingers stick together and I can't do things. It effects my decisions to participate in things.    Pertinent History R breast cancer in 2008 treated with lumpectomy and sentinel lymph node biopsy followed by an axillary node dissection, fibromyalgia   Patient Stated Goals to find a proper compression garment for hand   Currently in Pain?  No/denies            [facility] PT Assessment - 03/14/17 0001      Assessment   Medical Diagnosis right breast cancer   Referring Provider Magrinat   Onset Date/Surgical Date 04/10/07   Hand Dominance Right   Prior Therapy lymphedema therapy 8.5 years     Precautions   Precautions Other (comment)  lymphedema     Restrictions   Weight Bearing Restrictions No     Balance Screen   Has the patient fallen in the past 6 months No   Has the patient had a decrease in activity level because of a fear of falling?  No   Is the patient reluctant to leave their home because of a fear of falling?  No     Home Environment   Living Environment Private residence   Living Arrangements Alone   Available Help at Discharge Friend(s);Neighbor   Type of [place] to enter   Entrance Stairs-Number of Steps 4   Entrance Stairs-Rails Left   Home Layout One level     Prior Function   Level of [place]  hires help with vacuuming secondary to lymphedema   Vocation Full time employment;Self employed  photographer   Vocation Requirements carrying photography gear but has assist if things are too heavy   Leisure walk a  lot, uses elliptical at gym     Cognition   Overall Cognitive Status Within Functional Limits for tasks assessed     Observation/Other Assessments   Other Surveys  --  LLIS 22% impairment     AROM   Overall AROM  Within functional limits for tasks performed     Strength   Overall Strength Deficits  4/5 bilateral UEs           LYMPHEDEMA/ONCOLOGY QUESTIONNAIRE - 03/14/17 1042      Type   Cancer Type right breast cancer     Surgeries   Lumpectomy Date 04/10/07   Sentinel Lymph Node Biopsy Date 04/10/07   Axillary Lymph Node Dissection Date 05/26/07   Number Lymph Nodes Removed 16     Date Lymphedema/Swelling Started   Date 11/02/07     Treatment   Active Chemotherapy Treatment No   Past Chemotherapy Treatment No   Active  Radiation Treatment No   Past Radiation Treatment Yes   Date 08/03/07   Body Site right breast and axilla   Current Hormone Treatment Yes   Drug Name Tamoxifen   Past Hormone Therapy No     What other symptoms do you have   Are you Having Heaviness or Tightness Yes   Are you having Pain No   Are you having pitting edema No   Is it Hard or Difficult finding clothes that fit No   Do you have infections No   Is there Decreased scar mobility No     Lymphedema Assessments   Lymphedema Assessments Upper extremities     Right Upper Extremity Lymphedema   15 cm Proximal to Olecranon Process 27.6 cm   10 cm Proximal to Olecranon Process 25.5 cm   Olecranon Process 24 cm   15 cm Proximal to Ulnar Styloid Process 22.5 cm   10 cm Proximal to Ulnar Styloid Process 19.3 cm   Just Proximal to Ulnar Styloid Process 15.6 cm   Across Hand at [employer] 19.2 cm   At [place] of 2nd Digit 6.5 cm     Left Upper Extremity Lymphedema   15 cm Proximal to Olecranon Process 28 cm   10 cm Proximal to Olecranon Process 25.2 cm   Olecranon Process 23.9 cm   15 cm Proximal to Ulnar Styloid Process 21.7 cm   10 cm Proximal to Ulnar Styloid Process 18.6 cm   Just Proximal to Ulnar Styloid Process 14.6 cm   Across Hand at [employer] 18.2 cm   At [place] of 2nd Digit 6.2 cm                                Long Term Clinic Goals - 03/14/17 1220      CC Long Term Goal  #1   Title Patient will be independent in a home exercise program for strengthening of her shoulders   Time 4   Period Weeks   Status New     CC Long Term Goal  #2   Title Pt to receive appropriate compression garment for management of right hand and finger lymphedema   Time 4   Period Weeks   Status New            Plan - 03/14/17 1211    Clinical Impression Statement Patient presents to PT with finger swelling. She underwent treatment for R breast cancer in 2008 including a lumpectomy and radiation.  She developed lymphedmea in her right upper extremity after that. She only wears her glove and sleeve at times she is prone to swelling including exercises and when it is humid out. She has been managing her lymphedema very well except at her fingers. She reports she suffered an injury to her right hand prior to her breast cancer diagnosis that causes her knuckles to be larger on the right side. When she wears an off the shelf glove the area around her knuckles is too tight and the fingers are too loose. ALso the length of the fingers is too short as it does not cover the finger tips. Pt would benefit from a custom compression glove for long term management of hand and finger edema that takes these things into account. She would benefit from skilled PT services to be measured and obtain an appropriate compression glove and for strengthening exercises for shoulder weakness. This evaluation was of low complexity due to lack of comorbidities.    Rehab Potential Excellent   Clinical Impairments Affecting Rehab Potential none   PT Frequency 1x / week   PT Duration 4 weeks   PT Treatment/Interventions ADLs/Self Care Home Management;Therapeutic exercise;Patient/family education;Orthotic Fit/Training;Manual lymph drainage;Compression bandaging   PT Next Visit Plan teach pt bandaging with Transelast, measure for custom flat knit compression glove, give supine scap series   Consulted and Agree with Plan of Care Patient      Patient will benefit from skilled therapeutic intervention in order to improve the following deficits and impairments:  Increased edema, Decreased strength  Visit Diagnosis: Lymphedema, not elsewhere classified - Plan: PT plan of care cert/re-cert  Muscle weakness (generalized) - Plan: PT plan of care cert/re-cert     Problem List Patient Active Problem List   Diagnosis Date Noted  . Breast cancer of upper-inner quadrant of right female breast ([place]) 01/03/2014  . Swelling of limb  07/13/2013    Allyson Sabal [facility] 03/14/2017, 12:23 PM  [place], [place], [address] Phone: [phone]   Fax:  [phone]  Name: Savannah [name] MRN: [id number] Date of Birth: Oct 30, 1956  Manus Gunning, PT 03/14/17 12:24 PM

## 2017-03-21 ENCOUNTER — Encounter: Payer: Self-pay | Admitting: Physical Therapy

## 2017-03-21 ENCOUNTER — Ambulatory Visit: Payer: BLUE CROSS/BLUE SHIELD | Admitting: Physical Therapy

## 2017-03-21 DIAGNOSIS — I890 Lymphedema, not elsewhere classified: Secondary | ICD-10-CM | POA: Diagnosis not present

## 2017-03-21 DIAGNOSIS — M6281 Muscle weakness (generalized): Secondary | ICD-10-CM | POA: Diagnosis not present

## 2017-03-21 NOTE — Therapy (Addendum)
[place], [place], [address] Phone: [phone]   Fax:  [phone]  Physical Therapy Treatment  Patient Details  Name: Savannah [name] MRN: [id number] Date of Birth: 08/20/1956 Referring Provider: Magrinat  Encounter Date: 03/21/2017      PT End of Session - 03/21/17 1205    Visit Number 2   Number of Visits 5   Date for PT Re-Evaluation 04/11/17   PT Start Time 0930   PT Stop Time 1020   PT Time Calculation (min) 50 min   Activity Tolerance Patient tolerated treatment well   Behavior During Therapy [facility] for tasks assessed/performed      Past Medical History:  Diagnosis Date  . Personal history of radiation therapy 2008    Past Surgical History:  Procedure Laterality Date  . BREAST LUMPECTOMY Right 2008    There were no vitals filed for this visit.      Subjective Assessment - 03/21/17 0931    Subjective My swelling is no different.    Pertinent History R breast cancer in 2008 treated with lumpectomy and sentinel lymph node biopsy followed by an axillary node dissection, fibromyalgia   Patient Stated Goals to find a proper compression garment for hand   Currently in Pain? No/denies                         [facility] PT Treatment/Exercise - 03/21/17 0001      Manual Therapy   Manual Therapy Compression Bandaging;Edema management   Edema Management educated pt about different types of compression gloves available, pt decided on compression glove and began measuring but stopped at fingers because she wanted closed fingers so sent pt to DME supplier for measurements, also assessed pt's current sleeve and gloves for fit, her current sleeve is too large   Compression Bandaging Instructed pt in way to apply transelast to fingers for compression, she had been using [place] [facility] Goals - 03/14/17 1220      CC Long Term Goal   #1   Title Patient will be independent in a home exercise program for strengthening of her shoulders   Time 4   Period Weeks   Status New     CC Long Term Goal  #2   Title Pt to receive appropriate compression garment for management of right hand and finger lymphedema   Time 4   Period Weeks   Status New            Plan - 03/21/17 1205    Clinical Impression Statement Assessed pt's current sleeve and gloves for fit. Her current sleeve is one size too big according to her current RUE circumferences. Pt states her weight does fluctuate and she is currently loosing weight. Showed pt different types of flat knit compression gloves and pt decided on which flat knit glove she wanted. Began the measuring process for the glove but pt requires closed finger custom so educated pt to go to DME supplier to be fit for new glove. Educated pt on compression bandaging technique for fingers to cover finger tips where pt is prone to swelling when using her current glove. Gave pt transelast to take home and try technique with to see if this will work for her. To see pt for a follow up visit once  she receives her compression glove from [place].    Rehab Potential Excellent   Clinical Impairments Affecting Rehab Potential none   PT Frequency 1x / week   PT Duration 4 weeks   PT Treatment/Interventions ADLs/Self Care Home Management;Therapeutic exercise;Patient/family education;Orthotic Fit/Training;Manual lymph drainage;Compression bandaging   PT Next Visit Plan assess fit of custom compression glove, issued supine scap series   Consulted and Agree with Plan of Care Patient      Patient will benefit from skilled therapeutic intervention in order to improve the following deficits and impairments:  Increased edema, Decreased strength  Visit Diagnosis: Lymphedema, not elsewhere classified     Problem List Patient Active Problem List   Diagnosis Date Noted  . Breast cancer of upper-inner  quadrant of right female breast ([place]) 01/03/2014  . Swelling of limb 07/13/2013    Allyson Sabal [facility] 03/21/2017, 12:09 PM  [place], [place], [address] Phone: [phone]   Fax:  [phone]  Name: Savannah [name] MRN: [id number] Date of Birth: [birth year]  Manus Gunning, PT 03/21/17 12:09 PM  PHYSICAL THERAPY DISCHARGE SUMMARY  Visits from Start of Care: 2  Current functional level related to goals / functional outcomes: See above   Remaining deficits: See above   Education / Equipment: See above  Plan: Patient agrees to discharge.  Patient goals were not met. Patient is being discharged due to not returning since the last visit.  ?????    Allyson Sabal [place], [place] 12/29/17 9:38 AM

## 2017-03-27 ENCOUNTER — Ambulatory Visit: Payer: BLUE CROSS/BLUE SHIELD | Admitting: Physical Therapy

## 2017-04-04 ENCOUNTER — Encounter: Payer: BLUE CROSS/BLUE SHIELD | Admitting: Physical Therapy

## 2017-04-07 ENCOUNTER — Ambulatory Visit: Payer: BLUE CROSS/BLUE SHIELD | Admitting: Physical Therapy

## 2017-05-07 ENCOUNTER — Encounter: Payer: Self-pay | Admitting: Nurse Practitioner

## 2017-05-07 ENCOUNTER — Ambulatory Visit (INDEPENDENT_AMBULATORY_CARE_PROVIDER_SITE_OTHER): Payer: Self-pay | Admitting: Nurse Practitioner

## 2017-05-07 VITALS — BP 114/76 | HR 92 | Temp 97.9°F | Resp 18 | Wt 170.0 lb

## 2017-05-07 DIAGNOSIS — B3731 Acute candidiasis of vulva and vagina: Secondary | ICD-10-CM

## 2017-05-07 DIAGNOSIS — B379 Candidiasis, unspecified: Secondary | ICD-10-CM

## 2017-05-07 DIAGNOSIS — B373 Candidiasis of vulva and vagina: Secondary | ICD-10-CM

## 2017-05-07 LAB — POCT CHLAMYDIA: Negative: NEGATIVE

## 2017-05-07 MED ORDER — FLUCONAZOLE 150 MG PO TABS: 150.0000 mg | ORAL_TABLET | ORAL | 0 refills | Status: AC

## 2017-05-07 NOTE — Patient Instructions (Addendum)
If no improvement in symptoms follow up with PCP or OBGYN in one week for further evaluation and for full panel STI testing as discussed. Also advised on probiotics, wearing 100% cotton bras and using some powder under breasts as needed.

## 2017-05-07 NOTE — Progress Notes (Addendum)
Subjective:     Savannah [name] is a 61 y.o. female who presents with complaints of intermittent vaginal itching and discharge for the past 5 months. This did start after she was on a course of antibiotics for AOM. She was seen at the Urgent Care at that time. She has also recently become sexually active with a new partner. This also started around the time of her symptoms. She has vaginal itching, soreness, denies urinary symptoms. She has sex about one time a week. She has tried Monistat OTC twice with some relief but not complete resolution of symptoms. She has also had some itching under her breasts bilaterally.      Review of Systems Genitourinary:negative Integument/breast: positive for dryness Vaginal itching discharge and soreness    Objective:    BP 114/76 (BP Location: Left Arm, Patient Position: Sitting, Cuff Size: Normal)   Pulse 92   Temp 97.9 F (36.6 C)   Resp 18   Wt 170 lb (77.1 kg)   SpO2 97%   BMI 28.29 kg/m            General appearance: alert, cooperative, mild distress and due to cough. Lungs: clear to auscultation bilaterally Heart: regular rate and rhythm, S1, S2 normal, no murmur, click, rub or gallop Neurologic: Grossly normal   Vaginal: external vaginal region WNL, slight erythema to inner labia bilaterally, small amount of thick white discharge present. Non tender to swab/specimen collection. No visible lesions. No odor on exam   Assessment:    Vaginal Yeast Infection    Plan:    POC Chlamydia negative will cover for vaginal yeast patient to follow up at PCP if no resolution after medications.   Instructed to use probiotic, topical powder under breasts and to wear cotton bras/change undergarments frequently when moist

## 2017-05-09 ENCOUNTER — Telehealth: Payer: Self-pay

## 2017-06-18 DIAGNOSIS — D1800 Hemangioma unspecified site: Secondary | ICD-10-CM | POA: Diagnosis not present

## 2017-06-18 DIAGNOSIS — L814 Other melanin hyperpigmentation: Secondary | ICD-10-CM | POA: Diagnosis not present

## 2017-06-18 DIAGNOSIS — L821 Other seborrheic keratosis: Secondary | ICD-10-CM | POA: Diagnosis not present

## 2017-06-18 DIAGNOSIS — L570 Actinic keratosis: Secondary | ICD-10-CM | POA: Diagnosis not present

## 2017-06-18 DIAGNOSIS — D225 Melanocytic nevi of trunk: Secondary | ICD-10-CM | POA: Diagnosis not present

## 2017-11-18 DIAGNOSIS — Z23 Encounter for immunization: Secondary | ICD-10-CM | POA: Diagnosis not present

## 2017-11-18 DIAGNOSIS — K219 Gastro-esophageal reflux disease without esophagitis: Secondary | ICD-10-CM | POA: Diagnosis not present

## 2017-11-18 DIAGNOSIS — Z0000 Encounter for general adult medical examination without abnormal findings: Secondary | ICD-10-CM | POA: Diagnosis not present

## 2017-11-18 DIAGNOSIS — E7800 Pure hypercholesterolemia, unspecified: Secondary | ICD-10-CM | POA: Diagnosis not present

## 2017-11-18 DIAGNOSIS — F419 Anxiety disorder, unspecified: Secondary | ICD-10-CM | POA: Diagnosis not present

## 2017-12-23 ENCOUNTER — Other Ambulatory Visit: Payer: BLUE CROSS/BLUE SHIELD

## 2017-12-23 ENCOUNTER — Other Ambulatory Visit: Payer: Self-pay

## 2017-12-23 DIAGNOSIS — Z170 Estrogen receptor positive status [ER+]: Secondary | ICD-10-CM

## 2017-12-23 DIAGNOSIS — C50211 Malignant neoplasm of upper-inner quadrant of right female breast: Secondary | ICD-10-CM

## 2017-12-24 ENCOUNTER — Inpatient Hospital Stay: Payer: BLUE CROSS/BLUE SHIELD | Attending: Oncology

## 2017-12-24 DIAGNOSIS — Z90710 Acquired absence of both cervix and uterus: Secondary | ICD-10-CM | POA: Insufficient documentation

## 2017-12-24 DIAGNOSIS — Z170 Estrogen receptor positive status [ER+]: Secondary | ICD-10-CM | POA: Diagnosis not present

## 2017-12-24 DIAGNOSIS — C50211 Malignant neoplasm of upper-inner quadrant of right female breast: Secondary | ICD-10-CM

## 2017-12-24 DIAGNOSIS — C50919 Malignant neoplasm of unspecified site of unspecified female breast: Secondary | ICD-10-CM | POA: Insufficient documentation

## 2017-12-24 DIAGNOSIS — Z923 Personal history of irradiation: Secondary | ICD-10-CM | POA: Insufficient documentation

## 2017-12-24 DIAGNOSIS — Z79811 Long term (current) use of aromatase inhibitors: Secondary | ICD-10-CM | POA: Insufficient documentation

## 2017-12-24 LAB — CBC WITH DIFFERENTIAL (CANCER CENTER ONLY)
Basophils Absolute: 0.1 10*3/uL (ref 0.0–0.1)
Basophils Relative: 1 %
EOS ABS: 0.2 10*3/uL (ref 0.0–0.5)
Eosinophils Relative: 3 %
HEMATOCRIT: 39.5 % (ref 34.8–46.6)
Hemoglobin: 12.9 g/dL (ref 11.6–15.9)
Lymphocytes Relative: 28 %
Lymphs Abs: 1.4 10*3/uL (ref 0.9–3.3)
MCH: 28.7 pg (ref 25.1–34.0)
MCHC: 32.7 g/dL (ref 31.5–36.0)
MCV: 88 fL (ref 79.5–101.0)
MONO ABS: 0.4 10*3/uL (ref 0.1–0.9)
MONOS PCT: 9 %
NEUTROS ABS: 2.8 10*3/uL (ref 1.5–6.5)
Neutrophils Relative %: 59 %
Platelet Count: 251 10*3/uL (ref 145–400)
RBC: 4.49 MIL/uL (ref 3.70–5.45)
RDW: 13.4 % (ref 11.2–16.1)
WBC Count: 4.8 10*3/uL (ref 3.9–10.3)

## 2017-12-24 LAB — CMP (CANCER CENTER ONLY)
ALT: 17 U/L (ref 0–55)
ANION GAP: 7 (ref 3–11)
AST: 20 U/L (ref 5–34)
Albumin: 3.8 g/dL (ref 3.5–5.0)
Alkaline Phosphatase: 65 U/L (ref 40–150)
BILIRUBIN TOTAL: 0.4 mg/dL (ref 0.2–1.2)
BUN: 13 mg/dL (ref 7–26)
CO2: 29 mmol/L (ref 22–29)
Calcium: 9.5 mg/dL (ref 8.4–10.4)
Chloride: 104 mmol/L (ref 98–109)
Creatinine: 0.77 mg/dL (ref 0.60–1.10)
GFR, Estimated: >60 mL/min (ref >60)
GLUCOSE: 76 mg/dL (ref 70–140)
POTASSIUM: 4.2 mmol/L (ref 3.3–4.7)
Sodium: 140 mmol/L (ref 136–145)
TOTAL PROTEIN: 6.8 g/dL (ref 6.4–8.3)

## 2017-12-29 NOTE — Progress Notes (Signed)
ID: Savannah [name]   DOB: November 27, 1956  MR#: [id number]  FBP#:[id number]  PCP: Savannah Ditch, MD GYN: Savannah [name] SU: Savannah [name] OTHER MD: Savannah [name], Savannah [name], Savannah [name]   HISTORY OF PRESENT ILLNESS: (cf Savannah [name] note 07/01/07)   The patient had an abnormal screening mammogram on January 22, 2007.  She returned on March 6 for repeat compression views of the right breast as well as ultrasound.  The compression views confirmed the presence of an ill-defined increased density mass with spiculated margins.  Ultrasound failed to demonstrate any abnormality in this region.  The patient returned on March 17 for breast MRI.  This showed a bilobed irregularly enhancing mass measuring 1.4 cm.  Since the MRI showed the tumor best, the patient underwent MRI-guided breast biopsy on March 31.  This showed invasive mammary carcinoma.  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  Three lymph nodes were sampled and one contained a macroscopic deposit of metastatic disease with a small amount of extracapsular extension.  The patient met with Savannah. Eston [name].  She underwent oncotype testing with an overall score of 18 indicating 11% recurrence rate for node negative patients.  Based on this, she was leaning away from systemic chemotherapy, pending completion axillary dissection.  She also underwent complete staging with CT scans of the chest, abdomen, and pelvis on May 30 as well as bone scan on May 30.  The bone scans showed arthritic changes at the L5-S1 level.  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  The patient proceeded to actual lymph node dissection on June 24.  Savannah [name] was able to remove 13 additional lymph nodes and these were all [name] of metastatic involvement.  In followup with Savannah. Truddie Coco, Ms.  [name] elected to proceed with oophorectomy.  This was performed by Savannah. Dory [name] on July 15.  Her subsequent history is as detailed below.  INTERVAL HISTORY: Savannah [name] returns today for follow-up of her estrogen receptor positive breast cancer. She discontinued tamoxifen in the Spring of 2018. She has noticed a decrease in the frequency and intensity of her hot flashes. She denies any issues with an increase in vaginal discharge. She also notes a decrease in arthralgias and myalgias.    REVIEW OF SYSTEMS: Savannah [name] reports that while she was taking tamoxifen, she had swollen ankles, but they eventually went away after she stopped taking it in the Spring of 2018. She notes that she was taking additional vitamin D and E supplements, but she is no longer taking it due to her improved health. She denies unusual headaches, visual changes, nausea, vomiting, or dizziness. There has been no unusual cough, phlegm production, or pleurisy. This been no change in bowel or bladder habits. She denies unexplained fatigue or unexplained weight loss, bleeding, rash, or fever.  She exercises chiefly by taking walks, mostly in trails.  She occasionally goes to the gym and does elliptical.  A detailed review of systems was otherwise stable.    PAST MEDICAL HISTORY: Past Medical History:  Diagnosis Date  . Personal history of radiation therapy 2008   History of fibromyalgia, remote history of malaria, history of shingles, history of endometriosis, remote history of vascular headaches, history of breast cancer  PAST SURGICAL HISTORY: Past Surgical History:  Procedure Laterality Date  .  BREAST LUMPECTOMY Right 2008   Status post right lumpectomy and full axillary lymph node dissection, status post hysterectomy with bilateral stopping the oophorectomy, status post cholecystectomy, status post tonsillectomy and adenoidectomy.  FAMILY HISTORY No family history on file. The patient's father died at age 75 from a heart  attack. The patient's mother died at age 35 from colon cancer, which had been diagnosed less than 6 months before. Savannah [name] and has a brother who is Magazine features editor of the [employer], broadcasting/film/video at [employer]. He lives in [place]. She had no sisters. There is no history of breast or ovarian cancer in the family.  GYNECOLOGIC HISTORY: Menarche age 66, she is GX P0. She had her hysterectomy in March of 2008. She never took hormone replacement. She did take birth control pills between 1990 and 2008, with no clotting or other complications.  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 She is single and lives by herself, with no pets.   ADVANCED DIRECTIVES: In place. She has named her friend Savannah [name] as her healthcare power of attorney. : Savannah [name]'s cell number is [phone].  HEALTH MAINTENANCE: Social History   Tobacco Use  . Smoking status: Never Smoker  Substance Use Topics  . Alcohol use: Not on file  . Drug use: Not on file     Colonoscopy: She has a tortuous colon and generally requires a virtual colonoscopy  PAP: s/p hysterectomy and bilateral salpingo-oophorectomy  Bone density:   Lipid panel:  Allergies  Allergen Reactions  . Darvon Itching and Nausea Only    Current Outpatient Medications  Medication Sig Dispense Refill  . cholecalciferol (VITAMIN D) 1000 UNITS tablet Take 1,000 Units by mouth daily.    . cyclobenzaprine (FLEXERIL) 10 MG tablet Take 10 mg by mouth daily as needed.    . diphenhydrAMINE (BENADRYL) 25 MG tablet Take 25 mg by mouth at bedtime as needed.    . famotidine (PEPCID) 20 MG tablet Take 20 mg by mouth 2 (two) times daily.    . fluticasone (FLONASE) 50 MCG/ACT nasal spray Place 2 sprays into both nostrils daily. 16 g 6   No current facility-administered medications for this visit.     OBJECTIVE: Middle-aged white woman who appears well  Vitals:   12/31/17 1309  BP: (!) 117/57  Pulse: 90   Resp: 18  Temp: 98.3 F (36.8 C)  SpO2: 100%     Body mass index is 27.74 kg/m.    ECOG FS: 0  Sclerae unicteric, pupils round and equal Oropharynx clear and moist No cervical or supraclavicular adenopathy Lungs no rales or rhonchi Heart regular rate and rhythm Abd soft, nontender, positive bowel sounds MSK no focal spinal tenderness, no upper extremity lymphedema Neuro: nonfocal, well oriented, appropriate affect Breasts: The right breast is status post lumpectomy followed by radiation.  There is the expected induration surrounding the incision scar, but this is unchanged from baseline.  There are no skin or nipple changes of concern.  The left breast is unremarkable.  Both axilla are benign  LAB RESULTS: Lab Results  Component Value Date   WBC 4.8 12/24/2017   NEUTROABS 2.8 12/24/2017   HGB 12.0 01/29/2017   HCT 39.5 12/24/2017   MCV 88.0 12/24/2017   PLT 251 12/24/2017      Chemistry      Component Value Date/Time   NA 140 12/24/2017 1134   NA 142 01/29/2017 1128   K 4.2 12/24/2017 1134  K 4.2 01/29/2017 1128   CL 104 12/24/2017 1134   CL 106 12/24/2012 1401   CO2 29 12/24/2017 1134   CO2 27 01/29/2017 1128   BUN 13 12/24/2017 1134   BUN 14.0 01/29/2017 1128   CREATININE 0.9 01/29/2017 1128      Component Value Date/Time   CALCIUM 9.5 12/24/2017 1134   CALCIUM 9.6 01/29/2017 1128   ALKPHOS 65 12/24/2017 1134   ALKPHOS 68 01/29/2017 1128   AST 20 12/24/2017 1134   AST 22 01/29/2017 1128   ALT 17 12/24/2017 1134   ALT 23 01/29/2017 1128   BILITOT 0.4 12/24/2017 1134   BILITOT 0.42 01/29/2017 1128       Lab Results  Component Value Date   LABCA2 10 12/24/2012    No components found for: [id number]  No results for input(s): INR in the last 168 hours.  Urinalysis    Component Value Date/Time   COLORURINE YELLOW 06/21/2007 1029   APPEARANCEUR CLEAR 06/21/2007 1029   LABSPEC 1.010 06/21/2007 1029   PHURINE 6.0 06/21/2007 1029   GLUCOSEU NEGATIVE  06/21/2007 1029   HGBUR MODERATE (A) 06/21/2007 1029   BILIRUBINUR NEGATIVE 06/21/2007 1029   KETONESUR NEGATIVE 06/21/2007 1029   PROTEINUR NEGATIVE 06/21/2007 1029   UROBILINOGEN 0.2 06/21/2007 1029   NITRITE NEGATIVE 06/21/2007 [place] 06/21/2007 1029    STUDIES: Next mammography will be due early March at the breast center  ASSESSMENT: 62 y.o. [place] woman  (1) status post right lumpectomy and sentinel lymph node sampling 04/10/2007 for a pT1b pN1, [name] IIA invasive ductal carcinoma, grade 1, estrogen receptor 95% and progesterone receptor 94% positive, with an MIB-1 of 13%, and no evidence of HER-2 amplification  (2) full axillary dissection 05/26/2007 showed an additional 13 lymph nodes to be negative.  (3) oncotype score of 18 indicating 11 % recurrence rate if her only adjuvant systemic treatment was tamoxifen for 5 years  (3) adjuvant radiation completed 09/01/2007  (4) status post total abdominal hysterectomy with bilateral salpingo-oophorectomy 06/16/2007, with benign pathology.  (5) on anastrozole, then exemestane for about 6 months total, with poor tolerance; on tamoxifen as of February 2009  (a) completed antiestrogen therapy March 2018  PLAN:  Savannah [name] is now 10-1/2 years out from definitive surgery for breast cancer with no evidence of disease recurrence.  This is very favorable.  She completed 9 years of antiestrogens with good tolerance.  Any further antiestrogen therapy at this point is not expected to be of a second benefit.  At this point I feel comfortable releasing her to her primary care physician.  All she will need in terms of breast cancer follow-up is yearly mammography and a yearly physician breast exam.  I will be glad to see Savannah [name] anytime in the future if and when it is needed but as of now are making no further routine appointment for her here.  Luella Gardenhire, Virgie Dad, MD  12/31/17 1:19 PM Medical Oncology and Hematology [facility] [address] [place], [place] [address] Tel. [phone]    Fax. [phone]  This document serves as a record of services personally performed by Lurline Del, MD. It was created on his behalf by Sheron Nightingale, a trained medical scribe. The creation of this record is based on the scribe's personal observations and the provider's statements to them.   I have reviewed the above documentation for accuracy and completeness, and I agree with the above.

## 2017-12-31 ENCOUNTER — Encounter: Payer: Self-pay | Admitting: Oncology

## 2017-12-31 ENCOUNTER — Inpatient Hospital Stay (HOSPITAL_BASED_OUTPATIENT_CLINIC_OR_DEPARTMENT_OTHER): Payer: BLUE CROSS/BLUE SHIELD | Admitting: Oncology

## 2017-12-31 VITALS — BP 117/57 | HR 90 | Temp 98.3°F | Resp 18 | Ht 65.0 in | Wt 166.7 lb

## 2017-12-31 DIAGNOSIS — C50211 Malignant neoplasm of upper-inner quadrant of right female breast: Secondary | ICD-10-CM

## 2017-12-31 DIAGNOSIS — Z90710 Acquired absence of both cervix and uterus: Secondary | ICD-10-CM

## 2017-12-31 DIAGNOSIS — C50919 Malignant neoplasm of unspecified site of unspecified female breast: Secondary | ICD-10-CM

## 2017-12-31 DIAGNOSIS — Z79811 Long term (current) use of aromatase inhibitors: Secondary | ICD-10-CM | POA: Diagnosis not present

## 2017-12-31 DIAGNOSIS — Z170 Estrogen receptor positive status [ER+]: Secondary | ICD-10-CM

## 2017-12-31 DIAGNOSIS — Z923 Personal history of irradiation: Secondary | ICD-10-CM

## 2018-01-29 DIAGNOSIS — L299 Pruritus, unspecified: Secondary | ICD-10-CM | POA: Diagnosis not present

## 2018-01-29 DIAGNOSIS — L503 Dermatographic urticaria: Secondary | ICD-10-CM | POA: Diagnosis not present

## 2018-04-01 DIAGNOSIS — R52 Pain, unspecified: Secondary | ICD-10-CM | POA: Diagnosis not present

## 2018-04-01 DIAGNOSIS — M19041 Primary osteoarthritis, right hand: Secondary | ICD-10-CM | POA: Diagnosis not present

## 2018-04-01 DIAGNOSIS — Z780 Asymptomatic menopausal state: Secondary | ICD-10-CM | POA: Diagnosis not present

## 2018-04-01 LAB — HM DEXA SCAN: HM Dexa Scan: NORMAL

## 2018-04-08 ENCOUNTER — Other Ambulatory Visit: Payer: Self-pay | Admitting: Orthopedic Surgery

## 2018-04-08 DIAGNOSIS — M19041 Primary osteoarthritis, right hand: Secondary | ICD-10-CM

## 2018-04-14 ENCOUNTER — Ambulatory Visit
Admission: RE | Admit: 2018-04-14 | Discharge: 2018-04-14 | Disposition: A | Discharge status: Home / Self Care | Payer: BLUE CROSS/BLUE SHIELD | Source: Ambulatory Visit | Attending: Orthopedic Surgery | Admitting: Orthopedic Surgery

## 2018-04-14 DIAGNOSIS — M19041 Primary osteoarthritis, right hand: Secondary | ICD-10-CM

## 2018-04-17 ENCOUNTER — Other Ambulatory Visit: Payer: Self-pay | Admitting: Orthopedic Surgery

## 2018-04-17 DIAGNOSIS — M19041 Primary osteoarthritis, right hand: Secondary | ICD-10-CM | POA: Diagnosis not present

## 2018-04-17 DIAGNOSIS — M6740 Ganglion, unspecified site: Secondary | ICD-10-CM | POA: Diagnosis not present

## 2018-04-23 ENCOUNTER — Other Ambulatory Visit: Payer: Self-pay | Admitting: Oncology

## 2018-04-23 ENCOUNTER — Encounter (HOSPITAL_BASED_OUTPATIENT_CLINIC_OR_DEPARTMENT_OTHER): Payer: Self-pay | Admitting: *Deleted

## 2018-04-23 ENCOUNTER — Other Ambulatory Visit: Payer: Self-pay

## 2018-04-23 DIAGNOSIS — C50211 Malignant neoplasm of upper-inner quadrant of right female breast: Secondary | ICD-10-CM

## 2018-04-23 DIAGNOSIS — Z170 Estrogen receptor positive status [ER+]: Secondary | ICD-10-CM

## 2018-04-23 NOTE — Progress Notes (Signed)
Refer physical

## 2018-04-28 ENCOUNTER — Other Ambulatory Visit: Payer: Self-pay | Admitting: *Deleted

## 2018-04-30 ENCOUNTER — Ambulatory Visit (HOSPITAL_BASED_OUTPATIENT_CLINIC_OR_DEPARTMENT_OTHER): Payer: BLUE CROSS/BLUE SHIELD | Admitting: Anesthesiology

## 2018-04-30 ENCOUNTER — Ambulatory Visit (HOSPITAL_BASED_OUTPATIENT_CLINIC_OR_DEPARTMENT_OTHER)
Admission: RE | Admit: 2018-04-30 | Discharge: 2018-04-30 | Disposition: A | Discharge status: Home / Self Care | Payer: BLUE CROSS/BLUE SHIELD | Source: Ambulatory Visit | Attending: Orthopedic Surgery | Admitting: Orthopedic Surgery

## 2018-04-30 ENCOUNTER — Encounter (HOSPITAL_BASED_OUTPATIENT_CLINIC_OR_DEPARTMENT_OTHER): Payer: Self-pay | Admitting: Anesthesiology

## 2018-04-30 ENCOUNTER — Other Ambulatory Visit: Payer: Self-pay

## 2018-04-30 ENCOUNTER — Encounter (HOSPITAL_BASED_OUTPATIENT_CLINIC_OR_DEPARTMENT_OTHER)
Admission: RE | Disposition: A | Discharge status: Home / Self Care | Payer: Self-pay | Source: Ambulatory Visit | Attending: Orthopedic Surgery

## 2018-04-30 DIAGNOSIS — M797 Fibromyalgia: Secondary | ICD-10-CM | POA: Diagnosis not present

## 2018-04-30 DIAGNOSIS — I890 Lymphedema, not elsewhere classified: Secondary | ICD-10-CM | POA: Insufficient documentation

## 2018-04-30 DIAGNOSIS — M1811 Unilateral primary osteoarthritis of first carpometacarpal joint, right hand: Secondary | ICD-10-CM | POA: Insufficient documentation

## 2018-04-30 DIAGNOSIS — D492 Neoplasm of unspecified behavior of bone, soft tissue, and skin: Secondary | ICD-10-CM | POA: Diagnosis not present

## 2018-04-30 DIAGNOSIS — K219 Gastro-esophageal reflux disease without esophagitis: Secondary | ICD-10-CM | POA: Insufficient documentation

## 2018-04-30 DIAGNOSIS — M71341 Other bursal cyst, right hand: Secondary | ICD-10-CM | POA: Insufficient documentation

## 2018-04-30 DIAGNOSIS — Z853 Personal history of malignant neoplasm of breast: Secondary | ICD-10-CM | POA: Diagnosis not present

## 2018-04-30 DIAGNOSIS — M189 Osteoarthritis of first carpometacarpal joint, unspecified: Secondary | ICD-10-CM | POA: Diagnosis not present

## 2018-04-30 DIAGNOSIS — Z923 Personal history of irradiation: Secondary | ICD-10-CM | POA: Insufficient documentation

## 2018-04-30 DIAGNOSIS — Z888 Allergy status to other drugs, medicaments and biological substances status: Secondary | ICD-10-CM | POA: Diagnosis not present

## 2018-04-30 DIAGNOSIS — L729 Follicular cyst of the skin and subcutaneous tissue, unspecified: Secondary | ICD-10-CM | POA: Diagnosis not present

## 2018-04-30 HISTORY — PX: MASS EXCISION: SHX2000

## 2018-04-30 HISTORY — DX: Other specified postprocedural states: Z98.890

## 2018-04-30 HISTORY — DX: Fibromyalgia: M79.7

## 2018-04-30 HISTORY — DX: Lymphedema, not elsewhere classified: I89.0

## 2018-04-30 HISTORY — DX: Nausea with vomiting, unspecified: R11.2

## 2018-04-30 SURGERY — EXCISION MASS
Anesthesia: General | Site: Thumb | Laterality: Right

## 2018-04-30 MED ORDER — SCOPOLAMINE 1 MG/3DAYS TD PT72
MEDICATED_PATCH | TRANSDERMAL | Status: AC
  Filled 2018-04-30: qty 1

## 2018-04-30 MED ORDER — ONDANSETRON HCL 4 MG/2ML IJ SOLN
INTRAMUSCULAR | Status: DC | PRN
  Administered 2018-04-30: 4 mg via INTRAVENOUS

## 2018-04-30 MED ORDER — BUPIVACAINE HCL (PF) 0.25 % IJ SOLN
INTRAMUSCULAR | Status: DC | PRN
  Administered 2018-04-30: 7 mL

## 2018-04-30 MED ORDER — DEXAMETHASONE SODIUM PHOSPHATE 4 MG/ML IJ SOLN
INTRAMUSCULAR | Status: DC | PRN
  Administered 2018-04-30: 10 mg via INTRAVENOUS

## 2018-04-30 MED ORDER — HYDROCODONE-ACETAMINOPHEN 5-325 MG PO TABS: 1.0000 | ORAL_TABLET | Freq: Four times a day (QID) | ORAL | 0 refills | Status: DC | PRN

## 2018-04-30 MED ORDER — METOCLOPRAMIDE HCL 5 MG/ML IJ SOLN: 10.0000 mg | Freq: Once | INTRAMUSCULAR | Status: DC | PRN

## 2018-04-30 MED ORDER — FENTANYL CITRATE (PF) 100 MCG/2ML IJ SOLN
INTRAMUSCULAR | Status: DC | PRN
  Administered 2018-04-30: 100 ug via INTRAVENOUS

## 2018-04-30 MED ORDER — MIDAZOLAM HCL 2 MG/2ML IJ SOLN
INTRAMUSCULAR | Status: AC
  Filled 2018-04-30: qty 2

## 2018-04-30 MED ORDER — SCOPOLAMINE 1 MG/3DAYS TD PT72: 1.0000 | MEDICATED_PATCH | TRANSDERMAL | Status: DC

## 2018-04-30 MED ORDER — CEFAZOLIN SODIUM-DEXTROSE 2-4 GM/100ML-% IV SOLN
2.0000 g | INTRAVENOUS | Status: AC
  Administered 2018-04-30: 2 g via INTRAVENOUS

## 2018-04-30 MED ORDER — SCOPOLAMINE 1 MG/3DAYS TD PT72
1.0000 | MEDICATED_PATCH | Freq: Once | TRANSDERMAL | Status: DC | PRN
  Administered 2018-04-30: 1.5 mg via TRANSDERMAL

## 2018-04-30 MED ORDER — MEPERIDINE HCL 25 MG/ML IJ SOLN: 6.2500 mg | INTRAMUSCULAR | Status: DC | PRN

## 2018-04-30 MED ORDER — FENTANYL CITRATE (PF) 100 MCG/2ML IJ SOLN: 50.0000 ug | INTRAMUSCULAR | Status: DC | PRN

## 2018-04-30 MED ORDER — ONDANSETRON HCL 4 MG/2ML IJ SOLN
INTRAMUSCULAR | Status: AC
  Filled 2018-04-30: qty 2

## 2018-04-30 MED ORDER — FENTANYL CITRATE (PF) 100 MCG/2ML IJ SOLN
INTRAMUSCULAR | Status: AC
  Filled 2018-04-30: qty 2

## 2018-04-30 MED ORDER — MIDAZOLAM HCL 2 MG/2ML IJ SOLN: 1.0000 mg | INTRAMUSCULAR | Status: DC | PRN

## 2018-04-30 MED ORDER — PROPOFOL 500 MG/50ML IV EMUL
INTRAVENOUS | Status: AC
  Filled 2018-04-30: qty 50

## 2018-04-30 MED ORDER — LIDOCAINE HCL (CARDIAC) PF 100 MG/5ML IV SOSY
PREFILLED_SYRINGE | INTRAVENOUS | Status: AC
  Filled 2018-04-30: qty 5

## 2018-04-30 MED ORDER — PROPOFOL 10 MG/ML IV BOLUS
INTRAVENOUS | Status: DC | PRN
  Administered 2018-04-30: 200 mg via INTRAVENOUS

## 2018-04-30 MED ORDER — CHLORHEXIDINE GLUCONATE 4 % EX LIQD: 60.0000 mL | Freq: Once | CUTANEOUS | Status: DC

## 2018-04-30 MED ORDER — FENTANYL CITRATE (PF) 100 MCG/2ML IJ SOLN: 25.0000 ug | INTRAMUSCULAR | Status: DC | PRN

## 2018-04-30 MED ORDER — LIDOCAINE HCL (CARDIAC) PF 100 MG/5ML IV SOSY
PREFILLED_SYRINGE | INTRAVENOUS | Status: DC | PRN
  Administered 2018-04-30: 30 mg via INTRAVENOUS

## 2018-04-30 MED ORDER — MIDAZOLAM HCL 5 MG/5ML IJ SOLN
INTRAMUSCULAR | Status: DC | PRN
Start: 2018-04-30 — End: 2018-04-30
  Administered 2018-04-30: 2 mg via INTRAVENOUS

## 2018-04-30 MED ORDER — CEFAZOLIN SODIUM-DEXTROSE 2-4 GM/100ML-% IV SOLN
INTRAVENOUS | Status: AC
  Filled 2018-04-30: qty 100

## 2018-04-30 MED ORDER — HYDROCODONE-ACETAMINOPHEN 7.5-325 MG PO TABS: 1.0000 | ORAL_TABLET | Freq: Once | ORAL | Status: DC | PRN

## 2018-04-30 MED ORDER — LACTATED RINGERS IV SOLN
INTRAVENOUS | Status: DC
  Administered 2018-04-30 (×2): via INTRAVENOUS

## 2018-04-30 SURGICAL SUPPLY — 43 items
BANDAGE COBAN STERILE 2 (GAUZE/BANDAGES/DRESSINGS) IMPLANT
BLADE SURG 15 STRL LF DISP TIS (BLADE) ×1 IMPLANT
BLADE SURG 15 STRL SS (BLADE) ×2
BNDG CMPR 9X4 STRL LF SNTH (GAUZE/BANDAGES/DRESSINGS) ×1
BNDG COHESIVE 1X5 TAN STRL LF (GAUZE/BANDAGES/DRESSINGS) ×1 IMPLANT
BNDG COHESIVE 3X5 TAN STRL LF (GAUZE/BANDAGES/DRESSINGS) IMPLANT
BNDG ESMARK 4X9 LF (GAUZE/BANDAGES/DRESSINGS) ×1 IMPLANT
BNDG GAUZE ELAST 4 BULKY (GAUZE/BANDAGES/DRESSINGS) IMPLANT
CHLORAPREP W/TINT 26ML (MISCELLANEOUS) ×2 IMPLANT
CORD BIPOLAR FORCEPS 12FT (ELECTRODE) ×2 IMPLANT
COVER BACK TABLE 60X90IN (DRAPES) ×2 IMPLANT
COVER MAYO STAND STRL (DRAPES) ×2 IMPLANT
CUFF TOURNIQUET SINGLE 18IN (TOURNIQUET CUFF) IMPLANT
DECANTER SPIKE VIAL GLASS SM (MISCELLANEOUS) IMPLANT
DRAIN PENROSE 1/2X12 LTX STRL (WOUND CARE) IMPLANT
DRAPE EXTREMITY T 121X128X90 (DRAPE) ×2 IMPLANT
DRAPE SURG 17X23 STRL (DRAPES) ×2 IMPLANT
GAUZE SPONGE 4X4 12PLY STRL (GAUZE/BANDAGES/DRESSINGS) ×2 IMPLANT
GAUZE XEROFORM 1X8 LF (GAUZE/BANDAGES/DRESSINGS) ×2 IMPLANT
GLOVE BIOGEL PI IND STRL 8.5 (GLOVE) ×1 IMPLANT
GLOVE BIOGEL PI INDICATOR 8.5 (GLOVE) ×1
GLOVE SURG ORTHO 8.0 STRL STRW (GLOVE) ×2 IMPLANT
GOWN STRL REUS W/ TWL LRG LVL3 (GOWN DISPOSABLE) ×1 IMPLANT
GOWN STRL REUS W/TWL LRG LVL3 (GOWN DISPOSABLE) ×4
GOWN STRL REUS W/TWL XL LVL3 (GOWN DISPOSABLE) ×2 IMPLANT
NDL PRECISIONGLIDE 27X1.5 (NEEDLE) ×1 IMPLANT
NDL SAFETY ECLIPSE 18X1.5 (NEEDLE) IMPLANT
NEEDLE HYPO 18GX1.5 SHARP (NEEDLE)
NEEDLE PRECISIONGLIDE 27X1.5 (NEEDLE) ×2 IMPLANT
NS IRRIG 1000ML POUR BTL (IV SOLUTION) ×2 IMPLANT
PACK BASIN DAY SURGERY FS (CUSTOM PROCEDURE TRAY) ×2 IMPLANT
PAD CAST 3X4 CTTN HI CHSV (CAST SUPPLIES) IMPLANT
PADDING CAST COTTON 3X4 STRL (CAST SUPPLIES)
SPLINT FINGER 4.25 BULB 911906 (SOFTGOODS) ×1 IMPLANT
SPLINT PLASTER CAST XFAST 3X15 (CAST SUPPLIES) IMPLANT
SPLINT PLASTER XTRA FASTSET 3X (CAST SUPPLIES)
STOCKINETTE 4X48 STRL (DRAPES) ×2 IMPLANT
SUT ETHILON 4 0 PS 2 18 (SUTURE) ×2 IMPLANT
SUT VIC AB 4-0 P2 18 (SUTURE) IMPLANT
SYR BULB 3OZ (MISCELLANEOUS) ×2 IMPLANT
SYR CONTROL 10ML LL (SYRINGE) ×2 IMPLANT
TOWEL GREEN STERILE FF (TOWEL DISPOSABLE) ×2 IMPLANT
UNDERPAD 30X30 (UNDERPADS AND DIAPERS) ×2 IMPLANT

## 2018-04-30 NOTE — Anesthesia Postprocedure Evaluation (Signed)
Anesthesia Post Note  Patient: Savannah [name]  Procedure(s) Performed: EXCISION CYST DEBRIDEMENT INTERPHALANGEAL JOINT RIGHT THUMB (Right Thumb)     Patient location during evaluation: PACU Anesthesia Type: General Level of consciousness: awake and alert and oriented Pain management: pain level controlled Vital Signs Assessment: post-procedure vital signs reviewed and stable Respiratory status: spontaneous breathing, nonlabored ventilation and respiratory function stable Cardiovascular status: blood pressure returned to baseline and stable Postop Assessment: no apparent nausea or vomiting Anesthetic complications: no    Last Vitals:  Vitals:   04/30/18 1045 04/30/18 1105  BP: 128/73 (!) 141/86  Pulse: 76 93  Resp: 14 18  Temp:  36.5 C  SpO2: 100% 98%    Last Pain:  Vitals:   04/30/18 1105  TempSrc:   PainSc: 0-No pain                 Georgianne Gritz A.

## 2018-04-30 NOTE — Anesthesia Procedure Notes (Signed)
Procedure Name: LMA Insertion Date/Time: 04/30/2018 9:56 AM Performed by: Marrianne Mood, CRNA Pre-anesthesia Checklist: Patient identified, Emergency Drugs available, Suction available, Patient being monitored and Timeout performed Patient Re-evaluated:Patient Re-evaluated prior to induction Oxygen Delivery Method: Circle system utilized Preoxygenation: Pre-oxygenation with 100% oxygen Induction Type: IV induction Ventilation: Mask ventilation without difficulty LMA: LMA inserted LMA Size: 4.0 Number of attempts: 1 Airway Equipment and Method: Bite block Placement Confirmation: positive ETCO2 Tube secured with: Tape Dental Injury: Teeth and Oropharynx as per pre-operative assessment

## 2018-04-30 NOTE — Brief Op Note (Signed)
04/30/2018  10:31 AM  PATIENT:  Savannah [name]  62 y.o. 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  POST-OPERATIVE DIAGNOSIS:  Mucoid Tumor RIGHT THUMB DEGENERATIVE JOINT   PROCEDURE:  Procedure(s): EXCISION CYST DEBRIDEMENT INTERPHALANGEAL JOINT RIGHT THUMB (Right)  SURGEON:  Surgeon(s) and Role:    * Daryll Brod, MD - Primary  PHYSICIAN ASSISTANT:   ASSISTANTS: none   ANESTHESIA:   local and general  EBL:  10 mL   BLOOD ADMINISTERED:none  DRAINS: none   LOCAL MEDICATIONS USED:  BUPIVICAINE   SPECIMEN:  Excision  DISPOSITION OF SPECIMEN:  N/A  COUNTS:  YES  TOURNIQUET:  * No tourniquets in log *  DICTATION: .Dragon Dictation  PLAN OF CARE: Discharge to home after PACU  PATIENT DISPOSITION:  PACU - hemodynamically stable.

## 2018-04-30 NOTE — Op Note (Signed)
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  Surgeon: Daryll Brod  Anesthesia: General with local metacarpal block right thumb  [facility]  History: Patient is a 62 year old female with a history of mass on the dorsal aspect IP joint right thumb.  Ultrasound reveals this as a mucoid cyst.  X-rays reveal degenerative arthritis.  She is desirous of having this excised.  She is aware that there is no guarantee to the surgery the possibility of infection recurrence injury to arteries nerves tendons complete relief symptoms and dystrophy.  Preoperative area the patient is seen extremity marked by both patient and surgeon antibiotic given.  Procedure: The patient is brought to the operating room where a general anesthetic was carried out without difficulty.  She was prepped using ChloraPrep supine position with the right arm free.  A three-minute dry time was allowed timeout taken to confirm patient procedure.  A curvilinear incision was made over the interphalangeal joint of the thumb after a metacarpal block was given with quarter percent bupivacaine without epinephrine approximately 8 cc was used.  No tourniquet was used during the procedure.  Bleeders were electrocauterized with bipolar.  Hemostasis was obtained.  The cyst was identified and removed.  The joint was then opened on its radial aspect just volar to the extensor tendon.  Significant swelling of the synovial tissue was present.  This was all removed with a hemostatic rondure.  No large osteophytes were noted.  A dorsal synovectomy was performed.  The wound was copiously irrigated with saline.  Specimen was sent to pathology.  Further hemostasis was afforded with bipolar.  The wound was then closed after irrigation with interrupted 4-0 nylon sutures.   Pressure was maintained on the incision area for further hemostasis for approximately 8 minutes.  A sterile dressing was applied with a dorsal splint.  No compressive dressing was applied.  The patient tolerated the procedure well and was taken to the recovery room for observation in satisfactory condition.  She will be discharged home to return to [place] in 1 week on ibuprofen Tylenol with a back-up of Norco as necessary.

## 2018-04-30 NOTE — Discharge Instructions (Addendum)

## 2018-04-30 NOTE — H&P (Signed)
Savannah [name] is an 62 y.o. female.   Chief Complaint: mass right thumb HPI: Savannah [name] is 62 year old right-hand-dominant former patient has not been seen in years. She sustained an injury when her cat jumped on a keyboard the keyboard struck in the IP joint of her right thumb. This occurred approximately 1 month ago. She has a history of breast cancer with lymphedema on that side. She states that she has had swelling and pain at the IP joint area since that time. Says sharp pain in nature if she uses her thumb pinching gripping or opening a door or driving his steering wheel with a VAS score for over 10 she has tried taking ibuprofen with minimal relief. She has no prior history of injuries. She localizes the area of discomfort at the IP joint of the thumb.She was referred for ultrasound to confirm with her a cyst was present. This is been done and read by Dr. Nelia Shi. This reveals a 0.8 x 0.3 x 2.8 cm loculated cystic mass at the IP joint.     She has no history of diabetes thyroid problems arthritis gout. Family history is negative for each of these also. The injury was not open.      Past Medical History:  Diagnosis Date  . Fibromyalgia   . Lymphedema   . Personal history of radiation therapy 2008  . PONV (postoperative nausea and vomiting)     Past Surgical History:  Procedure Laterality Date  . ABDOMINAL HYSTERECTOMY    . BREAST LUMPECTOMY Right 2008  . BREAST LUMPECTOMY  2008  . LAPAROSCOPY    . TONSILLECTOMY      History reviewed. No pertinent family history. Social History:  reports that she has never smoked. She has never used smokeless tobacco. She reports that she drinks alcohol. She reports that she does not use drugs.  Allergies:  Allergies  Allergen Reactions  . Darvon Itching and Nausea Only    No medications prior to admission.    No results found for this or any previous visit (from the past 48 hour(s)).  No results found.   Pertinent items are  noted in HPI.  Height 5\' 6"  (1.676 m), weight 73.9 kg (163 lb).  General appearance: alert, cooperative and appears stated age Head: Normocephalic, without obvious abnormality Neck: no JVD Resp: clear to auscultation bilaterally Cardio: regular rate and rhythm, S1, S2 normal, no murmur, click, rub or gallop GI: soft, non-tender; bowel sounds normal; no masses,  no organomegaly Extremities: mass right thumb Pulses: 2+ and symmetric Skin: Skin color, texture, turgor normal. 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 Mucoid cyst, joint    Plan: We have reviewed the ultrasound and the report with her. We recommend excision of the cyst with debridement of the IP joint. This will be to the right side. Pre-peri-and postoperative course are discussed along with risk applications. She is aware there is no guarantee to the surgery the possibility of infection recurrence injury to arteries nerves tendons complete relief symptoms dystrophy. She is going to discuss this with her lymphedema doctor. This can be done under either regional or general anesthesia.      Sian Joles R 04/30/2018, 7:55 AM

## 2018-04-30 NOTE — Anesthesia Preprocedure Evaluation (Addendum)
Anesthesia Evaluation  Patient identified by MRN, date of birth, ID band Patient awake    Reviewed: Allergy & Precautions, NPO status , Patient's Chart, lab work & pertinent test results  History of Anesthesia Complications (+) PONV and history of anesthetic complications  Airway Mallampati: II  TM Distance: >3 FB Neck ROM: Full    Dental no notable dental hx. (+) Teeth Intact, Caps   Pulmonary neg pulmonary ROS,    Pulmonary exam normal breath sounds clear to auscultation       Cardiovascular negative cardio ROS Normal cardiovascular exam Rhythm:Regular Rate:Normal     Neuro/Psych  Neuromuscular disease negative psych ROS   GI/Hepatic Neg liver ROS, GERD  Medicated and Controlled,  Endo/Other  Hx/o right breast Ca- S/P ChemoRx  Renal/GU negative Renal ROS  negative genitourinary   Musculoskeletal  (+) Fibromyalgia -Mucoid tumor right thumb   Abdominal   Peds  Hematology negative hematology ROS (+)   Anesthesia Other Findings   Reproductive/Obstetrics                             Anesthesia Physical Anesthesia Plan  ASA: II  Anesthesia Plan: General   Post-op Pain Management:    Induction: Intravenous  PONV Risk Score and Plan: 3 and Midazolam, Ondansetron, Treatment may vary due to age or medical condition, Propofol infusion, Dexamethasone and Scopolamine patch - Pre-op  Airway Management Planned: LMA  Additional Equipment:   Intra-op Plan:   Post-operative Plan: Extubation in OR  Informed Consent: I have reviewed the patients History and Physical, chart, labs and discussed the procedure including the risks, benefits and alternatives for the proposed anesthesia with the patient or authorized representative who has indicated his/her understanding and acceptance.   Dental advisory given  Plan Discussed with: CRNA and Surgeon  Anesthesia Plan Comments:         Anesthesia Quick Evaluation

## 2018-04-30 NOTE — Transfer of Care (Signed)
Immediate Anesthesia Transfer of Care Note  Patient: Necole Minassian  Procedure(s) Performed: EXCISION CYST DEBRIDEMENT INTERPHALANGEAL JOINT RIGHT THUMB (Right Thumb)  Patient Location: PACU  Anesthesia Type:General  Level of Consciousness: awake and patient cooperative  Airway & Oxygen Therapy: Patient Spontanous Breathing and Patient connected to face mask oxygen  Post-op Assessment: Report given to RN and Post -op Vital signs reviewed and stable  Post vital signs: Reviewed and stable  Last Vitals:  Vitals Value Taken Time  BP    Temp    Pulse 92 04/30/2018 10:30 AM  Resp    SpO2 100 % 04/30/2018 10:30 AM  Vitals shown include unvalidated device data.  Last Pain:  Vitals:   04/30/18 0905  TempSrc: Oral  PainSc: 5       Patients Stated Pain Goal: 3 (32/20/25 4270)  Complications: No apparent anesthesia complications

## 2018-05-01 ENCOUNTER — Encounter (HOSPITAL_BASED_OUTPATIENT_CLINIC_OR_DEPARTMENT_OTHER): Payer: Self-pay | Admitting: Orthopedic Surgery

## 2018-05-13 DIAGNOSIS — Z800 Family history of malignant neoplasm of digestive organs: Secondary | ICD-10-CM | POA: Diagnosis not present

## 2018-05-14 ENCOUNTER — Encounter: Payer: Self-pay | Admitting: Physical Therapy

## 2018-05-14 ENCOUNTER — Ambulatory Visit: Payer: BLUE CROSS/BLUE SHIELD | Attending: Orthopedic Surgery | Admitting: Physical Therapy

## 2018-05-14 DIAGNOSIS — I890 Lymphedema, not elsewhere classified: Secondary | ICD-10-CM | POA: Insufficient documentation

## 2018-05-14 NOTE — Therapy (Signed)
[place], [place], [address] Phone: [phone]   Fax:  [phone]  Physical Therapy Evaluation  Patient Details  Name: Savannah [name] MRN: [id number] Date of Birth: Aug 12, 1956 Referring Provider: Dr. Lurline Del   Encounter Date: 05/14/2018  PT End of Session - 05/14/18 1157    Visit Number  1    Number of Visits  1    PT Start Time  1116    PT Stop Time  1200    PT Time Calculation (min)  44 min    Activity Tolerance  Patient tolerated treatment well    Behavior During Therapy  [facility] for tasks assessed/performed       Past Medical History:  Diagnosis Date  . Fibromyalgia   . Lymphedema   . Personal history of radiation therapy 2008  . PONV (postoperative nausea and vomiting)     Past Surgical History:  Procedure Laterality Date  . ABDOMINAL HYSTERECTOMY    . BREAST LUMPECTOMY Right 2008  . BREAST LUMPECTOMY  2008  . LAPAROSCOPY    . MASS EXCISION Right 04/30/2018   Procedure: EXCISION CYST DEBRIDEMENT INTERPHALANGEAL JOINT RIGHT THUMB;  Surgeon: Daryll Brod, MD;  Location: [facility];  Service: Orthopedics;  Laterality: Right;  . TONSILLECTOMY      There were no vitals filed for this visit.   Subjective Assessment - 05/14/18 1119    Subjective  Patient had a ganglion cyst removed on 04/30/18 on her right thumb. She was concerned about the possibility of her lymphedema getting worse as result of having surgery so she wanted to come and have her arm reassessed. Stitches were removed 05/12/18 and she will see her surgeon 06/10/18.    Pertinent History  Right breast lumpectomy 2008 with ALND with radiation. Hysterectomy 2008. Right arm lymphedema onset 12/08.    Patient Stated Goals  Make sure hand surgery didn't exaccerbate lymphedema.    Currently in Pain?  No/denies         [facility] PT Assessment - 05/14/18 0001      Assessment   Medical Diagnosis  Right arm  lymphedema    Referring Provider  Dr. Sarajane Jews Magrinat    Onset Date/Surgical Date  04/30/18    Hand Dominance  Right    Next MD Visit  06/10/18    Prior Therapy  Yes for lymphedema      Precautions   Precautions  Other (comment)    Precaution Comments  Hx right breast cancer      Restrictions   Weight Bearing Restrictions  No      Balance Screen   Has the patient fallen in the past 6 months  No    Has the patient had a decrease in activity level because of a fear of falling?   No    Is the patient reluctant to leave their home because of a fear of falling?   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      ROM / Strength   AROM / PROM / Strength  AROM;Strength      AROM   Overall AROM Comments  BUE WNL      Strength   Overall Strength  Within functional limits for tasks performed    Overall Strength Comments  BUE tested and are WNL        LYMPHEDEMA/ONCOLOGY QUESTIONNAIRE - 05/14/18 1130      Type   Cancer Type  Right breast      Surgeries   Lumpectomy Date  12/02/06    Axillary Lymph Node Dissection Date  12/02/06      Treatment   Active Chemotherapy Treatment  No    Past Chemotherapy Treatment  No    Active Radiation Treatment  No    Past Radiation Treatment  Yes    Body Site  right breast and axilla    Current Hormone Treatment  No      What other symptoms do you have   Are you Having Heaviness or Tightness  No    Are you having Pain  No    Are you having pitting edema  No     Is it Hard or Difficult finding clothes that fit  No    Do you have infections  No    Is there Decreased scar mobility  No    Stemmer Sign  No      Lymphedema Assessments   Lymphedema Assessments  Upper extremities      Right Upper Extremity Lymphedema   15 cm Proximal to Olecranon Process  29.2 cm    10 cm Proximal to Olecranon Process  26.2 cm    Olecranon Process  24.5 cm    15 cm Proximal to Ulnar Styloid Process  22.7 cm    10 cm Proximal to Ulnar Styloid Process  19.9 cm    Just Proximal to Ulnar Styloid Process  15.2 cm    Across Hand at [employer]  18.5 cm    At [place] of 2nd Digit  6.5 cm      Left Upper Extremity Lymphedema   15 cm Proximal to Olecranon Process  28.3 cm    10 cm Proximal to Olecranon Process  26 cm    Olecranon Process  23.6 cm    15 cm Proximal to Ulnar Styloid Process  22.4 cm    10 cm Proximal to Ulnar Styloid Process  19.4 cm    Just Proximal to Ulnar Styloid Process  14.6 cm    Across Hand at [employer]  18.2 cm    At [place] of 2nd Digit  6.2 cm             Outpatient Rehab from 05/14/2018 [facility]  Lymphedema Life Impact Scale Total Score  13.24 %      Objective measurements completed on examination: See above findings.              PT Education - 05/14/18 1156    Education Details  Where and how to be fitted for a custom glove and new sleeve    Person(s) Educated  Patient    Methods  Explanation    Comprehension  Verbalized understanding                  Plan - 05/14/18 1158  Clinical Impression Statement  Patient is doing well s/p right thumb surgery. There seems to be no side effects realted to lymphedema from having this surgery. There is mild swelling present in her right thumb at the surgery site but it is minimal. Measurements of BUE compared to those from last year and are slightly elevated in bilateral upper arms but she reports a 10# weight gain. There is  no need for PT at this time and she has been educated on where and how to obtain new compression garments.    History and Personal Factors relevant to plan of care:  None    Clinical Presentation  Stable    Clinical Decision Making  Low    Rehab Potential  Excellent    Clinical Impairments Affecting Rehab Potential  None    PT Frequency  One time visit    PT Treatment/Interventions  ADLs/Self Care Home Management;Patient/family education    PT Next Visit Plan  D/C - no need for PT    Consulted and Agree with Plan of Care  Patient       Patient will benefit from skilled therapeutic intervention in order to improve the following deficits and impairments:  Impaired UE functional use, Increased edema  Visit Diagnosis: Lymphedema, not elsewhere classified - Plan: PT plan of care cert/re-cert     Problem List Patient Active Problem List   Diagnosis Date Noted  . Malignant neoplasm of upper-inner quadrant of right breast in female, estrogen receptor positive ([place]) 01/03/2014  . Swelling of limb 07/13/2013    Annia Friendly, PT 05/14/18 12:06 PM  [place], [place], [address] Phone: [phone]   Fax:  [phone]  Name: Leslea Vowles MRN: [id number] Date of Birth: Dec 23, 1955

## 2018-05-18 ENCOUNTER — Other Ambulatory Visit: Payer: Self-pay | Admitting: Oncology

## 2018-05-18 DIAGNOSIS — Z1231 Encounter for screening mammogram for malignant neoplasm of breast: Secondary | ICD-10-CM

## 2018-05-20 DIAGNOSIS — Z6827 Body mass index (BMI) 27.0-27.9, adult: Secondary | ICD-10-CM | POA: Diagnosis not present

## 2018-05-20 DIAGNOSIS — Z01419 Encounter for gynecological examination (general) (routine) without abnormal findings: Secondary | ICD-10-CM | POA: Diagnosis not present

## 2018-05-25 DIAGNOSIS — Z23 Encounter for immunization: Secondary | ICD-10-CM | POA: Diagnosis not present

## 2018-05-28 ENCOUNTER — Other Ambulatory Visit: Payer: Self-pay | Admitting: Gastroenterology

## 2018-05-28 DIAGNOSIS — Z800 Family history of malignant neoplasm of digestive organs: Secondary | ICD-10-CM

## 2018-06-01 DIAGNOSIS — L821 Other seborrheic keratosis: Secondary | ICD-10-CM | POA: Diagnosis not present

## 2018-06-01 DIAGNOSIS — M1711 Unilateral primary osteoarthritis, right knee: Secondary | ICD-10-CM | POA: Diagnosis not present

## 2018-06-01 DIAGNOSIS — M1712 Unilateral primary osteoarthritis, left knee: Secondary | ICD-10-CM | POA: Diagnosis not present

## 2018-06-01 DIAGNOSIS — D225 Melanocytic nevi of trunk: Secondary | ICD-10-CM | POA: Diagnosis not present

## 2018-06-01 DIAGNOSIS — L814 Other melanin hyperpigmentation: Secondary | ICD-10-CM | POA: Diagnosis not present

## 2018-06-02 DIAGNOSIS — H60333 Swimmer's ear, bilateral: Secondary | ICD-10-CM | POA: Diagnosis not present

## 2018-06-10 DIAGNOSIS — M25661 Stiffness of right knee, not elsewhere classified: Secondary | ICD-10-CM | POA: Diagnosis not present

## 2018-06-10 DIAGNOSIS — M1711 Unilateral primary osteoarthritis, right knee: Secondary | ICD-10-CM | POA: Diagnosis not present

## 2018-06-10 DIAGNOSIS — M25562 Pain in left knee: Secondary | ICD-10-CM | POA: Diagnosis not present

## 2018-06-10 DIAGNOSIS — M25561 Pain in right knee: Secondary | ICD-10-CM | POA: Diagnosis not present

## 2018-06-10 DIAGNOSIS — M25662 Stiffness of left knee, not elsewhere classified: Secondary | ICD-10-CM | POA: Diagnosis not present

## 2018-06-10 DIAGNOSIS — M1712 Unilateral primary osteoarthritis, left knee: Secondary | ICD-10-CM | POA: Diagnosis not present

## 2018-06-12 ENCOUNTER — Ambulatory Visit
Admission: RE | Admit: 2018-06-12 | Discharge: 2018-06-12 | Disposition: A | Discharge status: Home / Self Care | Payer: BLUE CROSS/BLUE SHIELD | Source: Ambulatory Visit | Attending: Oncology | Admitting: Oncology

## 2018-06-12 DIAGNOSIS — Z1231 Encounter for screening mammogram for malignant neoplasm of breast: Secondary | ICD-10-CM | POA: Diagnosis not present

## 2018-06-25 DIAGNOSIS — M25662 Stiffness of left knee, not elsewhere classified: Secondary | ICD-10-CM | POA: Diagnosis not present

## 2018-06-25 DIAGNOSIS — M25561 Pain in right knee: Secondary | ICD-10-CM | POA: Diagnosis not present

## 2018-06-25 DIAGNOSIS — M25562 Pain in left knee: Secondary | ICD-10-CM | POA: Diagnosis not present

## 2018-06-25 DIAGNOSIS — M25661 Stiffness of right knee, not elsewhere classified: Secondary | ICD-10-CM | POA: Diagnosis not present

## 2018-06-30 ENCOUNTER — Ambulatory Visit
Admission: RE | Admit: 2018-06-30 | Discharge: 2018-06-30 | Disposition: A | Discharge status: Home / Self Care | Payer: BLUE CROSS/BLUE SHIELD | Source: Ambulatory Visit | Attending: Gastroenterology | Admitting: Gastroenterology

## 2018-06-30 DIAGNOSIS — N200 Calculus of kidney: Secondary | ICD-10-CM | POA: Diagnosis not present

## 2018-06-30 DIAGNOSIS — Z800 Family history of malignant neoplasm of digestive organs: Secondary | ICD-10-CM

## 2018-06-30 LAB — HM COLONOSCOPY

## 2018-07-01 DIAGNOSIS — C50919 Malignant neoplasm of unspecified site of unspecified female breast: Secondary | ICD-10-CM | POA: Diagnosis not present

## 2018-07-02 DIAGNOSIS — C50211 Malignant neoplasm of upper-inner quadrant of right female breast: Secondary | ICD-10-CM | POA: Diagnosis not present

## 2018-07-02 DIAGNOSIS — M179 Osteoarthritis of knee, unspecified: Secondary | ICD-10-CM | POA: Diagnosis not present

## 2018-07-02 DIAGNOSIS — Z800 Family history of malignant neoplasm of digestive organs: Secondary | ICD-10-CM | POA: Diagnosis not present

## 2018-07-03 DIAGNOSIS — Z01812 Encounter for preprocedural laboratory examination: Secondary | ICD-10-CM | POA: Diagnosis not present

## 2018-07-15 DIAGNOSIS — M1711 Unilateral primary osteoarthritis, right knee: Secondary | ICD-10-CM | POA: Diagnosis not present

## 2018-07-15 DIAGNOSIS — M1712 Unilateral primary osteoarthritis, left knee: Secondary | ICD-10-CM | POA: Diagnosis not present

## 2018-07-15 DIAGNOSIS — E785 Hyperlipidemia, unspecified: Secondary | ICD-10-CM | POA: Diagnosis not present

## 2018-07-15 DIAGNOSIS — Z1159 Encounter for screening for other viral diseases: Secondary | ICD-10-CM | POA: Diagnosis not present

## 2018-07-23 DIAGNOSIS — Z96651 Presence of right artificial knee joint: Secondary | ICD-10-CM | POA: Diagnosis not present

## 2018-07-23 DIAGNOSIS — M1712 Unilateral primary osteoarthritis, left knee: Secondary | ICD-10-CM | POA: Diagnosis not present

## 2018-07-23 DIAGNOSIS — M170 Bilateral primary osteoarthritis of knee: Secondary | ICD-10-CM | POA: Diagnosis not present

## 2018-07-28 DIAGNOSIS — M1712 Unilateral primary osteoarthritis, left knee: Secondary | ICD-10-CM | POA: Diagnosis not present

## 2018-07-29 DIAGNOSIS — M1712 Unilateral primary osteoarthritis, left knee: Secondary | ICD-10-CM | POA: Diagnosis not present

## 2018-07-30 DIAGNOSIS — M1712 Unilateral primary osteoarthritis, left knee: Secondary | ICD-10-CM | POA: Diagnosis not present

## 2018-08-01 DIAGNOSIS — M25562 Pain in left knee: Secondary | ICD-10-CM | POA: Diagnosis not present

## 2018-08-04 DIAGNOSIS — M5136 Other intervertebral disc degeneration, lumbar region: Secondary | ICD-10-CM | POA: Diagnosis not present

## 2018-08-04 DIAGNOSIS — M50323 Other cervical disc degeneration at C6-C7 level: Secondary | ICD-10-CM | POA: Diagnosis not present

## 2018-08-04 DIAGNOSIS — M9902 Segmental and somatic dysfunction of thoracic region: Secondary | ICD-10-CM | POA: Diagnosis not present

## 2018-08-04 DIAGNOSIS — M9901 Segmental and somatic dysfunction of cervical region: Secondary | ICD-10-CM | POA: Diagnosis not present

## 2018-08-05 DIAGNOSIS — M1712 Unilateral primary osteoarthritis, left knee: Secondary | ICD-10-CM | POA: Diagnosis not present

## 2018-08-06 DIAGNOSIS — M9901 Segmental and somatic dysfunction of cervical region: Secondary | ICD-10-CM | POA: Diagnosis not present

## 2018-08-06 DIAGNOSIS — M1712 Unilateral primary osteoarthritis, left knee: Secondary | ICD-10-CM | POA: Diagnosis not present

## 2018-08-06 DIAGNOSIS — M5136 Other intervertebral disc degeneration, lumbar region: Secondary | ICD-10-CM | POA: Diagnosis not present

## 2018-08-06 DIAGNOSIS — M50323 Other cervical disc degeneration at C6-C7 level: Secondary | ICD-10-CM | POA: Diagnosis not present

## 2018-08-07 DIAGNOSIS — M1712 Unilateral primary osteoarthritis, left knee: Secondary | ICD-10-CM | POA: Diagnosis not present

## 2018-08-07 DIAGNOSIS — M25662 Stiffness of left knee, not elsewhere classified: Secondary | ICD-10-CM | POA: Diagnosis not present

## 2018-08-07 DIAGNOSIS — M25562 Pain in left knee: Secondary | ICD-10-CM | POA: Diagnosis not present

## 2018-08-07 DIAGNOSIS — R262 Difficulty in walking, not elsewhere classified: Secondary | ICD-10-CM | POA: Diagnosis not present

## 2018-08-10 DIAGNOSIS — M1712 Unilateral primary osteoarthritis, left knee: Secondary | ICD-10-CM | POA: Diagnosis not present

## 2018-08-11 DIAGNOSIS — E785 Hyperlipidemia, unspecified: Secondary | ICD-10-CM | POA: Diagnosis not present

## 2018-08-11 DIAGNOSIS — M50323 Other cervical disc degeneration at C6-C7 level: Secondary | ICD-10-CM | POA: Diagnosis not present

## 2018-08-11 DIAGNOSIS — R3915 Urgency of urination: Secondary | ICD-10-CM | POA: Diagnosis not present

## 2018-08-11 DIAGNOSIS — M5136 Other intervertebral disc degeneration, lumbar region: Secondary | ICD-10-CM | POA: Diagnosis not present

## 2018-08-11 DIAGNOSIS — R1030 Lower abdominal pain, unspecified: Secondary | ICD-10-CM | POA: Diagnosis not present

## 2018-08-11 DIAGNOSIS — M9902 Segmental and somatic dysfunction of thoracic region: Secondary | ICD-10-CM | POA: Diagnosis not present

## 2018-08-11 DIAGNOSIS — M9901 Segmental and somatic dysfunction of cervical region: Secondary | ICD-10-CM | POA: Diagnosis not present

## 2018-08-13 DIAGNOSIS — M50323 Other cervical disc degeneration at C6-C7 level: Secondary | ICD-10-CM | POA: Diagnosis not present

## 2018-08-13 DIAGNOSIS — M1712 Unilateral primary osteoarthritis, left knee: Secondary | ICD-10-CM | POA: Diagnosis not present

## 2018-08-13 DIAGNOSIS — M5136 Other intervertebral disc degeneration, lumbar region: Secondary | ICD-10-CM | POA: Diagnosis not present

## 2018-08-13 DIAGNOSIS — M9902 Segmental and somatic dysfunction of thoracic region: Secondary | ICD-10-CM | POA: Diagnosis not present

## 2018-08-14 DIAGNOSIS — M1712 Unilateral primary osteoarthritis, left knee: Secondary | ICD-10-CM | POA: Diagnosis not present

## 2018-08-17 DIAGNOSIS — M1712 Unilateral primary osteoarthritis, left knee: Secondary | ICD-10-CM | POA: Diagnosis not present

## 2018-08-18 DIAGNOSIS — M50323 Other cervical disc degeneration at C6-C7 level: Secondary | ICD-10-CM | POA: Diagnosis not present

## 2018-08-18 DIAGNOSIS — M9901 Segmental and somatic dysfunction of cervical region: Secondary | ICD-10-CM | POA: Diagnosis not present

## 2018-08-18 DIAGNOSIS — M9902 Segmental and somatic dysfunction of thoracic region: Secondary | ICD-10-CM | POA: Diagnosis not present

## 2018-08-18 DIAGNOSIS — M5136 Other intervertebral disc degeneration, lumbar region: Secondary | ICD-10-CM | POA: Diagnosis not present

## 2018-08-19 DIAGNOSIS — M1712 Unilateral primary osteoarthritis, left knee: Secondary | ICD-10-CM | POA: Diagnosis not present

## 2018-08-19 DIAGNOSIS — M6740 Ganglion, unspecified site: Secondary | ICD-10-CM | POA: Diagnosis not present

## 2018-08-20 DIAGNOSIS — M50323 Other cervical disc degeneration at C6-C7 level: Secondary | ICD-10-CM | POA: Diagnosis not present

## 2018-08-20 DIAGNOSIS — M9901 Segmental and somatic dysfunction of cervical region: Secondary | ICD-10-CM | POA: Diagnosis not present

## 2018-08-20 DIAGNOSIS — M5136 Other intervertebral disc degeneration, lumbar region: Secondary | ICD-10-CM | POA: Diagnosis not present

## 2018-08-21 DIAGNOSIS — M1712 Unilateral primary osteoarthritis, left knee: Secondary | ICD-10-CM | POA: Diagnosis not present

## 2018-08-24 DIAGNOSIS — M1712 Unilateral primary osteoarthritis, left knee: Secondary | ICD-10-CM | POA: Diagnosis not present

## 2018-08-26 DIAGNOSIS — M1712 Unilateral primary osteoarthritis, left knee: Secondary | ICD-10-CM | POA: Diagnosis not present

## 2018-08-28 DIAGNOSIS — M1712 Unilateral primary osteoarthritis, left knee: Secondary | ICD-10-CM | POA: Diagnosis not present

## 2018-08-31 DIAGNOSIS — M1712 Unilateral primary osteoarthritis, left knee: Secondary | ICD-10-CM | POA: Diagnosis not present

## 2018-09-03 DIAGNOSIS — M1712 Unilateral primary osteoarthritis, left knee: Secondary | ICD-10-CM | POA: Diagnosis not present

## 2018-09-04 DIAGNOSIS — Z96652 Presence of left artificial knee joint: Secondary | ICD-10-CM | POA: Diagnosis not present

## 2018-09-04 DIAGNOSIS — M1712 Unilateral primary osteoarthritis, left knee: Secondary | ICD-10-CM | POA: Diagnosis not present

## 2018-09-11 DIAGNOSIS — M1712 Unilateral primary osteoarthritis, left knee: Secondary | ICD-10-CM | POA: Diagnosis not present

## 2018-09-11 DIAGNOSIS — C50919 Malignant neoplasm of unspecified site of unspecified female breast: Secondary | ICD-10-CM | POA: Diagnosis not present

## 2018-09-18 DIAGNOSIS — M25841 Other specified joint disorders, right hand: Secondary | ICD-10-CM | POA: Diagnosis not present

## 2018-09-18 DIAGNOSIS — M19041 Primary osteoarthritis, right hand: Secondary | ICD-10-CM | POA: Diagnosis not present

## 2018-10-07 DIAGNOSIS — M1711 Unilateral primary osteoarthritis, right knee: Secondary | ICD-10-CM | POA: Diagnosis not present

## 2018-10-13 ENCOUNTER — Telehealth: Payer: Self-pay | Admitting: Internal Medicine

## 2018-10-13 NOTE — Telephone Encounter (Signed)
Pt wants to switch providers because she wasnt feeling the doctor at [facility] was helping and they did not let her see other doctor at the practice. A friend of her highly recommend Dr. Tonye Royalty. She is having a lot of problems with constant diarrhea. She need an office visit. Record place at the office to be review.

## 2018-10-14 DIAGNOSIS — Z01812 Encounter for preprocedural laboratory examination: Secondary | ICD-10-CM | POA: Diagnosis not present

## 2018-10-22 DIAGNOSIS — M1711 Unilateral primary osteoarthritis, right knee: Secondary | ICD-10-CM | POA: Diagnosis not present

## 2018-10-22 DIAGNOSIS — Z96651 Presence of right artificial knee joint: Secondary | ICD-10-CM | POA: Diagnosis not present

## 2018-10-27 DIAGNOSIS — M1711 Unilateral primary osteoarthritis, right knee: Secondary | ICD-10-CM | POA: Diagnosis not present

## 2018-10-28 DIAGNOSIS — M1711 Unilateral primary osteoarthritis, right knee: Secondary | ICD-10-CM | POA: Diagnosis not present

## 2018-11-02 DIAGNOSIS — M1711 Unilateral primary osteoarthritis, right knee: Secondary | ICD-10-CM | POA: Diagnosis not present

## 2018-11-03 ENCOUNTER — Other Ambulatory Visit (HOSPITAL_COMMUNITY): Payer: Self-pay | Admitting: Orthopaedic Surgery

## 2018-11-03 ENCOUNTER — Ambulatory Visit (HOSPITAL_COMMUNITY)
Admission: RE | Admit: 2018-11-03 | Discharge: 2018-11-03 | Disposition: A | Discharge status: Home / Self Care | Payer: BLUE CROSS/BLUE SHIELD | Source: Ambulatory Visit | Attending: Orthopaedic Surgery | Admitting: Orthopaedic Surgery

## 2018-11-03 DIAGNOSIS — M1711 Unilateral primary osteoarthritis, right knee: Secondary | ICD-10-CM | POA: Diagnosis not present

## 2018-11-03 DIAGNOSIS — M7989 Other specified soft tissue disorders: Secondary | ICD-10-CM

## 2018-11-03 DIAGNOSIS — M79604 Pain in right leg: Secondary | ICD-10-CM | POA: Diagnosis not present

## 2018-11-03 NOTE — Progress Notes (Signed)
Right lower extremity duplex completed. Results in CV Proc.  Results given to DR. Abram Sander 11/03/2018 4:36 PM

## 2018-11-04 DIAGNOSIS — T23211A Burn of second degree of right thumb (nail), initial encounter: Secondary | ICD-10-CM | POA: Diagnosis not present

## 2018-11-05 DIAGNOSIS — M1711 Unilateral primary osteoarthritis, right knee: Secondary | ICD-10-CM | POA: Diagnosis not present

## 2018-11-06 DIAGNOSIS — M1711 Unilateral primary osteoarthritis, right knee: Secondary | ICD-10-CM | POA: Diagnosis not present

## 2018-11-09 DIAGNOSIS — M1711 Unilateral primary osteoarthritis, right knee: Secondary | ICD-10-CM | POA: Diagnosis not present

## 2018-11-11 DIAGNOSIS — M1711 Unilateral primary osteoarthritis, right knee: Secondary | ICD-10-CM | POA: Diagnosis not present

## 2018-11-13 DIAGNOSIS — M1711 Unilateral primary osteoarthritis, right knee: Secondary | ICD-10-CM | POA: Diagnosis not present

## 2018-11-16 DIAGNOSIS — M1711 Unilateral primary osteoarthritis, right knee: Secondary | ICD-10-CM | POA: Diagnosis not present

## 2018-11-16 DIAGNOSIS — Z96653 Presence of artificial knee joint, bilateral: Secondary | ICD-10-CM | POA: Diagnosis not present

## 2018-11-19 DIAGNOSIS — M1711 Unilateral primary osteoarthritis, right knee: Secondary | ICD-10-CM | POA: Diagnosis not present

## 2018-11-19 DIAGNOSIS — M179 Osteoarthritis of knee, unspecified: Secondary | ICD-10-CM | POA: Diagnosis not present

## 2018-11-19 DIAGNOSIS — R1030 Lower abdominal pain, unspecified: Secondary | ICD-10-CM | POA: Diagnosis not present

## 2018-11-19 DIAGNOSIS — Z0000 Encounter for general adult medical examination without abnormal findings: Secondary | ICD-10-CM | POA: Diagnosis not present

## 2018-11-19 DIAGNOSIS — Z23 Encounter for immunization: Secondary | ICD-10-CM | POA: Diagnosis not present

## 2018-11-19 DIAGNOSIS — C50211 Malignant neoplasm of upper-inner quadrant of right female breast: Secondary | ICD-10-CM | POA: Diagnosis not present

## 2018-11-23 DIAGNOSIS — M1711 Unilateral primary osteoarthritis, right knee: Secondary | ICD-10-CM | POA: Diagnosis not present

## 2018-11-26 DIAGNOSIS — M1711 Unilateral primary osteoarthritis, right knee: Secondary | ICD-10-CM | POA: Diagnosis not present

## 2018-12-01 DIAGNOSIS — M1711 Unilateral primary osteoarthritis, right knee: Secondary | ICD-10-CM | POA: Diagnosis not present

## 2018-12-07 NOTE — Telephone Encounter (Signed)
Patient returning vm. Patients records were reviewed and recall put in system on 11.21.19. Records were placed in review folder. Records now sent to Dr.Pyrtle again and pt scheduled for 1.13.2020.

## 2018-12-07 NOTE — Telephone Encounter (Signed)
Neither I nor Dr Hilarie Fredrickson have seen records and they are not in his office for review.

## 2018-12-07 NOTE — Telephone Encounter (Signed)
Savannah [name] or Dr. Hilarie Fredrickson, Have you seen these records?  Patient is calling in "begging" to schedule an appointment because she has been waiting since November for her records to be reviewed.  Please advice?  Thanks

## 2018-12-07 NOTE — Telephone Encounter (Signed)
LM on Vmail to call back to schedule and let patient know we will need her previous GI records

## 2018-12-07 NOTE — Telephone Encounter (Signed)
I am okay accepting the patient for consult However we do need to obtain records from prior gastroenterology care If long wait for a visit with me can be seen by app

## 2018-12-08 DIAGNOSIS — M1711 Unilateral primary osteoarthritis, right knee: Secondary | ICD-10-CM | POA: Diagnosis not present

## 2018-12-09 ENCOUNTER — Encounter: Payer: Self-pay | Admitting: *Deleted

## 2018-12-10 DIAGNOSIS — M1711 Unilateral primary osteoarthritis, right knee: Secondary | ICD-10-CM | POA: Diagnosis not present

## 2018-12-14 ENCOUNTER — Other Ambulatory Visit (INDEPENDENT_AMBULATORY_CARE_PROVIDER_SITE_OTHER): Payer: BLUE CROSS/BLUE SHIELD

## 2018-12-14 ENCOUNTER — Encounter: Payer: Self-pay | Admitting: Internal Medicine

## 2018-12-14 ENCOUNTER — Ambulatory Visit (INDEPENDENT_AMBULATORY_CARE_PROVIDER_SITE_OTHER): Payer: BLUE CROSS/BLUE SHIELD | Admitting: Internal Medicine

## 2018-12-14 VITALS — BP 126/70 | HR 92 | Ht 66.0 in | Wt 151.0 lb

## 2018-12-14 DIAGNOSIS — R197 Diarrhea, unspecified: Secondary | ICD-10-CM | POA: Diagnosis not present

## 2018-12-14 DIAGNOSIS — Q438 Other specified congenital malformations of intestine: Secondary | ICD-10-CM

## 2018-12-14 DIAGNOSIS — Z800 Family history of malignant neoplasm of digestive organs: Secondary | ICD-10-CM | POA: Diagnosis not present

## 2018-12-14 DIAGNOSIS — M1711 Unilateral primary osteoarthritis, right knee: Secondary | ICD-10-CM | POA: Diagnosis not present

## 2018-12-14 LAB — COMPREHENSIVE METABOLIC PANEL
ALT: 15 U/L (ref 0–35)
AST: 17 U/L (ref 0–37)
Albumin: 4.1 g/dL (ref 3.5–5.2)
Alkaline Phosphatase: 55 U/L (ref 39–117)
BUN: 5 mg/dL — ABNORMAL LOW (ref 6–23)
CO2: 30 mEq/L (ref 19–32)
Calcium: 9.4 mg/dL (ref 8.4–10.5)
Chloride: 104 mEq/L (ref 96–112)
Creatinine, Ser: 0.65 mg/dL (ref 0.40–1.20)
GFR: 97.82 mL/min (ref >60.00)
Glucose, Bld: 72 mg/dL (ref 70–99)
Potassium: 3.8 mEq/L (ref 3.5–5.1)
Sodium: 141 mEq/L (ref 135–145)
Total Bilirubin: 0.3 mg/dL (ref 0.2–1.2)
Total Protein: 6.8 g/dL (ref 6.0–8.3)

## 2018-12-14 LAB — CBC WITH DIFFERENTIAL/PLATELET
BASOS PCT: 1.2 % (ref 0.0–3.0)
Basophils Absolute: 0.1 10*3/uL (ref 0.0–0.1)
EOS ABS: 0 10*3/uL (ref 0.0–0.7)
Eosinophils Relative: 0.6 % (ref 0.0–5.0)
HCT: 34.6 % — ABNORMAL LOW (ref 36.0–46.0)
Hemoglobin: 11.6 g/dL — ABNORMAL LOW (ref 12.0–15.0)
Lymphocytes Relative: 16.6 % (ref 12.0–46.0)
Lymphs Abs: 0.9 10*3/uL (ref 0.7–4.0)
MCHC: 33.5 g/dL (ref 30.0–36.0)
MCV: 81.7 fl (ref 78.0–100.0)
Monocytes Absolute: 0.4 10*3/uL (ref 0.1–1.0)
Monocytes Relative: 7.6 % (ref 3.0–12.0)
Neutro Abs: 3.9 10*3/uL (ref 1.4–7.7)
Neutrophils Relative %: 74 % (ref 43.0–77.0)
Platelets: 314 10*3/uL (ref 150.0–400.0)
RBC: 4.23 Mil/uL (ref 3.87–5.11)
RDW: 13.7 % (ref 11.5–15.5)
WBC: 5.3 10*3/uL (ref 4.0–10.5)

## 2018-12-14 LAB — IGA: IgA: 107 mg/dL (ref 68–378)

## 2018-12-14 LAB — TSH: TSH: 0.63 u[IU]/mL (ref 0.35–4.50)

## 2018-12-14 NOTE — Progress Notes (Signed)
Patient ID: Savannah [name], female   DOB: Feb 01, 1956, 63 y.o.   MRN: [id number] HPI: Savannah [name] is a 63 year old female with a past medical history of GERD, family history of rectal cancer in her mother, history of breast cancer, hyperlipidemia, history of bilateral total knee replacement in 2019, prior cholecystectomy who seen in consult at the request of Dr. Fara Olden to evaluate chronic loose stools/diarrhea.  She is here today with a friend.  She reports that she has a somewhat longstanding history of constipation.  Dating back to when she was a child she remembers irregular bowel movements with lower abdominal pain.  This caused her to miss days of school.  As she is aged her diet has improved and she reports more regular bowel movements which for her is a bowel movement every several days which is formed.  No rectal bleeding or melena.  However, since July 2019 after a virtual colonoscopy was performed for history of incomplete colonoscopy, she has had episodic diarrhea.  She recalls for extended bouts of diarrhea in the last 6 months with a 17 pound weight loss.  The first episode again followed a virtual colonoscopy and lasted 26 days, ending on 07/21/2018.  She then had a 17-day bout from 8/31 to 08/17/2018, 16-day bout from 10/6 to 09/21/2018 and a 16-day bout from 11/27/2018 to 12/12/2018.  In August she took Pepto-Bismol for a week and then Imodium until the diarrhea finally stopped.  When using Imodium she will use 4 mg in the morning followed by 2 mg until diarrhea improved.  This usually took about 8 mg total.  Over the last several days her bowel movements have gone back to normal without further loose stools in the morning.  Again no abdominal pain or rectal bleeding.  About 2-1/2 years ago she adopted a gluten-free diet.  Immediately after this she felt improvement in acid reflux but also abdominal bloating and discomfort.  She now tries to follow a strict gluten-free diet and wonders  however if the episodes described above could have been due to inadvertent gluten exposure.  She also had improvement in runny or stuffy nose, postnasal drip and cough after going gluten-free.  Again family history mother died of rectal cancer at 55.  Her father died of a heart attack at age 90.  She has an uncle with Crohn's disease.  She had a colonoscopy performed by Dr. Amedeo Plenty on 02/22/2013 --tortuous colon.  Exam to the hepatic flexure.  Other than tortuosity the exam was normal.  Barium enema was then performed due to incomplete colonoscopy on 02/22/2013 and this showed markedly tortuous colon.  No definite polyp, mass or stricture.  Virtual colonoscopy, see below was again 06/30/2018 and showed a markedly tortuous colon.  No visible or persistent polypoid filling defects or annular constricting lesions.  CT portion did not show any acute extracolonic abnormality.  Evidence of prior cholecystectomy and hysterectomy.  Right renal stone which was nonobstructing.  Past Medical History:  Diagnosis Date  . Adhesive capsulitis   . BPPV (benign paroxysmal positional vertigo)   . Breast cancer ([place])   . Endometriosis   . Fibromyalgia   . GERD (gastroesophageal reflux disease)   . Hyperlipidemia   . Lymphedema   . Malaria 1996  . Personal history of radiation therapy 2008  . PONV (postoperative nausea and vomiting)   . Shingles 2002    Past Surgical History:  Procedure Laterality Date  . BILATERAL SALPINGOOPHORECTOMY    . BREAST LUMPECTOMY Right 2008  with right axillary node resection  . CHOLECYSTECTOMY    . LAPAROSCOPY    . MASS EXCISION Right 04/30/2018   Procedure: EXCISION CYST DEBRIDEMENT INTERPHALANGEAL JOINT RIGHT THUMB;  Surgeon: Daryll Brod, MD;  Location: [place];  Service: Orthopedics;  Laterality: Right;  . TONSILLECTOMY    . TOTAL KNEE ARTHROPLASTY Bilateral 07/2018, 10/2018  . VAGINAL HYSTERECTOMY      Outpatient Medications Prior to Visit   Medication Sig Dispense Refill  . cetirizine (ZYRTEC) 10 MG chewable tablet Chew 10 mg by mouth daily.    . cholecalciferol (VITAMIN D) 1000 UNITS tablet Take 1,000 Units by mouth daily.    . cyclobenzaprine (FLEXERIL) 10 MG tablet Take 10 mg by mouth daily as needed.    . famotidine (PEPCID) 20 MG tablet Take 20 mg by mouth 2 (two) times daily.    . fexofenadine (ALLEGRA) 180 MG tablet Take 180 mg by mouth daily.    . fluticasone (FLONASE) 50 MCG/ACT nasal spray Place 2 sprays into both nostrils daily. (Patient taking differently: Place 2 sprays into both nostrils daily as needed. ) 16 g 6  . HYDROcodone-acetaminophen (NORCO) 5-325 MG tablet Take 1 tablet by mouth every 6 (six) hours as needed. 20 tablet 0   No facility-administered medications prior to visit.     Allergies  Allergen Reactions  . Darvon Itching and Nausea Only  . Propoxyphene Itching and Other (See Comments)  . Ultram [Tramadol Hcl]     Family History  Problem Relation Age of Onset  . Rectal cancer Mother   . Stroke Mother   . Heart disease Father   . Heart attack Father   . Crohn's disease Maternal Uncle   . Liver disease Neg Hx   . Colon polyps Neg Hx     Social History   Tobacco Use  . Smoking status: Never Smoker  . Smokeless tobacco: Never Used  Substance Use Topics  . Alcohol use: Yes    Comment: social-2+ drinks per week/ not currently drinking in the last 6 months.   . Drug use: Never    ROS: As per history of present illness, otherwise negative  BP 126/70   Pulse 92   Ht 5\' 6"  (1.676 m)   Wt 151 lb (68.5 kg)   BMI 24.37 kg/m  Constitutional: Well-developed and well-nourished. No distress. HEENT: Normocephalic and atraumatic. Oropharynx is clear and moist. Conjunctivae are normal.  No scleral icterus. Neck: Neck supple. Trachea midline. Cardiovascular: Normal rate, regular rhythm and intact distal pulses. No M/R/G Pulmonary/chest: Effort normal and breath sounds normal. No wheezing,  rales or rhonchi. Abdominal: Soft, nontender, nondistended. Bowel sounds active throughout. There are no masses palpable. No hepatosplenomegaly. Extremities: no clubbing, cyanosis, or edema Neurological: Alert and oriented to person place and time. Skin: Skin is warm and dry.  Psychiatric: Normal mood and affect. Behavior is normal.  RELEVANT LABS AND IMAGING: CBC    Component Value Date/Time   WBC 5.3 12/14/2018 1144   RBC 4.23 12/14/2018 1144   HGB 11.6 (L) 12/14/2018 1144   HGB 12.9 12/24/2017 1134   HGB 12.0 01/29/2017 1128   HCT 34.6 (L) 12/14/2018 1144   HCT 36.7 01/29/2017 1128   PLT 314.0 12/14/2018 1144   PLT 251 12/24/2017 1134   PLT 234 01/29/2017 1128   MCV 81.7 12/14/2018 1144   MCV 85.2 01/29/2017 1128   MCH 28.7 12/24/2017 1134   MCHC 33.5 12/14/2018 1144   RDW 13.7 12/14/2018 1144  RDW 13.7 01/29/2017 1128   LYMPHSABS 0.9 12/14/2018 1144   LYMPHSABS 1.3 01/29/2017 1128   MONOABS 0.4 12/14/2018 1144   MONOABS 0.5 01/29/2017 1128   EOSABS 0.0 12/14/2018 1144   EOSABS 0.1 01/29/2017 1128   BASOSABS 0.1 12/14/2018 1144   BASOSABS 0.0 01/29/2017 1128    CMP     Component Value Date/Time   NA 141 12/14/2018 1144   NA 142 01/29/2017 1128   K 3.8 12/14/2018 1144   K 4.2 01/29/2017 1128   CL 104 12/14/2018 1144   CL 106 12/24/2012 1401   CO2 30 12/14/2018 1144   CO2 27 01/29/2017 1128   GLUCOSE 72 12/14/2018 1144   GLUCOSE 88 01/29/2017 1128   GLUCOSE 96 12/24/2012 1401   BUN 5 (L) 12/14/2018 1144   BUN 14.0 01/29/2017 1128   CREATININE 0.65 12/14/2018 1144   CREATININE 0.77 12/24/2017 1134   CREATININE 0.9 01/29/2017 1128   CALCIUM 9.4 12/14/2018 1144   CALCIUM 9.6 01/29/2017 1128   PROT 6.8 12/14/2018 1144   PROT 6.6 01/29/2017 1128   ALBUMIN 4.1 12/14/2018 1144   ALBUMIN 3.7 01/29/2017 1128   AST 17 12/14/2018 1144   AST 20 12/24/2017 1134   AST 22 01/29/2017 1128   ALT 15 12/14/2018 1144   ALT 17 12/24/2017 1134   ALT 23 01/29/2017 1128    ALKPHOS 55 12/14/2018 1144   ALKPHOS 68 01/29/2017 1128   BILITOT 0.3 12/14/2018 1144   BILITOT 0.4 12/24/2017 1134   BILITOT 0.42 01/29/2017 1128   GFRNONAA >60 12/24/2017 1134   GFRAA >60 12/24/2017 1134   CT VIRTUAL COLONOSCOPY SCREENING   TECHNIQUE: The patient was given a standard bowel preparation with Gastrografin and barium for fluid and stool tagging respectively. The quality of the bowel preparation is moderate. Automated CO2 insufflation of the colon was performed prior to image acquisition and colonic distention is excellent. Image post processing was used to generate a 3D endoluminal fly-through projection of the colon and to electronically subtract stool/fluid as appropriate.   COMPARISON:  02/22/2013 barium enema.   FINDINGS: VIRTUAL COLONOSCOPY   Markedly tortuous colon. Moderate retained layering barium in the colon. No visible persistent non barium tagged polypoid filling defects or annular constricting lesions.   Virtual colonoscopy is not designed to detect diminutive polyps (i.e., less than or equal to 5 mm), the presence or absence of which may not affect clinical management.   CT ABDOMEN AND PELVIS WITHOUT CONTRAST   Lower chest: Lung bases are clear. No effusions. Heart is normal size.   Hepatobiliary: No focal liver abnormality is seen. Status post cholecystectomy. No biliary dilatation.   Pancreas: No focal abnormality or ductal dilatation.   Spleen: No focal abnormality.  Normal size.   Adrenals/Urinary Tract: 9 mm nonobstructing stone in the midpole of the right kidney. No ureteral stones or hydronephrosis. Adrenal glands and urinary bladder unremarkable.   Stomach/Bowel: Stomach and small bowel decompressed.   Vascular/Lymphatic: No evidence of aneurysm or adenopathy.   Reproductive: Prior hysterectomy.  No adnexal masses.   Other: No free fluid or free air.   Musculoskeletal: No acute bony abnormality.   IMPRESSION: Markedly  tortuous colon. No persistent polypoid filling defects or annular constricting lesions.   No acute extra colonic abnormality.   Prior cholecystectomy and hysterectomy.   Right renal nephrolithiasis, nonobstructing.     Electronically Signed   By: Rolm Baptise M.D.   On: 06/30/2018 09:40    ASSESSMENT/PLAN: 63 year old female with a  past medical history of GERD, family history of rectal cancer in her mother, history of breast cancer, hyperlipidemia, history of bilateral total knee replacement in 2019, prior cholecystectomy who seen in consult at the request of Dr. Fara Olden to evaluate chronic loose stools/diarrhea.   1.  Episodic diarrhea --occurring over the last 6 months.  It is possible that she has celiac disease in her diarrhea is resulting from gluten exposure.  We will check a TTG and IgA level today.  This does not sound infectious given the episodic nature.  This could possibly be due to bacterial overgrowth, irritable bowel, or be related to her colonic anatomy with her markedly tortuous colon with an overflow diarrhea type situation.  For now given improvement we are going to proceed with lab testing but if she has recurrent diarrhea I would recommend she have stool studies to include GI pathogen panel, fecal leukocytes and fecal elastase.  Would also consider empiric rifaximin therapy.  She can continue her probiotic at this time.  --Celiac panel, TSH, CBC, CMP  2.  Colon cancer screening/family history of rectal cancer --the virtual colonoscopy was very difficult for her given the resulting diarrhea which she associates with this test.  We discussed possibly Cologuard in July 2024 as a surrogate for virtual colonoscopy given her tortuous colon and incomplete colonoscopy history.  We discussed how Cologuard does not have an indication for family history of colon cancer but given her unique situation I think this would be a reasonable test.   JJ:[id number], [place], Md [address] [place], [place] [address]

## 2018-12-14 NOTE — Patient Instructions (Signed)
If you are age 64 or older, your body mass index should be between 23-30. Your Body mass index is 24.37 kg/m. If this is out of the aforementioned range listed, please consider follow up with your Primary Care Provider.  If you are age 30 or younger, your body mass index should be between 19-25. Your Body mass index is 24.37 kg/m. If this is out of the aformentioned range listed, please consider follow up with your Primary Care Provider.    Your provider has requested that you go to the basement level for lab work before leaving today. Press "B" on the elevator. The lab is located at the first door on the left as you exit the elevator.  Please follow up with Dr. Hilarie Fredrickson in 6 months.   Please contact office in the event of another diarrhea attack.

## 2018-12-15 LAB — TISSUE TRANSGLUTAMINASE, IGA: (tTG) Ab, IgA: 1 U/mL

## 2018-12-16 DIAGNOSIS — Z96653 Presence of artificial knee joint, bilateral: Secondary | ICD-10-CM | POA: Diagnosis not present

## 2018-12-16 DIAGNOSIS — M7061 Trochanteric bursitis, right hip: Secondary | ICD-10-CM | POA: Diagnosis not present

## 2018-12-16 DIAGNOSIS — Z471 Aftercare following joint replacement surgery: Secondary | ICD-10-CM | POA: Diagnosis not present

## 2018-12-25 DIAGNOSIS — M6740 Ganglion, unspecified site: Secondary | ICD-10-CM | POA: Diagnosis not present

## 2018-12-25 DIAGNOSIS — M19041 Primary osteoarthritis, right hand: Secondary | ICD-10-CM | POA: Diagnosis not present

## 2019-01-13 DIAGNOSIS — M1711 Unilateral primary osteoarthritis, right knee: Secondary | ICD-10-CM | POA: Diagnosis not present

## 2019-01-13 DIAGNOSIS — M1712 Unilateral primary osteoarthritis, left knee: Secondary | ICD-10-CM | POA: Diagnosis not present

## 2019-04-27 DIAGNOSIS — K219 Gastro-esophageal reflux disease without esophagitis: Secondary | ICD-10-CM | POA: Diagnosis not present

## 2019-04-27 DIAGNOSIS — E7800 Pure hypercholesterolemia, unspecified: Secondary | ICD-10-CM | POA: Diagnosis not present

## 2019-04-27 DIAGNOSIS — M797 Fibromyalgia: Secondary | ICD-10-CM | POA: Diagnosis not present

## 2019-04-27 DIAGNOSIS — F419 Anxiety disorder, unspecified: Secondary | ICD-10-CM | POA: Diagnosis not present

## 2019-08-04 DIAGNOSIS — C50211 Malignant neoplasm of upper-inner quadrant of right female breast: Secondary | ICD-10-CM | POA: Diagnosis not present

## 2019-08-04 DIAGNOSIS — K219 Gastro-esophageal reflux disease without esophagitis: Secondary | ICD-10-CM | POA: Diagnosis not present

## 2019-08-04 DIAGNOSIS — E7800 Pure hypercholesterolemia, unspecified: Secondary | ICD-10-CM | POA: Diagnosis not present

## 2019-08-04 DIAGNOSIS — M797 Fibromyalgia: Secondary | ICD-10-CM | POA: Diagnosis not present

## 2019-08-31 NOTE — Telephone Encounter (Signed)
Done

## 2019-09-02 ENCOUNTER — Other Ambulatory Visit: Payer: Self-pay | Admitting: Internal Medicine

## 2019-09-02 DIAGNOSIS — Z1231 Encounter for screening mammogram for malignant neoplasm of breast: Secondary | ICD-10-CM

## 2019-09-22 ENCOUNTER — Other Ambulatory Visit: Payer: Self-pay

## 2019-09-22 ENCOUNTER — Ambulatory Visit
Admission: RE | Admit: 2019-09-22 | Discharge: 2019-09-22 | Disposition: A | Discharge status: Home / Self Care | Payer: BC Managed Care – PPO | Source: Ambulatory Visit | Attending: Internal Medicine | Admitting: Internal Medicine

## 2019-09-22 DIAGNOSIS — Z1231 Encounter for screening mammogram for malignant neoplasm of breast: Secondary | ICD-10-CM

## 2019-10-12 ENCOUNTER — Other Ambulatory Visit: Payer: Self-pay

## 2019-10-12 DIAGNOSIS — Z20822 Contact with and (suspected) exposure to covid-19: Secondary | ICD-10-CM

## 2019-10-14 LAB — NOVEL CORONAVIRUS, NAA: SARS-CoV-2, NAA: NOT DETECTED

## 2019-10-20 DIAGNOSIS — L814 Other melanin hyperpigmentation: Secondary | ICD-10-CM | POA: Diagnosis not present

## 2019-10-20 DIAGNOSIS — D225 Melanocytic nevi of trunk: Secondary | ICD-10-CM | POA: Diagnosis not present

## 2019-10-20 DIAGNOSIS — L821 Other seborrheic keratosis: Secondary | ICD-10-CM | POA: Diagnosis not present

## 2019-11-18 IMAGING — US US EXTREM UP*R* COMP
1 series · 12 of 12 positions shown · non-contrast
Comparison: None.

CLINICAL DATA: Lump along the radial aspect of the right thumb IP
joint after trauma 6 weeks ago.

EXAM:
ULTRASOUND RIGHT UPPER EXTREMITY COMPLETE
TECHNIQUE: Ultrasound examination was performed including evaluation of the
muscles, tendons, joint, and adjacent soft tissues.

[Series 1: us extrem up*right* comp · 0.03mm/px · 12 acquisitions, 12 frames shown]
[im 1/12]
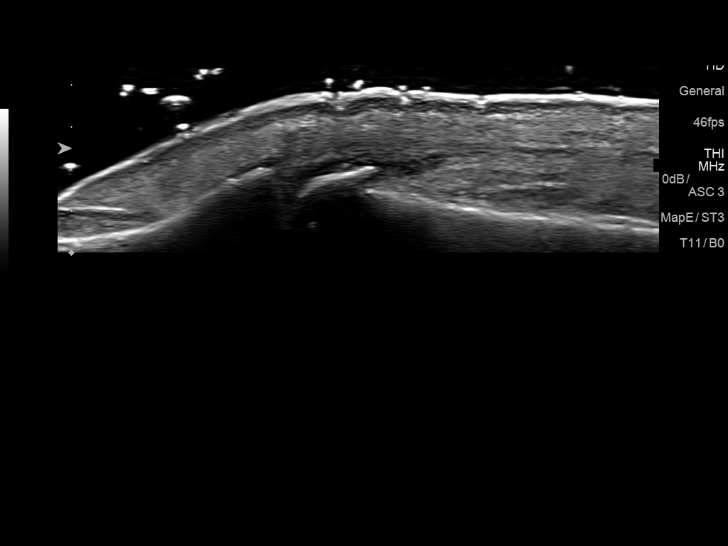
[im 2/12]
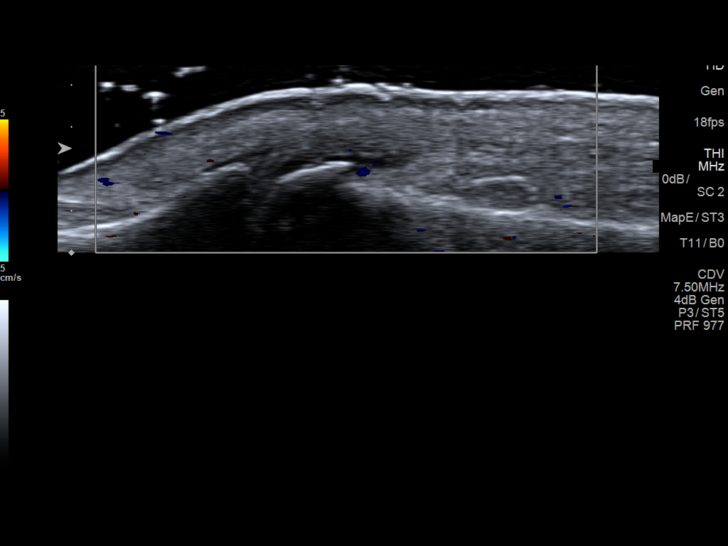
[im 3/12]
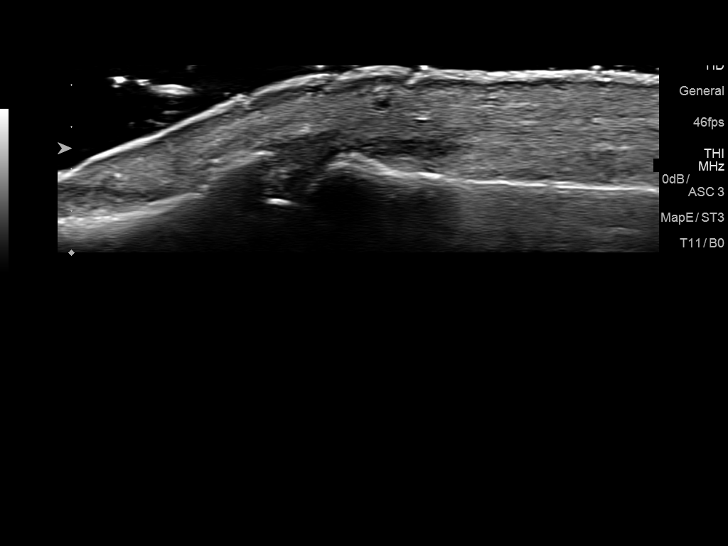
[im 4/12]
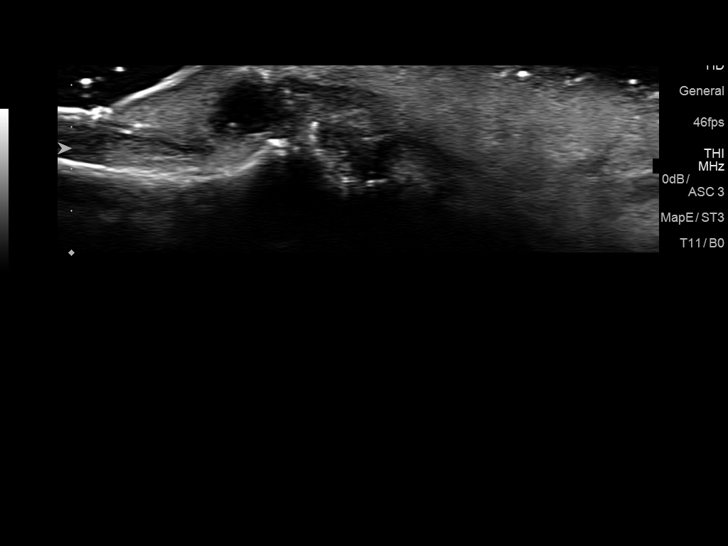
[im 5/12]
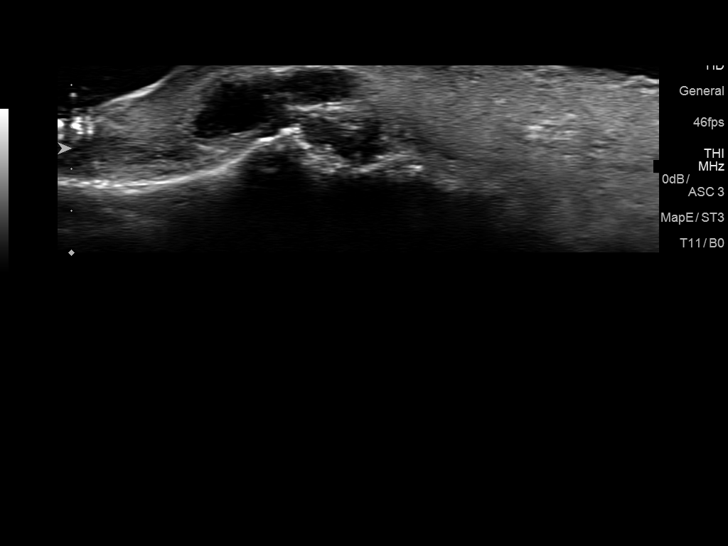
[im 6/12]
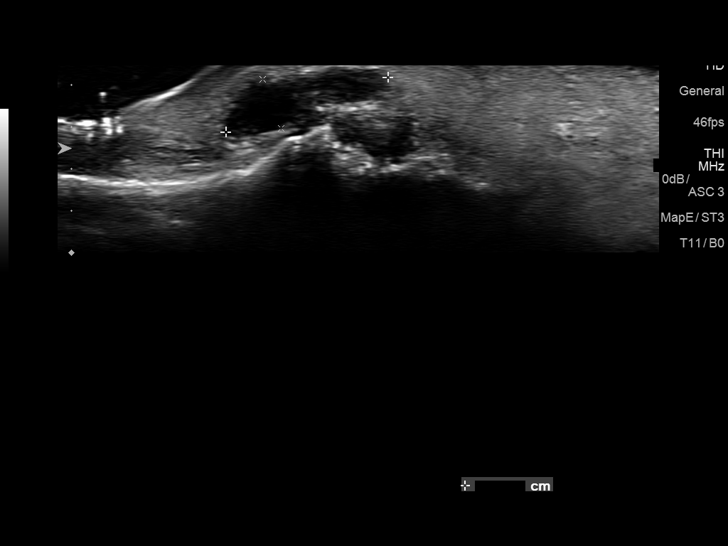
[im 7/12]
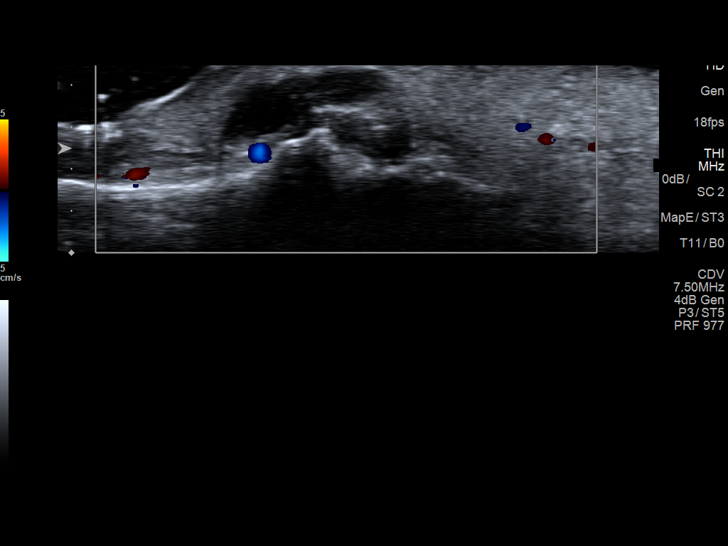
[im 8/12]
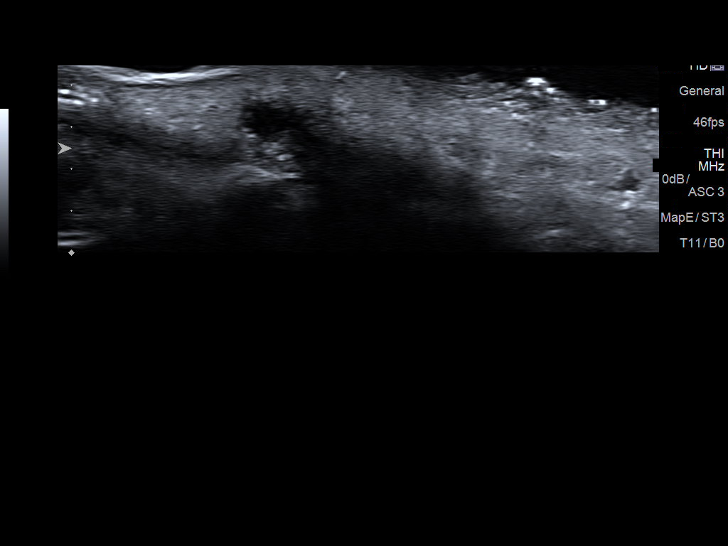
[im 9/12]
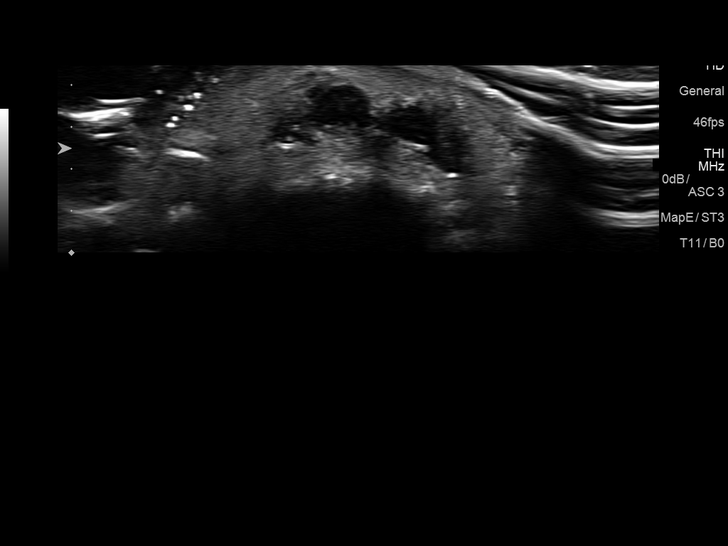
[im 10/12]
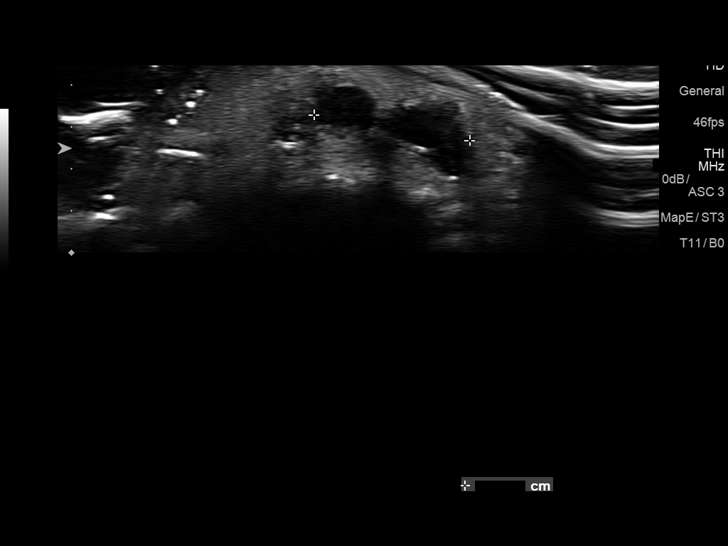
[im 11/12]
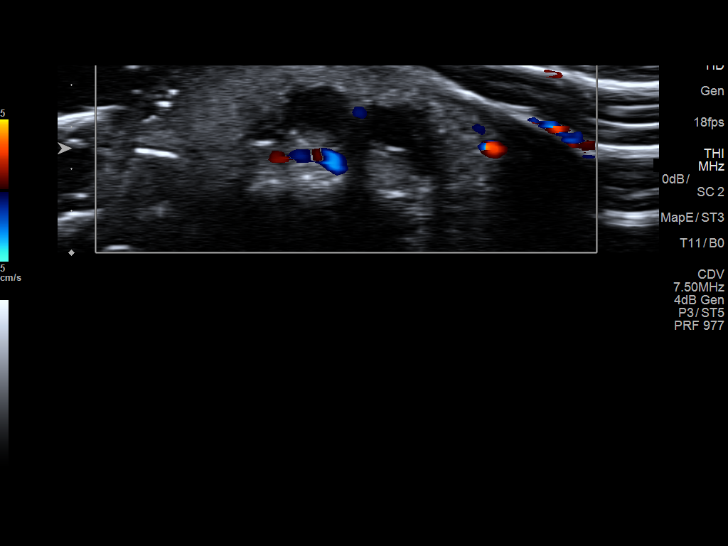
[im 12/12]
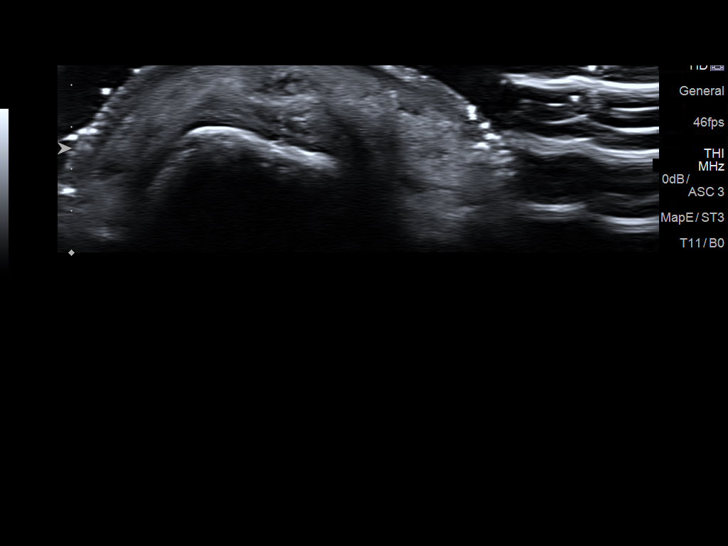

[12 of 12 positions shown; findings below may reference images not displayed]

FINDINGS: Joint Space: Small first IP joint effusion. Prominent osteophyte
along the dorsal first proximal phalanx head.

Muscles: Normal.

Tendons: Normal

Other Soft Tissue Structures: Along the dorsal and radial aspect of
the thumb IP joint, there is a 0.8 x 0.3 x 0.8 cm loculated, mildly
complex cystic lesion that communicates with the IP joint. There is
no internal vascularity.
IMPRESSION: 1. First IP joint osteoarthritis with adjacent 8 mm loculated,
mildly complex cystic lesion communicating with the joint,
consistent with ganglion cyst.

## 2020-02-18 DIAGNOSIS — Z23 Encounter for immunization: Secondary | ICD-10-CM | POA: Diagnosis not present

## 2020-03-16 ENCOUNTER — Ambulatory Visit: Payer: BC Managed Care – PPO

## 2020-03-22 DIAGNOSIS — Z23 Encounter for immunization: Secondary | ICD-10-CM | POA: Diagnosis not present

## 2020-04-11 DIAGNOSIS — Z0000 Encounter for general adult medical examination without abnormal findings: Secondary | ICD-10-CM | POA: Diagnosis not present

## 2020-04-11 DIAGNOSIS — E7800 Pure hypercholesterolemia, unspecified: Secondary | ICD-10-CM | POA: Diagnosis not present

## 2020-07-06 ENCOUNTER — Ambulatory Visit (HOSPITAL_COMMUNITY)
Admission: RE | Admit: 2020-07-06 | Discharge: 2020-07-06 | Discharge status: Against Medical Advice | Payer: BC Managed Care – PPO | Source: Ambulatory Visit | Attending: Internal Medicine | Admitting: Internal Medicine

## 2020-07-06 ENCOUNTER — Other Ambulatory Visit: Payer: Self-pay

## 2020-07-06 DIAGNOSIS — Z20822 Contact with and (suspected) exposure to covid-19: Secondary | ICD-10-CM | POA: Diagnosis not present

## 2020-07-06 NOTE — ED Notes (Signed)
Pt told N. Dukes, pt access rep, that pt "didn't feel comfortable being here b/c of all the sick people." Pt left ambulatory/stable.

## 2020-09-08 ENCOUNTER — Other Ambulatory Visit: Payer: Self-pay | Admitting: Internal Medicine

## 2020-09-08 DIAGNOSIS — Z1231 Encounter for screening mammogram for malignant neoplasm of breast: Secondary | ICD-10-CM

## 2020-09-27 ENCOUNTER — Other Ambulatory Visit: Payer: BC Managed Care – PPO

## 2020-09-27 DIAGNOSIS — Z20822 Contact with and (suspected) exposure to covid-19: Secondary | ICD-10-CM

## 2020-09-29 ENCOUNTER — Other Ambulatory Visit: Payer: BC Managed Care – PPO

## 2020-09-29 LAB — SARS-COV-2, NAA 2 DAY TAT

## 2020-09-29 LAB — NOVEL CORONAVIRUS, NAA: SARS-CoV-2, NAA: NOT DETECTED

## 2020-10-03 ENCOUNTER — Ambulatory Visit: Payer: BC Managed Care – PPO

## 2020-11-02 ENCOUNTER — Ambulatory Visit: Payer: BC Managed Care – PPO

## 2020-11-02 IMAGING — MG DIGITAL SCREENING BILAT W/ TOMO W/ CAD
8 series · 8 of 24 positions shown · non-contrast
Comparison: Previous exam(s).

CLINICAL DATA: Screening.

EXAM:
DIGITAL SCREENING BILATERAL MAMMOGRAM WITH TOMO AND CAD

[R CC synth-2D]
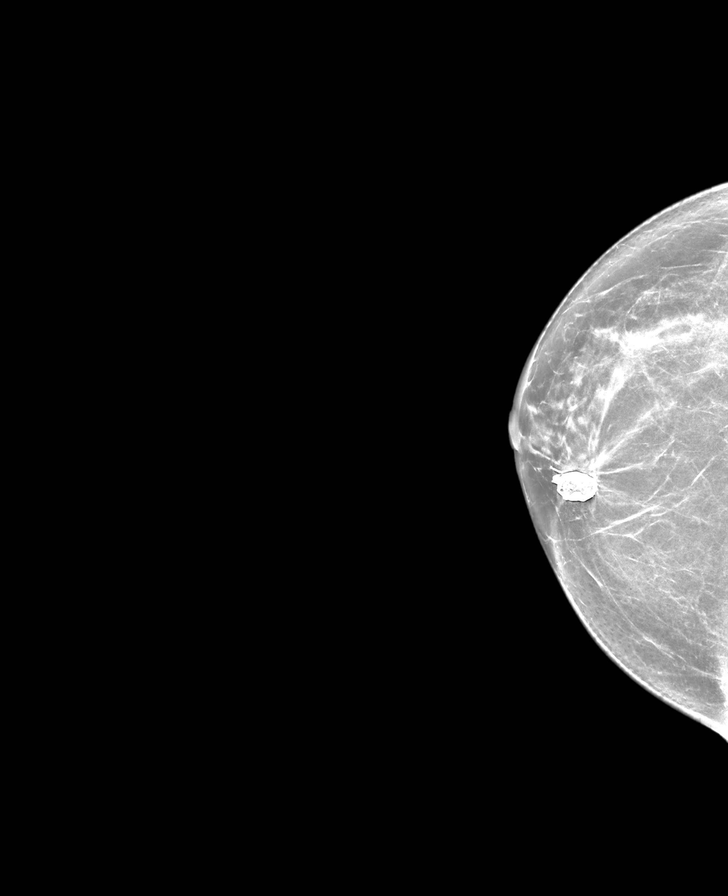

[R MLO synth-2D]
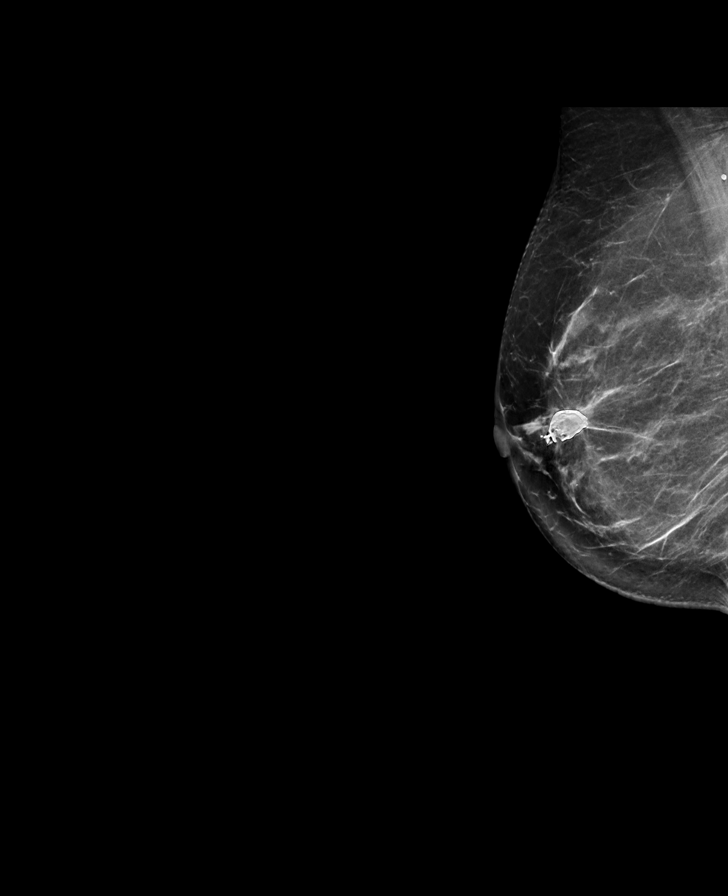

[L CC synth-2D]
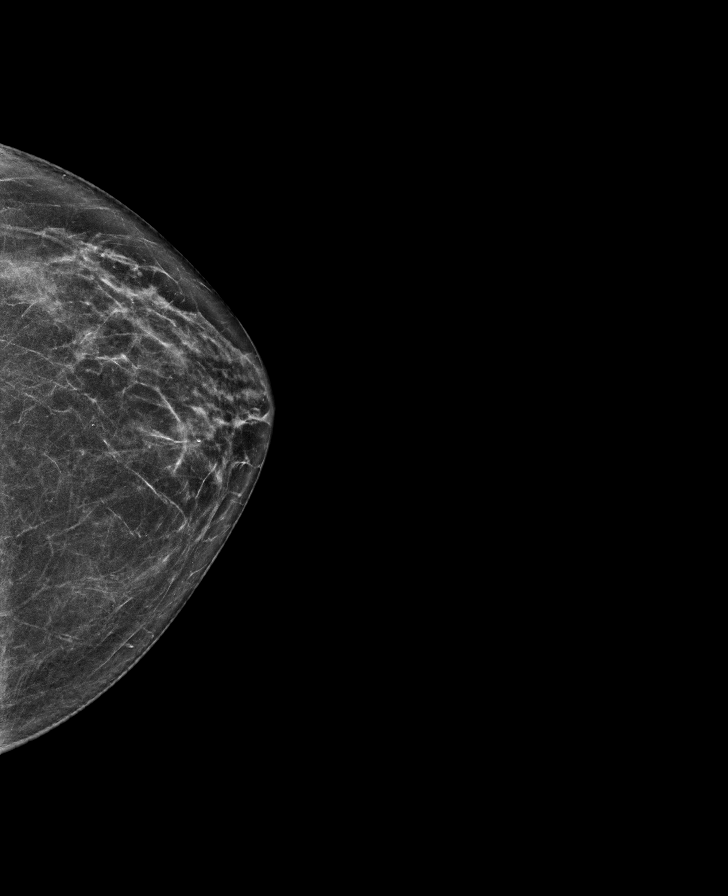

[L MLO synth-2D]
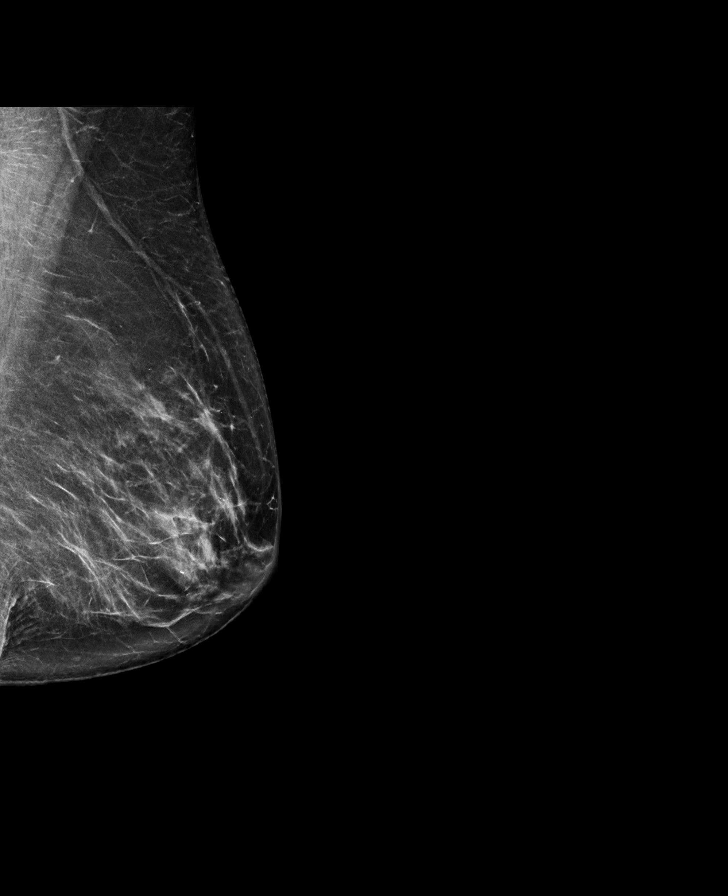

[L CC tomo · tomo slice 33/66.0]
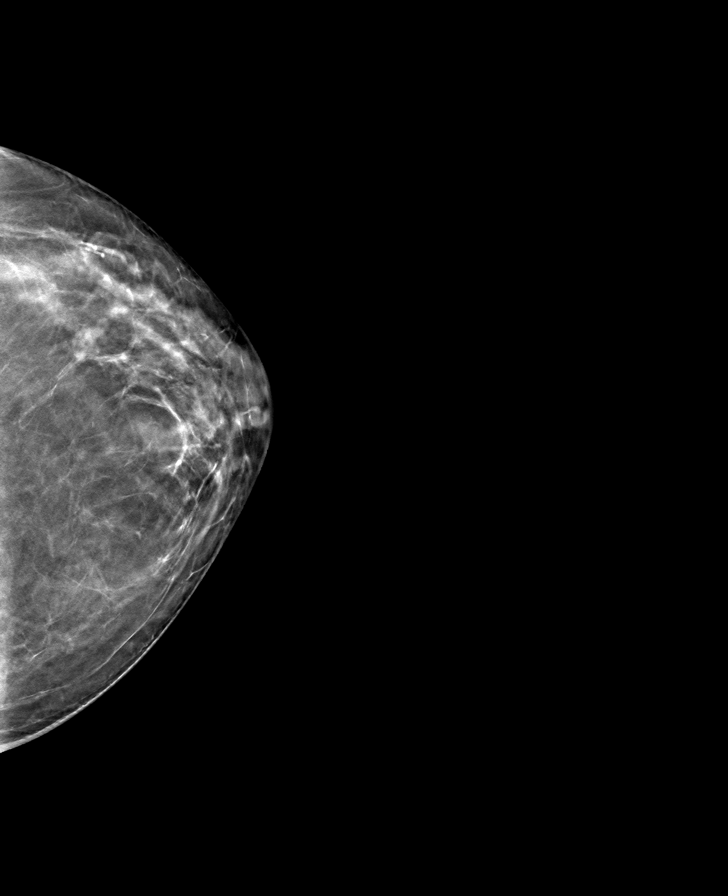

[R CC tomo · tomo slice 35/70.0]
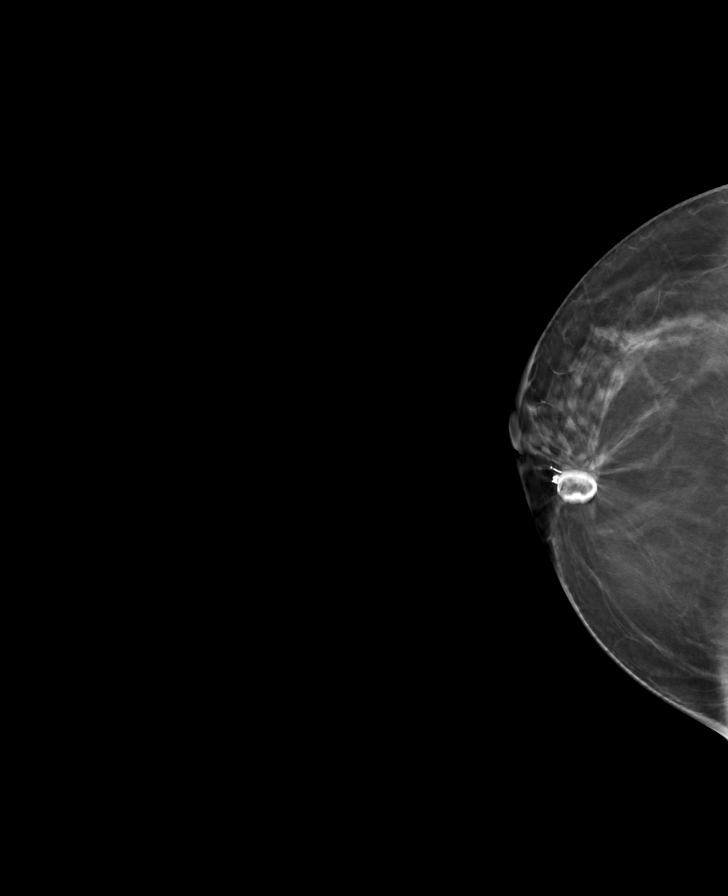

[R MLO tomo · tomo slice 37/74.0]
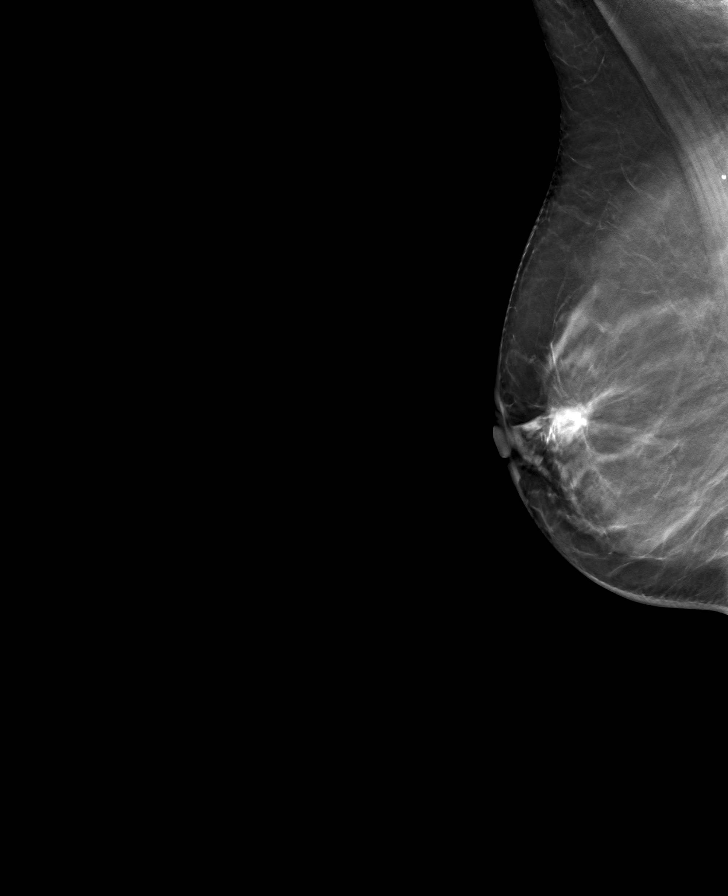

[L MLO tomo · tomo slice 37/74.0]
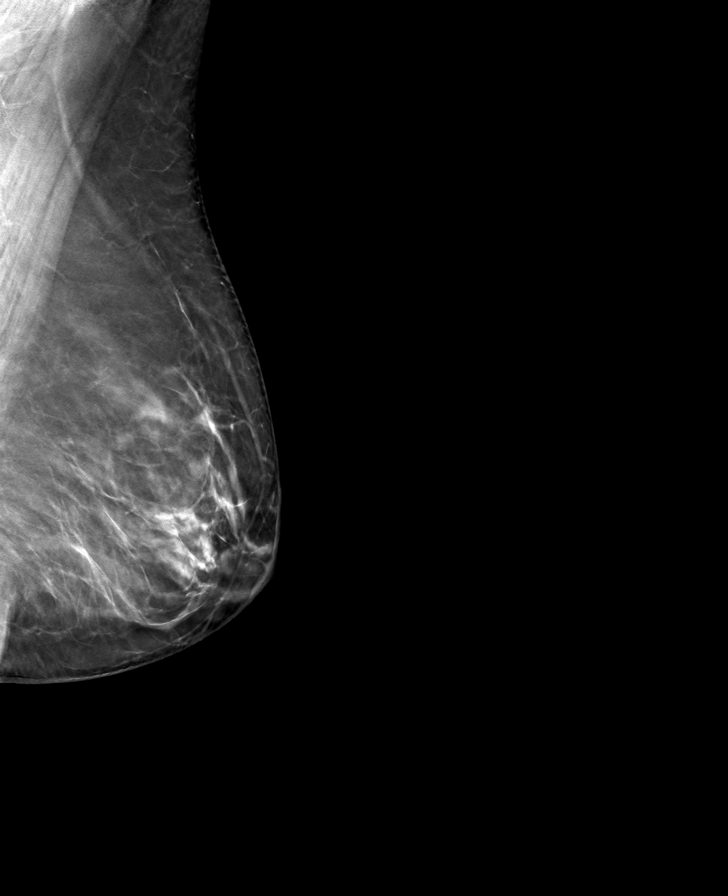

[8 of 24 positions shown; findings below may reference images not displayed]

ACR Breast Density Category b: There are scattered areas of
fibroglandular density.
FINDINGS: There are no findings suspicious for malignancy. Images were
processed with CAD.
IMPRESSION: No mammographic evidence of malignancy. A result letter of this
screening mammogram will be mailed directly to the patient.

RECOMMENDATION:
Screening mammogram in one year. (Code:[id number])

BI-RADS CATEGORY  1: Negative.

## 2020-12-07 ENCOUNTER — Ambulatory Visit
Admission: RE | Admit: 2020-12-07 | Discharge: 2020-12-07 | Disposition: A | Discharge status: Home / Self Care | Payer: Medicare Other | Source: Ambulatory Visit | Attending: Internal Medicine | Admitting: Internal Medicine

## 2020-12-07 ENCOUNTER — Other Ambulatory Visit: Payer: Self-pay

## 2020-12-07 DIAGNOSIS — Z1231 Encounter for screening mammogram for malignant neoplasm of breast: Secondary | ICD-10-CM

## 2021-03-19 ENCOUNTER — Ambulatory Visit (INDEPENDENT_AMBULATORY_CARE_PROVIDER_SITE_OTHER): Payer: Medicare Other | Admitting: Family Medicine

## 2021-03-19 ENCOUNTER — Encounter: Payer: Self-pay | Admitting: Family Medicine

## 2021-03-19 VITALS — BP 140/68 | HR 72 | Resp 16 | Ht 65.0 in | Wt 172.2 lb

## 2021-03-19 DIAGNOSIS — M797 Fibromyalgia: Secondary | ICD-10-CM | POA: Diagnosis not present

## 2021-03-19 DIAGNOSIS — G4700 Insomnia, unspecified: Secondary | ICD-10-CM | POA: Diagnosis not present

## 2021-03-19 DIAGNOSIS — D649 Anemia, unspecified: Secondary | ICD-10-CM

## 2021-03-19 DIAGNOSIS — Z79899 Other long term (current) drug therapy: Secondary | ICD-10-CM

## 2021-03-19 DIAGNOSIS — E785 Hyperlipidemia, unspecified: Secondary | ICD-10-CM

## 2021-03-19 MED ORDER — TRAZODONE HCL 50 MG PO TABS
25.0000 mg | ORAL_TABLET | Freq: Every evening | ORAL | 2 refills | Status: DC | PRN
Start: 2021-03-19 — End: 2021-09-10

## 2021-03-19 MED ORDER — TRAMADOL HCL 50 MG PO TABS: 50.0000 mg | ORAL_TABLET | Freq: Three times a day (TID) | ORAL | 0 refills | Status: AC | PRN

## 2021-03-19 NOTE — Progress Notes (Signed)
Subjective:    Patient ID: Savannah [name], female    DOB: January 12, 1956, 65 y.o.   MRN: [id number]  HPI Chief Complaint  Patient presents with  . Establish Care    New patient to establish care. Due for Welcome to Medicare next month-will schedule. Has fibromylagia, takes flexeril nightly for this. Last doctor would not rx so she has been taking 1/4 of flexeril and 1/4 of .25mg  xanax-would like to discuss with you. 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 Also has been on a new statin and would like to have lipids checked at some point ie:AWV.    She is new to the practice and here to establish care.  Previous medical care: [facility] for the past 5 years   Other providers: Dr. Latanya Maudlin - orthopedist  Dr. Renda Rolls- dermatologist  Dr. Hilarie Fredrickson - GI    Gluten intolerant - avoids gluten and no longer needs acid reflux    States she has fibromyalgia and has been taking Tramadol recently which helps with her pain. She would like a prescription to take as needed only.  States she does not tolerate NSAIDs even topical Voltaren, causes GI upset. Tylenol does not help her pain.   States she has been taking Flexeril every night for the past 30 years. States her previous PCP refused to keep her on the 10 mg dose and was attempting to wean her off the medication. States she has been taking Flexeril to prevent sleep issues and does not necessarily have issues with sleep as long as she takes it.  States she has been taking only a 1/4 of a 5 mg tablet for several months along with a 1/2 alprazolam tablet. This has been working. She does not want to stop the Flexeril for sleep unless there can be another alternative.   HL - taking atorvastatin daily and has not had labs done for at least 6 months.   History of anemia per EMR.   Denies fever, chills, dizziness, chest pain, palpitations, shortness of breath, abdominal pain, N/V/D, urinary symptoms, LE edema.     Social history: Lives alone, works as a Clinical cytogeneticist  Denies smoking or drug use. Alcohol 1-2 times per week    Reviewed allergies, medications, past medical, surgical, family, and social history.    Review of Systems Pertinent positives and negatives in the history of present illness.     Objective:   Physical Exam BP 140/68   Pulse 72   Resp 16   Ht 5\' 5"  (1.651 m)   Wt 172 lb 3.2 oz (78.1 kg)   BMI 28.66 kg/m   Alert and in no distress.  Cardiac exam shows a regular sinus rhythm without murmurs or gallops. Lungs are clear to auscultation.      Assessment & Plan:  Hyperlipidemia, unspecified hyperlipidemia type - Plan: Lipid panel, Comprehensive metabolic panel  On statin therapy - Plan: Lipid panel  Insomnia, unspecified type - Plan: traZODone (DESYREL) 50 MG tablet, CBC with Differential/Platelet, Comprehensive metabolic panel  Fibromyalgia - Plan: traMADol (ULTRAM) 50 MG tablet  Anemia, unspecified type - Plan: CBC with Differential/Platelet, Comprehensive metabolic panel  She is new to the practice and here to establish care.  Continue statin and will check fasting lipids.  She is amenable stoping taking Flexeril if an alternative is available for sleep. Trazodone prescribed and she will try 1/2 tablet initially to see how she does.  I will also prescribe #15  Tramadol to use sparingly for pain related to fibromyalgia and I explained that I am unable to treat chronic pain. PDMP reviewed.  Check labs and follow up. She will come tomorrow for fasting labs.  Follow up in 4 weeks for insomnia and pain.

## 2021-03-20 ENCOUNTER — Other Ambulatory Visit: Payer: Self-pay

## 2021-03-20 ENCOUNTER — Other Ambulatory Visit: Payer: Medicare Other

## 2021-03-20 DIAGNOSIS — E785 Hyperlipidemia, unspecified: Secondary | ICD-10-CM

## 2021-03-20 DIAGNOSIS — Z79899 Other long term (current) drug therapy: Secondary | ICD-10-CM

## 2021-03-20 DIAGNOSIS — G4700 Insomnia, unspecified: Secondary | ICD-10-CM

## 2021-03-20 DIAGNOSIS — D649 Anemia, unspecified: Secondary | ICD-10-CM

## 2021-03-21 LAB — COMPREHENSIVE METABOLIC PANEL
ALT: 14 IU/L (ref 0–32)
AST: 21 IU/L (ref 0–40)
Albumin/Globulin Ratio: 2.5 — ABNORMAL HIGH (ref 1.2–2.2)
Albumin: 4.8 g/dL (ref 3.8–4.8)
Alkaline Phosphatase: 68 IU/L (ref 44–121)
BUN/Creatinine Ratio: 14 (ref 12–28)
BUN: 10 mg/dL (ref 8–27)
Bilirubin Total: 0.4 mg/dL (ref 0.0–1.2)
CO2: 23 mmol/L (ref 20–29)
Calcium: 9.5 mg/dL (ref 8.7–10.3)
Chloride: 103 mmol/L (ref 96–106)
Creatinine, Ser: 0.73 mg/dL (ref 0.57–1.00)
Globulin, Total: 1.9 g/dL (ref 1.5–4.5)
Glucose: 86 mg/dL (ref 65–99)
Potassium: 4.5 mmol/L (ref 3.5–5.2)
Sodium: 142 mmol/L (ref 134–144)
Total Protein: 6.7 g/dL (ref 6.0–8.5)
eGFR: 91 mL/min/{1.73_m2} (ref >59)

## 2021-03-21 LAB — CBC WITH DIFFERENTIAL/PLATELET
Basophils Absolute: 0.1 10*3/uL (ref 0.0–0.2)
Basos: 2 %
EOS (ABSOLUTE): 0.1 10*3/uL (ref 0.0–0.4)
Eos: 4 %
Hematocrit: 43 % (ref 34.0–46.6)
Hemoglobin: 13.9 g/dL (ref 11.1–15.9)
Immature Grans (Abs): 0 10*3/uL (ref 0.0–0.1)
Immature Granulocytes: 0 %
Lymphocytes Absolute: 1.4 10*3/uL (ref 0.7–3.1)
Lymphs: 36 %
MCH: 27.9 pg (ref 26.6–33.0)
MCHC: 32.3 g/dL (ref 31.5–35.7)
MCV: 86 fL (ref 79–97)
Monocytes Absolute: 0.4 10*3/uL (ref 0.1–0.9)
Monocytes: 10 %
Neutrophils Absolute: 1.8 10*3/uL (ref 1.4–7.0)
Neutrophils: 48 %
Platelets: 305 10*3/uL (ref 150–450)
RBC: 4.99 x10E6/uL (ref 3.77–5.28)
RDW: 12.7 % (ref 11.7–15.4)
WBC: 3.8 10*3/uL (ref 3.4–10.8)

## 2021-03-21 LAB — LIPID PANEL
Chol/HDL Ratio: 2.7 ratio (ref 0.0–4.4)
Cholesterol, Total: 195 mg/dL (ref 100–199)
HDL: 73 mg/dL (ref >39)
LDL Chol Calc (NIH): 110 mg/dL — ABNORMAL HIGH (ref 0–99)
Triglycerides: 63 mg/dL (ref 0–149)
VLDL Cholesterol Cal: 12 mg/dL (ref 5–40)

## 2021-04-20 ENCOUNTER — Telehealth: Payer: Self-pay | Admitting: Family Medicine

## 2021-04-20 NOTE — Telephone Encounter (Signed)
Received requested records from [place] at [facility]

## 2021-04-24 ENCOUNTER — Encounter: Payer: Self-pay | Admitting: Internal Medicine

## 2021-04-25 ENCOUNTER — Encounter: Payer: Self-pay | Admitting: Family Medicine

## 2021-04-25 ENCOUNTER — Other Ambulatory Visit: Payer: Self-pay

## 2021-04-25 ENCOUNTER — Ambulatory Visit (INDEPENDENT_AMBULATORY_CARE_PROVIDER_SITE_OTHER): Payer: Medicare Other | Admitting: Family Medicine

## 2021-04-25 VITALS — BP 122/72 | HR 83 | Wt 173.4 lb

## 2021-04-25 DIAGNOSIS — M797 Fibromyalgia: Secondary | ICD-10-CM | POA: Diagnosis not present

## 2021-04-25 DIAGNOSIS — M79622 Pain in left upper arm: Secondary | ICD-10-CM | POA: Diagnosis not present

## 2021-04-25 DIAGNOSIS — G4700 Insomnia, unspecified: Secondary | ICD-10-CM

## 2021-04-25 NOTE — Progress Notes (Signed)
   Subjective:    Patient ID: Savannah [name], female    DOB: [birth year], 65 y.o.   MRN: [id number]  HPI Chief Complaint  Patient presents with  . follow-up    4 week follow-up on insomnia and pain. Couple nights a week having insomnia and always having pain all over   She is here for a 4 week follow up on insomnia and chronic pain.  States she is sleeping well 5 out of 7 nights without any medication.   She stopped taking alprazolam and Flexeril at bedtime but has used it sparingly on a couple of occasions. She has not yet tried Trazodone.   States she has taken 5 Tramadol in the past month for chronic pain. Doing well otherwise.   Complains of intermittent left upper arm pain. Occurs 4-5 times per year. 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 Pain described as sharp, stabbing or burning and lasts 2-3 weeks.  uses heat.   Hx of fractured left elbow and left shoulder issues.   States her pulse on fit bit is slightly elevated at times. Does not appear to be associated with arm pain Resting heart rate 60-65 per patient.  Denies chest pain, palpitations, shortness of breath, orthopnea, abdominal pain, N/V/D.   Father at 86 died of a heart attack.    Review of Systems Pertinent positives and negatives in the history of present illness.     Objective:   Physical Exam BP 122/72   Pulse 83   Wt 173 lb 6.4 oz (78.7 kg)   BMI 28.86 kg/m   Alert and in no distress. Cardiac exam shows a regular sinus rhythm without murmurs or gallops. Lungs are clear to auscultation. Left upper arm with TTP over bicep insertion and pain with supination of her left arm. Bilateral upper extremities without normal sensation, ROM and strength.       Assessment & Plan:  Insomnia, unspecified type  Fibromyalgia  Left upper arm pain  Appears to be doing well with sleep and uses Tramadol sparingly.  Demonstrated how to check pulse and  encouraged her to check it when her fit bit says her pulse is elevated.  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 Counseling on conservative treatment.  Discussed risk factors for heart disease and her father having a MI at age 51 is a risk factor. Low risk.  Follow up when due for CPE/AWV or sooner if needed.

## 2021-05-28 ENCOUNTER — Encounter: Payer: Self-pay | Admitting: Internal Medicine

## 2021-05-31 ENCOUNTER — Ambulatory Visit: Payer: Medicare Other | Admitting: Family Medicine

## 2021-05-31 ENCOUNTER — Encounter: Payer: Self-pay | Admitting: Internal Medicine

## 2021-06-01 ENCOUNTER — Other Ambulatory Visit: Payer: Self-pay | Admitting: Radiology

## 2021-06-01 DIAGNOSIS — N6310 Unspecified lump in the right breast, unspecified quadrant: Secondary | ICD-10-CM

## 2021-06-01 DIAGNOSIS — Z853 Personal history of malignant neoplasm of breast: Secondary | ICD-10-CM

## 2021-06-05 ENCOUNTER — Ambulatory Visit
Admission: RE | Admit: 2021-06-05 | Discharge: 2021-06-05 | Disposition: A | Discharge status: Home / Self Care | Payer: Medicare Other | Source: Ambulatory Visit | Attending: Radiology | Admitting: Radiology

## 2021-06-05 ENCOUNTER — Other Ambulatory Visit: Payer: Self-pay

## 2021-06-05 DIAGNOSIS — Z853 Personal history of malignant neoplasm of breast: Secondary | ICD-10-CM

## 2021-06-05 DIAGNOSIS — N6310 Unspecified lump in the right breast, unspecified quadrant: Secondary | ICD-10-CM

## 2021-06-28 ENCOUNTER — Telehealth: Payer: Self-pay | Admitting: Family Medicine

## 2021-06-28 DIAGNOSIS — E785 Hyperlipidemia, unspecified: Secondary | ICD-10-CM

## 2021-06-28 MED ORDER — ATORVASTATIN CALCIUM 10 MG PO TABS: 10.0000 mg | ORAL_TABLET | Freq: Every day | ORAL | 2 refills | Status: DC

## 2021-06-28 NOTE — Telephone Encounter (Signed)
Pt is requesting a refill on Atorvastatin sent to [facility] mail order

## 2021-07-02 ENCOUNTER — Other Ambulatory Visit: Payer: Self-pay | Admitting: Internal Medicine

## 2021-07-02 DIAGNOSIS — E785 Hyperlipidemia, unspecified: Secondary | ICD-10-CM

## 2021-07-02 MED ORDER — ATORVASTATIN CALCIUM 10 MG PO TABS: 10.0000 mg | ORAL_TABLET | Freq: Every day | ORAL | 1 refills | Status: DC

## 2021-09-10 ENCOUNTER — Encounter: Payer: Self-pay | Admitting: Family Medicine

## 2021-09-10 ENCOUNTER — Other Ambulatory Visit: Payer: Self-pay

## 2021-09-10 ENCOUNTER — Ambulatory Visit (INDEPENDENT_AMBULATORY_CARE_PROVIDER_SITE_OTHER): Payer: Medicare Other | Admitting: Family Medicine

## 2021-09-10 VITALS — BP 142/88 | HR 72 | Ht 65.0 in | Wt 170.0 lb

## 2021-09-10 DIAGNOSIS — Z1329 Encounter for screening for other suspected endocrine disorder: Secondary | ICD-10-CM

## 2021-09-10 DIAGNOSIS — Z23 Encounter for immunization: Secondary | ICD-10-CM

## 2021-09-10 DIAGNOSIS — Z1159 Encounter for screening for other viral diseases: Secondary | ICD-10-CM

## 2021-09-10 DIAGNOSIS — M25571 Pain in right ankle and joints of right foot: Secondary | ICD-10-CM | POA: Diagnosis not present

## 2021-09-10 DIAGNOSIS — Z0000 Encounter for general adult medical examination without abnormal findings: Secondary | ICD-10-CM

## 2021-09-10 DIAGNOSIS — Z853 Personal history of malignant neoplasm of breast: Secondary | ICD-10-CM

## 2021-09-10 DIAGNOSIS — M25572 Pain in left ankle and joints of left foot: Secondary | ICD-10-CM

## 2021-09-10 DIAGNOSIS — M25552 Pain in left hip: Secondary | ICD-10-CM

## 2021-09-10 DIAGNOSIS — E785 Hyperlipidemia, unspecified: Secondary | ICD-10-CM

## 2021-09-10 DIAGNOSIS — Z90710 Acquired absence of both cervix and uterus: Secondary | ICD-10-CM

## 2021-09-10 DIAGNOSIS — Z136 Encounter for screening for cardiovascular disorders: Secondary | ICD-10-CM

## 2021-09-10 DIAGNOSIS — M25512 Pain in left shoulder: Secondary | ICD-10-CM

## 2021-09-10 DIAGNOSIS — M25542 Pain in joints of left hand: Secondary | ICD-10-CM

## 2021-09-10 DIAGNOSIS — M797 Fibromyalgia: Secondary | ICD-10-CM

## 2021-09-10 DIAGNOSIS — M25541 Pain in joints of right hand: Secondary | ICD-10-CM | POA: Diagnosis not present

## 2021-09-10 DIAGNOSIS — R319 Hematuria, unspecified: Secondary | ICD-10-CM

## 2021-09-10 DIAGNOSIS — M25551 Pain in right hip: Secondary | ICD-10-CM

## 2021-09-10 LAB — POCT URINALYSIS DIP (PROADVANTAGE DEVICE)
Bilirubin, UA: NEGATIVE
Glucose, UA: NEGATIVE mg/dL
Ketones, POC UA: NEGATIVE mg/dL
Leukocytes, UA: NEGATIVE
Nitrite, UA: NEGATIVE
Protein Ur, POC: NEGATIVE mg/dL
Specific Gravity, Urine: 1.025
Urobilinogen, Ur: 0.2
pH, UA: 5.5 (ref 5.0–8.0)

## 2021-09-10 MED ORDER — MELOXICAM 7.5 MG PO TABS: 7.5000 mg | ORAL_TABLET | Freq: Every day | ORAL | 1 refills | Status: DC

## 2021-09-10 MED ORDER — TRAMADOL HCL 50 MG PO TABS: 50.0000 mg | ORAL_TABLET | Freq: Three times a day (TID) | ORAL | 0 refills | Status: AC | PRN

## 2021-09-10 NOTE — Progress Notes (Signed)
Savannah [name] is a 65 y.o. female who presents for annual wellness visit and follow-up on chronic medical conditions.  She has the following concerns:  Complains of multiple joint pains. For the past month she has been having bilateral thumb pain, bilateral wrist pain, left shoulder pain, upper back pain, left hip pain, bilateral ankle pain. No morning stiffness. No red, hot, swollen joints. Denies focal weakness.  States she was diagnosed with fibromyalgia years ago at [facility].  Dr. Maxwell [name] treated her with flexeril at bedtime for years. She has stopped taking it per my recommendation and now only takes Tramadol prn.    States these new aches and pains are not fibromyalgia pain, this feels different.  Reports having abnormal inflammatory markers in her past but is not sure specifically what was elevated.   NSAIDs make her sick to her stomach, acid reflux.   Tramadol works for her fibromyalgia pain but not as well for her arthralgias.   HL- on statin    Immunization History  Administered Date(s) Administered   Influenza,inj,Quad PF,6+ Mos 09/16/2016, 11/18/2017, 09/07/2019   Moderna Sars-Covid-2 Vaccination 02/18/2020, 03/22/2020, 03/14/2021   Pneumococcal Conjugate-13 09/16/2016   Td 12/18/2006, 11/19/2018   Zoster Recombinat (Shingrix) 05/25/2018, 10/10/2018   Zoster, Live 05/27/2018, 10/11/2018   Last Pap smear: hx of hysterectomy. Pelvic exam earlier this year  Last mammogram: 06/05/21 Last colonoscopy: 06/30/2018, mother with colon cancer  Last DEXA: 04/01/2018 and normal  Dentist: every 6 months  Ophtho: last exam 11/2020 Exercise: walking 3 days a week  Other doctors caring for patient include: [facility] [place]- Dermatology Savannah [name]- Orthopedic Surgeon Savannah [name]- GI   Depression screen:  See questionnaire below.  Depression screen [facility] 2/9 09/10/2021 04/25/2021 03/19/2021  Decreased Interest 0 0 0  Down, Depressed, Hopeless 0 0  0  PHQ - 2 Score 0 0 0    Fall Risk Screen: see questionnaire below. Fall Risk  09/10/2021 04/25/2021 03/19/2021  Falls in the past year? 0 0 0  Number falls in past yr: 0 0 0  Injury with Fall? 0 0 0  Risk for fall due to : History of fall(s) History of fall(s) No Fall Risks  Follow up Falls prevention discussed;Falls evaluation completed Falls evaluation completed Falls evaluation completed    ADL screen:  See questionnaire below Functional Status Survey: Is the patient deaf or have difficulty hearing?: No Does the patient have difficulty seeing, even when wearing glasses/contacts?: No Does the patient have difficulty concentrating, remembering, or making decisions?: No Does the patient have difficulty walking or climbing stairs?: No Does the patient have difficulty dressing or bathing?: No Does the patient have difficulty doing errands alone such as visiting a doctor's office or shopping?: No   End of Life Discussion:  Patient has a living will and medical power of attorney.  Review of Systems Constitutional: -fever, -chills, -sweats, -unexpected weight change, -anorexia, -fatigue Allergy: -sneezing, -itching, -congestion Dermatology: denies changing moles, rash, lumps, new worrisome lesions ENT: -runny nose, -ear pain, -sore throat, -hoarseness, -sinus pain, -teeth pain, -tinnitus, -hearing loss, -epistaxis Cardiology:  -chest pain, -palpitations, -edema, -orthopnea, -paroxysmal nocturnal dyspnea Respiratory: -cough, -shortness of breath, -dyspnea on exertion, -wheezing, -hemoptysis Gastroenterology: -abdominal pain, -nausea, -vomiting, -diarrhea, -constipation, -blood in stool, -changes in bowel movement, -dysphagia Hematology: -bleeding or bruising problems Musculoskeletal: +arthralgias, -myalgias, -joint swelling, -back pain, -neck pain, -cramping, -gait changes Ophthalmology: -vision changes, -eye redness, -itching, -discharge Urology: -dysuria, -difficulty urinating,  -hematuria, -urinary frequency, -urgency, incontinence Neurology: -  headache, -weakness, -tingling, -numbness, -speech abnormality, -memory loss, -falls, -dizziness Psychology:  -depressed mood, -agitation, -sleep problems    PHYSICAL EXAM:  BP (!) 142/88 (BP Location: Right Arm, Patient Position: Sitting)   Pulse 72   Ht 5\' 5"  (1.651 m)   Wt 170 lb (77.1 kg)   SpO2 99%   BMI 28.29 kg/m   General Appearance: Alert, cooperative, no distress, appears stated age Head: Normocephalic, without obvious abnormality, atraumatic Eyes: PERRL, conjunctiva/corneas clear, EOM's intact Ears: Normal TM's and external ear canals Nose: mask on  Throat: mask on  Neck: Supple, no lymphadenopathy; thyroid: no enlargement/tenderness/nodules; no JVD Back: Spine nontender, no curvature, ROM normal, no CVA tenderness Lungs: Clear to auscultation bilaterally without wheezes, rales or ronchi; respirations unlabored Chest Wall: No tenderness or deformity Heart: Regular rate and rhythm, S1 and S2 normal, no murmur, rub or gallop Breast Exam: OB/GYN Abdomen: Soft, non-tender, nondistended, normoactive bowel sounds, no masses, no hepatosplenomegaly Genitalia: OB/GYN Extremities: No clubbing, cyanosis or edema. TTP to bilateral thumb MCP joints.  Pulses: 2+ and symmetric all extremities Skin: Skin color, texture, turgor normal, no rashes or lesions Lymph nodes: Cervical and supraclavicular nodes normal Neurologic: CNII-XII intact, normal strength, sensation and gait Psych: Normal mood, affect, hygiene and grooming.  ASSESSMENT/PLAN: Medicare welcome visit - Plan: CBC with Differential/Platelet, Comprehensive metabolic panel, TSH, POCT Urinalysis DIP (Proadvantage Device) -she denies concerns with ADLs, memory, depression and no falls. Advance directive counseling done. Immunizations discussed.   Hyperlipidemia, unspecified hyperlipidemia type - Plan: Lipid panel -continue statin therapy. Low fat diet and  exercise.   Needs flu shot - Plan: Flu Vaccine QUAD High Dose(Fluad)  History of breast cancer  Hx of hysterectomy  Screening for heart disease - Plan: EKG 12-Lead  Screening for thyroid disorder - Plan: TSH  Need for hepatitis C screening test - Plan: Hepatitis C antibody  Arthralgia of both hands - Plan: CBC with Differential/Platelet, Sedimentation rate, Rheumatoid factor, ANA, C-reactive protein -meloxicam prescribed. Take with food and even Pepcid if needed.  Check labs and consider referral to rheumatology  Arthralgia of both ankles - Plan: CBC with Differential/Platelet, Sedimentation rate, Rheumatoid factor, ANA, C-reactive protein  Acute pain of left shoulder - Plan: Sedimentation rate, Rheumatoid factor, ANA, C-reactive protein  Pain of both hip joints - Plan: CBC with Differential/Platelet, Sedimentation rate, Rheumatoid factor, ANA, C-reactive protein  Fibromyalgia -managing with Tramadol prn  Hematuria, unspecified type - Plan: Urine Microscopic, Urine Culture -asymptomatic. Send for microscopic and culture. Refer to urologist pending results.     Discussed monthly self breast exams and yearly mammograms; at least 30 minutes of aerobic activity at least 5 days/week and weight-bearing exercise 2x/week; proper sunscreen use reviewed; healthy diet, including goals of calcium and vitamin D intake and alcohol recommendations (less than or equal to 1 drink/day) reviewed; regular seatbelt use; changing batteries in smoke detectors.  Immunization recommendations discussed.  Colonoscopy recommendations reviewed   Medicare Attestation I have personally reviewed: The patient's medical and social history Their use of alcohol, tobacco or illicit drugs Their current medications and supplements The patient's functional ability including ADLs,fall risks, home safety risks, cognitive, and hearing and visual impairment Diet and physical activities Evidence for depression or mood  disorders  The patient's weight, height, and BMI have been recorded in the chart.  I have made referrals, counseling, and provided education to the patient based on review of the above and I have provided the patient with a written personalized care plan for preventive services.  Harland Dingwall, NP-C   09/10/2021

## 2021-09-10 NOTE — Patient Instructions (Signed)
  Ms. Festa , Thank you for taking time to come for your Medicare Wellness Visit. I appreciate your ongoing commitment to your health goals. Please review the following plan we discussed and let me know if I can assist you in the future.     This is a list of the screening recommended for you and due dates:  Health Maintenance  Topic Date Due   HIV Screening  Never done   Hepatitis C Screening: USPSTF Recommendation to screen - Ages 94-66 yo.  Never done   Pap Smear  Never done   COVID-19 Vaccine (4 - Booster for Moderna series) 06/06/2021   Flu Shot  07/02/2021   Mammogram  12/07/2022   Colon Cancer Screening  06/30/2028   Tetanus Vaccine  11/19/2028   DEXA scan (bone density measurement)  Completed   Zoster (Shingles) Vaccine  Completed   HPV Vaccine  Aged Out

## 2021-09-11 ENCOUNTER — Other Ambulatory Visit: Payer: Medicare Other

## 2021-09-11 ENCOUNTER — Encounter: Payer: Self-pay | Admitting: Family Medicine

## 2021-09-11 DIAGNOSIS — M25571 Pain in right ankle and joints of right foot: Secondary | ICD-10-CM

## 2021-09-11 DIAGNOSIS — M25552 Pain in left hip: Secondary | ICD-10-CM

## 2021-09-11 DIAGNOSIS — Z1329 Encounter for screening for other suspected endocrine disorder: Secondary | ICD-10-CM

## 2021-09-11 DIAGNOSIS — Z1159 Encounter for screening for other viral diseases: Secondary | ICD-10-CM

## 2021-09-11 DIAGNOSIS — M25541 Pain in joints of right hand: Secondary | ICD-10-CM

## 2021-09-11 DIAGNOSIS — E785 Hyperlipidemia, unspecified: Secondary | ICD-10-CM

## 2021-09-11 DIAGNOSIS — Z0000 Encounter for general adult medical examination without abnormal findings: Secondary | ICD-10-CM

## 2021-09-11 DIAGNOSIS — M25512 Pain in left shoulder: Secondary | ICD-10-CM

## 2021-09-11 DIAGNOSIS — M25551 Pain in right hip: Secondary | ICD-10-CM

## 2021-09-11 LAB — URINALYSIS, MICROSCOPIC ONLY
Bacteria, UA: NONE SEEN
Casts: NONE SEEN /lpf
Epithelial Cells (non renal): NONE SEEN /hpf (ref 0–10)
WBC, UA: NONE SEEN /hpf (ref 0–5)

## 2021-09-12 LAB — COMPREHENSIVE METABOLIC PANEL
ALT: 16 IU/L (ref 0–32)
AST: 20 IU/L (ref 0–40)
Albumin/Globulin Ratio: 2.4 — ABNORMAL HIGH (ref 1.2–2.2)
Albumin: 4.6 g/dL (ref 3.8–4.8)
Alkaline Phosphatase: 59 IU/L (ref 44–121)
BUN/Creatinine Ratio: 19 (ref 12–28)
BUN: 14 mg/dL (ref 8–27)
Bilirubin Total: 0.5 mg/dL (ref 0.0–1.2)
CO2: 22 mmol/L (ref 20–29)
Calcium: 9.4 mg/dL (ref 8.7–10.3)
Chloride: 103 mmol/L (ref 96–106)
Creatinine, Ser: 0.73 mg/dL (ref 0.57–1.00)
Globulin, Total: 1.9 g/dL (ref 1.5–4.5)
Glucose: 92 mg/dL (ref 70–99)
Potassium: 4.3 mmol/L (ref 3.5–5.2)
Sodium: 140 mmol/L (ref 134–144)
Total Protein: 6.5 g/dL (ref 6.0–8.5)
eGFR: 91 mL/min/{1.73_m2} (ref >59)

## 2021-09-12 LAB — CBC WITH DIFFERENTIAL/PLATELET
Basophils Absolute: 0.1 10*3/uL (ref 0.0–0.2)
Basos: 1 %
EOS (ABSOLUTE): 0.1 10*3/uL (ref 0.0–0.4)
Eos: 2 %
Hematocrit: 41.4 % (ref 34.0–46.6)
Hemoglobin: 13.4 g/dL (ref 11.1–15.9)
Immature Grans (Abs): 0 10*3/uL (ref 0.0–0.1)
Immature Granulocytes: 0 %
Lymphocytes Absolute: 0.8 10*3/uL (ref 0.7–3.1)
Lymphs: 17 %
MCH: 28.4 pg (ref 26.6–33.0)
MCHC: 32.4 g/dL (ref 31.5–35.7)
MCV: 88 fL (ref 79–97)
Monocytes Absolute: 0.4 10*3/uL (ref 0.1–0.9)
Monocytes: 8 %
Neutrophils Absolute: 3.2 10*3/uL (ref 1.4–7.0)
Neutrophils: 72 %
Platelets: 279 10*3/uL (ref 150–450)
RBC: 4.72 x10E6/uL (ref 3.77–5.28)
RDW: 12.8 % (ref 11.7–15.4)
WBC: 4.6 10*3/uL (ref 3.4–10.8)

## 2021-09-12 LAB — TSH: TSH: 1.31 u[IU]/mL (ref 0.450–4.500)

## 2021-09-12 LAB — SEDIMENTATION RATE: Sed Rate: 2 mm/hr (ref 0–40)

## 2021-09-12 LAB — C-REACTIVE PROTEIN: CRP: 2 mg/L (ref 0–10)

## 2021-09-12 LAB — LIPID PANEL
Chol/HDL Ratio: 2.4 ratio (ref 0.0–4.4)
Cholesterol, Total: 205 mg/dL — ABNORMAL HIGH (ref 100–199)
HDL: 84 mg/dL (ref >39)
LDL Chol Calc (NIH): 108 mg/dL — ABNORMAL HIGH (ref 0–99)
Triglycerides: 70 mg/dL (ref 0–149)
VLDL Cholesterol Cal: 13 mg/dL (ref 5–40)

## 2021-09-12 LAB — HEPATITIS C ANTIBODY: Hep C Virus Ab: <0.1 s/co ratio (ref 0.0–0.9)

## 2021-09-12 LAB — RHEUMATOID FACTOR: Rheumatoid fact SerPl-aCnc: <10 IU/mL (ref <14.0)

## 2021-09-12 LAB — URINE CULTURE

## 2021-09-12 LAB — ANA: Anti Nuclear Antibody (ANA): NEGATIVE

## 2021-11-16 ENCOUNTER — Telehealth: Payer: Self-pay

## 2021-11-16 MED ORDER — TRAMADOL HCL 50 MG PO TABS
50.0000 mg | ORAL_TABLET | Freq: Three times a day (TID) | ORAL | 0 refills | Status: AC | PRN
Start: 2021-11-16 — End: 2021-11-21

## 2021-11-16 NOTE — Telephone Encounter (Signed)
Called pt to find out if she needed her tramadol refilled. [place]

## 2021-11-16 NOTE — Telephone Encounter (Signed)
Pt called back to advise she is having a arthritic flare up an dwould like a refill on her tramadol that was prescribe 09/10/21 last by vickie. Please advise if you would refill or request an appointment. [place]

## 2022-05-17 ENCOUNTER — Telehealth: Payer: Self-pay

## 2022-05-17 ENCOUNTER — Ambulatory Visit (INDEPENDENT_AMBULATORY_CARE_PROVIDER_SITE_OTHER): Payer: Medicare Other | Admitting: Medical

## 2022-05-17 VITALS — BP 100/62 | HR 72 | Temp 97.8°F | Wt 169.2 lb

## 2022-05-17 DIAGNOSIS — Z7251 High risk heterosexual behavior: Secondary | ICD-10-CM

## 2022-05-17 DIAGNOSIS — L989 Disorder of the skin and subcutaneous tissue, unspecified: Secondary | ICD-10-CM | POA: Diagnosis not present

## 2022-05-17 DIAGNOSIS — R21 Rash and other nonspecific skin eruption: Secondary | ICD-10-CM

## 2022-05-17 DIAGNOSIS — Z113 Encounter for screening for infections with a predominantly sexual mode of transmission: Secondary | ICD-10-CM

## 2022-05-17 NOTE — Progress Notes (Signed)
Subjective:  Kerianna Rawlinson is a 66 y.o. female who presents for Chief Complaint  Patient presents with   rash    Rash on stomach, breast, back, arms and top of legs. Seen dermatology 2 weeks into the rash and thought it was just allergies. This has been going on for 3 weeks     Here for rash x 4 weeks.  Saw dermatology after rash was there about 2 weeks.  Dermatologist wasn't sure, questionable hives.  Over last 2 weeks rash has spread.  Started on torso and has spread to arms, legs, chest.    Not itchy.   No fever, no body aches or chills, no appetite change.  No recent changes in foods, medications, hygiene products.     The week the before rash started was hiking, had used topical tick repellant, but hasn't used any more of this.    Has some loose stool in past week or so.     Traveling internationally in 2 weeks.    No hx/o syphilis or hepatitis. Last sexually activity was prior to 2019.  Found out after the fact that the partner has other partners.  She did not have STD screen after that relationship.  No other aggravating or relieving factors. No other complaint.   Past Medical History:  Diagnosis Date   Adhesive capsulitis    BPPV (benign paroxysmal positional vertigo)    Breast cancer ([place]) 2008   Endometriosis    Fibromyalgia    GERD (gastroesophageal reflux disease)    Hyperlipidemia    Lymphedema    Malaria 1996   Personal history of radiation therapy 2008   PONV (postoperative nausea and vomiting)    Shingles 2002   Current Outpatient Medications on File Prior to Visit  Medication Sig Dispense Refill   atorvastatin (LIPITOR) 10 MG tablet Take 1 tablet (10 mg total) by mouth daily. 90 tablet 1   cetirizine (ZYRTEC) 10 MG chewable tablet Chew 10 mg by mouth daily.     Cholecalciferol (VITAMIN D) 125 MCG (5000 UT) CAPS Take 1 capsule by mouth daily. (Patient not taking: Reported on 05/17/2022)     Coenzyme Q10 100 MG capsule Take 100 mg by mouth daily. (Patient not  taking: Reported on 05/17/2022)     Krill Oil 500 MG CAPS Take 1 capsule by mouth daily. (Patient not taking: Reported on 05/17/2022)     Omega-3 Fatty Acids (OMEGA 3 500) 500 MG CAPS 1 capsule (Patient not taking: Reported on 05/17/2022)     TURMERIC CURCUMIN PO Take 1 tablet by mouth daily. (Patient not taking: Reported on 05/17/2022)     vitamin E 180 MG (400 UNITS) capsule  (Patient not taking: Reported on 05/17/2022)     No current facility-administered medications on file prior to visit.    The following portions of the patient's history were reviewed and updated as appropriate: allergies, current medications, past family history, past medical history, past social history, past surgical history and problem list.  ROS Otherwise as in subjective above  Objective: BP 100/62   Pulse 72   Temp 97.8 F (36.6 C)   Wt 169 lb 3.2 oz (76.7 kg)   BMI 28.16 kg/m   General appearance: alert, no distress, well developed, well nourished Skin: scattered pinkish round 2-59m lesions flat for the most part, scattered on arms, torso anterior and posterior upper legs, breasts, but not on soles or feet.  None obvious on face. HEENT: normocephalic, sclerae anicteric, conjunctiva pink and moist, erythema,  pharynx normal Oral cavity: MMM, no lesions Neck: supple, no lymphadenopathy, no thyromegaly, no masses Abdomen: +bs, soft, non tender, non distended, no masses, no hepatomegaly, no splenomegaly Pulses: 2+ radial pulses, 2+ pedal pulses, normal cap refill Ext: no edema   Assessment: Encounter Diagnoses  Name Primary?   Rash Yes   Skin lesion    Screen for STD (sexually transmitted disease)    History of unprotected sex      Plan: Discussed the nonseptic rash, possible wide differential.  Labs for further eval.  Updated STD screen today.  Rosalee was seen today for rash.  Diagnoses and all orders for this visit:  Rash -     CBC -     Comprehensive metabolic panel -     Sedimentation  rate -     RPR+HIV+GC+CT Panel -     Hepatitis B surface antigen -     Hepatitis C antibody  Skin lesion -     CBC -     Comprehensive metabolic panel -     Sedimentation rate -     RPR+HIV+GC+CT Panel -     Hepatitis B surface antigen -     Hepatitis C antibody  Screen for STD (sexually transmitted disease) -     RPR+HIV+GC+CT Panel -     Hepatitis B surface antigen -     Hepatitis C antibody  History of unprotected sex -     RPR+HIV+GC+CT Panel -     Hepatitis B surface antigen -     Hepatitis C antibody    Follow up: pending labs

## 2022-05-17 NOTE — Telephone Encounter (Signed)
Pt called to see if it was ok to get COVID booster, she is going out of the country in next 2 weeks, ok per [place].  Pt informed

## 2022-05-21 LAB — CBC
Hematocrit: 40 % (ref 34.0–46.6)
Hemoglobin: 13.4 g/dL (ref 11.1–15.9)
MCH: 28.3 pg (ref 26.6–33.0)
MCHC: 33.5 g/dL (ref 31.5–35.7)
MCV: 84 fL (ref 79–97)
Platelets: 284 10*3/uL (ref 150–450)
RBC: 4.74 x10E6/uL (ref 3.77–5.28)
RDW: 13.1 % (ref 11.7–15.4)
WBC: 5.9 10*3/uL (ref 3.4–10.8)

## 2022-05-21 LAB — RPR+HIV+GC+CT PANEL
Chlamydia trachomatis, NAA: NEGATIVE
HIV Screen 4th Generation wRfx: NONREACTIVE
Neisseria Gonorrhoeae by PCR: NEGATIVE
RPR Ser Ql: NONREACTIVE

## 2022-05-21 LAB — COMPREHENSIVE METABOLIC PANEL
ALT: 15 IU/L (ref 0–32)
AST: 22 IU/L (ref 0–40)
Albumin/Globulin Ratio: 2.1 (ref 1.2–2.2)
Albumin: 4.7 g/dL (ref 3.8–4.8)
Alkaline Phosphatase: 63 IU/L (ref 44–121)
BUN/Creatinine Ratio: 17 (ref 12–28)
BUN: 14 mg/dL (ref 8–27)
Bilirubin Total: 0.6 mg/dL (ref 0.0–1.2)
CO2: 22 mmol/L (ref 20–29)
Calcium: 9.6 mg/dL (ref 8.7–10.3)
Chloride: 103 mmol/L (ref 96–106)
Creatinine, Ser: 0.81 mg/dL (ref 0.57–1.00)
Globulin, Total: 2.2 g/dL (ref 1.5–4.5)
Glucose: 97 mg/dL (ref 70–99)
Potassium: 4 mmol/L (ref 3.5–5.2)
Sodium: 142 mmol/L (ref 134–144)
Total Protein: 6.9 g/dL (ref 6.0–8.5)
eGFR: 80 mL/min/{1.73_m2} (ref >59)

## 2022-05-21 LAB — SEDIMENTATION RATE: Sed Rate: 3 mm/hr (ref 0–40)

## 2022-05-21 LAB — HEPATITIS B SURFACE ANTIGEN: Hepatitis B Surface Ag: NEGATIVE

## 2022-05-21 LAB — HEPATITIS C ANTIBODY: Hep C Virus Ab: NONREACTIVE

## 2022-05-31 ENCOUNTER — Encounter: Payer: Self-pay | Admitting: Internal Medicine

## 2022-07-04 ENCOUNTER — Ambulatory Visit (INDEPENDENT_AMBULATORY_CARE_PROVIDER_SITE_OTHER): Payer: Medicare Other | Admitting: Medical

## 2022-07-04 VITALS — BP 122/62 | HR 72 | Wt 169.8 lb

## 2022-07-04 DIAGNOSIS — Z9889 Other specified postprocedural states: Secondary | ICD-10-CM

## 2022-07-04 DIAGNOSIS — M25571 Pain in right ankle and joints of right foot: Secondary | ICD-10-CM

## 2022-07-04 DIAGNOSIS — M7072 Other bursitis of hip, left hip: Secondary | ICD-10-CM | POA: Diagnosis not present

## 2022-07-04 DIAGNOSIS — G8929 Other chronic pain: Secondary | ICD-10-CM | POA: Diagnosis not present

## 2022-07-04 DIAGNOSIS — M25562 Pain in left knee: Secondary | ICD-10-CM

## 2022-07-04 DIAGNOSIS — M25561 Pain in right knee: Secondary | ICD-10-CM | POA: Diagnosis not present

## 2022-07-04 MED ORDER — TRAMADOL HCL 50 MG PO TABS: 50.0000 mg | ORAL_TABLET | Freq: Two times a day (BID) | ORAL | 0 refills | Status: DC | PRN

## 2022-07-04 NOTE — Progress Notes (Signed)
Subjective:  Savannah [name] is a 66 y.o. female who presents for Chief Complaint  Patient presents with   other    Left knee pain x [id number]. Was on vacation and walking a lot, thinks it might be bursitis and did everything that ortho told her to do last year when she had this but not getting better. Would like a referral to PT     Here for left knee pain since July.  Has hx/o bilat knee replacement.  Has done PT in past.  Been using exercises that she was given prior but not improving.   Was on vacation for a week, was walking 7-8 miles daily during the vacation.  Had some pain in knees then but this pain worsened after she got back home.   No recent oral medication.   She can't take any NSAIDs in general.  These always upset her gut.   Has been using stretching, compression, elevation.  Dr. Latanya Maudlin did both prior knee surgeries.  Maybe some recent swelling.  Right ankle and left hurt both hurt at times, but not worse recently.  No other aggravating or relieving factors.   No other c/o.  Past Medical History:  Diagnosis Date   Adhesive capsulitis    BPPV (benign paroxysmal positional vertigo)    Breast cancer ([place]) 2008   Endometriosis    Fibromyalgia    GERD (gastroesophageal reflux disease)    Hyperlipidemia    Lymphedema    Malaria 1996   Personal history of radiation therapy 2008   PONV (postoperative nausea and vomiting)    Shingles 2002   Current Outpatient Medications on File Prior to Visit  Medication Sig Dispense Refill   atorvastatin (LIPITOR) 10 MG tablet Take 1 tablet (10 mg total) by mouth daily. 90 tablet 1   cetirizine (ZYRTEC) 10 MG chewable tablet Chew 10 mg by mouth daily.     Cholecalciferol (VITAMIN D) 125 MCG (5000 UT) CAPS Take 1 capsule by mouth daily. (Patient not taking: Reported on 07/04/2022)     No current facility-administered medications on file prior to visit.   Past Surgical History:  Procedure Laterality Date   BILATERAL SALPINGOOPHORECTOMY      BREAST LUMPECTOMY Right 2008   with right axillary node resection   CHOLECYSTECTOMY     LAPAROSCOPY     MASS EXCISION Right 04/30/2018   Procedure: EXCISION CYST DEBRIDEMENT INTERPHALANGEAL JOINT RIGHT THUMB;  Surgeon: Daryll Brod, MD;  Location: [facility];  Service: Orthopedics;  Laterality: Right;   TONSILLECTOMY     TOTAL KNEE ARTHROPLASTY Bilateral 07/2018, 10/2018   VAGINAL HYSTERECTOMY       The following portions of the patient's history were reviewed and updated as appropriate: allergies, current medications, past family history, past medical history, past social history, past surgical history and problem list.  ROS Otherwise as in subjective above    Objective: BP 122/62   Pulse 72   Wt 169 lb 12.8 oz (77 kg)   BMI 28.26 kg/m   General appearance: alert, no distress, well developed, well nourished Hip range of motion normal, tender slightly over the left greater trochanter bursa Tender over bilateral medial knees more so on the left, tender over the medial joint line and MCL on the left, no swelling, nontender otherwise the knees, there seems to be slight laxity with the ACL of both knees, surgical scars of anterior knees on both knees, range of motion otherwise normal, mild tenderness over right ankle over the calcaneofibular  ligament, otherwise ankle is nontender in both ankles with normal range of motion Legs neurovascularly intact Pulses: 2+ radial pulses, 2+ pedal pulses, normal cap refill Ext: no edema   Assessment: Encounter Diagnoses  Name Primary?   Chronic pain of both knees Yes   Bursitis of left hip, unspecified bursa    Chronic pain of right ankle    History of knee surgery      Plan: We discussed her symptoms and ongoing intermittent pains with recent acute flareup of pain after several days of long mileage walking.  For now continue relative rest, ice, elevation, compression such as knee sleeves.  Can use Ultram below as needed for  worse pain.  Otherwise can use Tylenol for milder pain.  She does not do well with NSAIDs due to GI upset.  Referral to physical therapy at her request for further treatment.   [name] was seen today for other.  Diagnoses and all orders for this visit:  Chronic pain of both knees  Bursitis of left hip, unspecified bursa  Chronic pain of right ankle  History of knee surgery  Other orders -     traMADol (ULTRAM) 50 MG tablet; Take 1 tablet (50 mg total) by mouth 2 (two) times daily as needed for up to 5 days.   Follow up: pending PT referral

## 2022-07-04 NOTE — Addendum Note (Signed)
Addended by: Minette Headland A on: 07/04/2022 03:15 PM   Modules accepted: Orders

## 2022-08-01 ENCOUNTER — Telehealth: Payer: Self-pay | Admitting: Physician Assistant

## 2022-08-01 DIAGNOSIS — E785 Hyperlipidemia, unspecified: Secondary | ICD-10-CM

## 2022-08-01 MED ORDER — ATORVASTATIN CALCIUM 10 MG PO TABS: 10.0000 mg | ORAL_TABLET | Freq: Every day | ORAL | 0 refills | Status: DC

## 2022-08-01 NOTE — Telephone Encounter (Signed)
Pt needs refills atorvastatin to [facility] wants 30 days (NOT MAIL ORDER)

## 2022-08-01 NOTE — Telephone Encounter (Signed)
Done

## 2022-08-03 NOTE — Telephone Encounter (Signed)
V handled

## 2022-08-07 ENCOUNTER — Encounter: Payer: Self-pay | Admitting: Internal Medicine

## 2022-08-27 ENCOUNTER — Other Ambulatory Visit: Payer: Self-pay | Admitting: Family Medicine

## 2022-08-27 DIAGNOSIS — E785 Hyperlipidemia, unspecified: Secondary | ICD-10-CM

## 2022-09-10 ENCOUNTER — Encounter: Payer: Self-pay | Admitting: Internal Medicine

## 2022-09-20 ENCOUNTER — Other Ambulatory Visit: Payer: Self-pay | Admitting: Obstetrics and Gynecology

## 2022-09-20 ENCOUNTER — Ambulatory Visit (INDEPENDENT_AMBULATORY_CARE_PROVIDER_SITE_OTHER): Payer: Medicare Other

## 2022-09-20 VITALS — Ht 66.0 in | Wt 163.0 lb

## 2022-09-20 DIAGNOSIS — Z0000 Encounter for general adult medical examination without abnormal findings: Secondary | ICD-10-CM

## 2022-09-20 DIAGNOSIS — Z1231 Encounter for screening mammogram for malignant neoplasm of breast: Secondary | ICD-10-CM

## 2022-09-20 NOTE — Patient Instructions (Signed)
Savannah [name] , Thank you for taking time to come for your Medicare Wellness Visit. I appreciate your ongoing commitment to your health goals. Please review the following plan we discussed and let me know if I can assist you in the future.   Screening recommendations/referrals: Colonoscopy: n/a Mammogram: patient to schedule Bone Density: completed 04/01/2018 Recommended yearly ophthalmology/optometry visit for glaucoma screening and checkup Recommended yearly dental visit for hygiene and checkup  Vaccinations: Influenza vaccine: due Pneumococcal vaccine: due Tdap vaccine: completed 11/19/2018, due 11/19/2028 Shingles vaccine: completed   Covid-19: 08/26/2022, 03/14/2021, 03/22/2020, 02/18/2020  Advanced directives: Please bring a copy of your POA (Power of Attorney) and/or Living Will to your next appointment.   Conditions/risks identified: none  Next appointment: Follow up in one year for your annual wellness visit    Preventive Care 65 Years and Older, Female Preventive care refers to lifestyle choices and visits with your health care provider that can promote health and wellness. What does preventive care include? A yearly physical exam. This is also called an annual well check. Dental exams once or twice a year. Routine eye exams. Ask your health care provider how often you should have your eyes checked. Personal lifestyle choices, including: Daily care of your teeth and gums. Regular physical activity. Eating a healthy diet. Avoiding tobacco and drug use. Limiting alcohol use. Practicing safe sex. Taking low-dose aspirin every day. Taking vitamin and mineral supplements as recommended by your health care provider. What happens during an annual well check? The services and screenings done by your health care provider during your annual well check will depend on your age, overall health, lifestyle risk factors, and family history of disease. Counseling  Your health care provider  may ask you questions about your: Alcohol use. Tobacco use. Drug use. Emotional well-being. Home and relationship well-being. Sexual activity. Eating habits. History of falls. Memory and ability to understand (cognition). Work and work Statistician. Reproductive health. Screening  You may have the following tests or measurements: Height, weight, and BMI. Blood pressure. Lipid and cholesterol levels. These may be checked every 5 years, or more frequently if you are over 66 years old. Skin check. Lung cancer screening. You may have this screening every year starting at age 83 if you have a 30-pack-year history of smoking and currently smoke or have quit within the past 15 years. Fecal occult blood test (FOBT) of the stool. You may have this test every year starting at age 69. Flexible sigmoidoscopy or colonoscopy. You may have a sigmoidoscopy every 5 years or a colonoscopy every 10 years starting at age 64. Hepatitis C blood test. Hepatitis B blood test. Sexually transmitted disease (STD) testing. Diabetes screening. This is done by checking your blood sugar (glucose) after you have not eaten for a while (fasting). You may have this done every 1-3 years. Bone density scan. This is done to screen for osteoporosis. You may have this done starting at age 34. Mammogram. This may be done every 1-2 years. Talk to your health care provider about how often you should have regular mammograms. Talk with your health care provider about your test results, treatment options, and if necessary, the need for more tests. Vaccines  Your health care provider may recommend certain vaccines, such as: Influenza vaccine. This is recommended every year. Tetanus, diphtheria, and acellular pertussis (Tdap, Td) vaccine. You may need a Td booster every 10 years. Zoster vaccine. You may need this after age 36. Pneumococcal 13-valent conjugate (PCV13) vaccine. One dose is  recommended after age 66. Pneumococcal  polysaccharide (PPSV23) vaccine. One dose is recommended after age 43. Talk to your health care provider about which screenings and vaccines you need and how often you need them. This information is not intended to replace advice given to you by your health care provider. Make sure you discuss any questions you have with your health care provider. Document Released: 12/15/2015 Document Revised: 08/07/2016 Document Reviewed: 09/19/2015 Elsevier Interactive Patient Education  2017 [place] Prevention in the Home Falls can cause injuries. They can happen to people of all ages. There are many things you can do to make your home safe and to help prevent falls. What can I do on the outside of my home? Regularly fix the edges of walkways and driveways and fix any cracks. Remove anything that might make you trip as you walk through a door, such as a raised step or threshold. Trim any bushes or trees on the path to your home. Use bright outdoor lighting. Clear any walking paths of anything that might make someone trip, such as rocks or tools. Regularly check to see if handrails are loose or broken. Make sure that both sides of any steps have handrails. Any raised decks and porches should have guardrails on the edges. Have any leaves, snow, or ice cleared regularly. Use sand or salt on walking paths during winter. Clean up any spills in your garage right away. This includes oil or grease spills. What can I do in the bathroom? Use night lights. Install grab bars by the toilet and in the tub and shower. Do not use towel bars as grab bars. Use non-skid mats or decals in the tub or shower. If you need to sit down in the shower, use a plastic, non-slip stool. Keep the floor dry. Clean up any water that spills on the floor as soon as it happens. Remove soap buildup in the tub or shower regularly. Attach bath mats securely with double-sided non-slip rug tape. Do not have throw rugs and other  things on the floor that can make you trip. What can I do in the bedroom? Use night lights. Make sure that you have a light by your bed that is easy to reach. Do not use any sheets or blankets that are too big for your bed. They should not hang down onto the floor. Have a firm chair that has side arms. You can use this for support while you get dressed. Do not have throw rugs and other things on the floor that can make you trip. What can I do in the kitchen? Clean up any spills right away. Avoid walking on wet floors. Keep items that you use a lot in easy-to-reach places. If you need to reach something above you, use a strong step stool that has a grab bar. Keep electrical cords out of the way. Do not use floor polish or wax that makes floors slippery. If you must use wax, use non-skid floor wax. Do not have throw rugs and other things on the floor that can make you trip. What can I do with my stairs? Do not leave any items on the stairs. Make sure that there are handrails on both sides of the stairs and use them. Fix handrails that are broken or loose. Make sure that handrails are as long as the stairways. Check any carpeting to make sure that it is firmly attached to the stairs. Fix any carpet that is loose or worn. Avoid having  throw rugs at the top or bottom of the stairs. If you do have throw rugs, attach them to the floor with carpet tape. Make sure that you have a light switch at the top of the stairs and the bottom of the stairs. If you do not have them, ask someone to add them for you. What else can I do to help prevent falls? Wear shoes that: Do not have high heels. Have rubber bottoms. Are comfortable and fit you well. Are closed at the toe. Do not wear sandals. If you use a stepladder: Make sure that it is fully opened. Do not climb a closed stepladder. Make sure that both sides of the stepladder are locked into place. Ask someone to hold it for you, if possible. Clearly  mark and make sure that you can see: Any grab bars or handrails. First and last steps. Where the edge of each step is. Use tools that help you move around (mobility aids) if they are needed. These include: Canes. Walkers. Scooters. Crutches. Turn on the lights when you go into a dark area. Replace any light bulbs as soon as they burn out. Set up your furniture so you have a clear path. Avoid moving your furniture around. If any of your floors are uneven, fix them. If there are any pets around you, be aware of where they are. Review your medicines with your doctor. Some medicines can make you feel dizzy. This can increase your chance of falling. Ask your doctor what other things that you can do to help prevent falls. This information is not intended to replace advice given to you by your health care provider. Make sure you discuss any questions you have with your health care provider. Document Released: 09/14/2009 Document Revised: 04/25/2016 Document Reviewed: 12/23/2014 Elsevier Interactive Patient Education  2017 [employer].

## 2022-09-20 NOTE — Progress Notes (Signed)
I connected with Savannah [name] today by telephone and verified that I am speaking with the correct person using two identifiers. Location patient: home Location provider: work Persons participating in the virtual visit: Savannah [name], [place] LPN.   I discussed the limitations, risks, security and privacy concerns of performing an evaluation and management service by telephone and the availability of in person appointments. I also discussed with the patient that there may be a patient responsible charge related to this service. The patient expressed understanding and verbally consented to this telephonic visit.    Interactive audio and video telecommunications were attempted between this provider and patient, however failed, due to patient having technical difficulties OR patient did not have access to video capability.  We continued and completed visit with audio only.     Vital signs may be patient reported or missing.  Subjective:   Savannah [name] is a 65 y.o. female who presents for an Initial Medicare Annual Wellness Visit.  Review of Systems     Cardiac Risk Factors include: advanced age (>55mn, >>20women)     Objective:    Today's Vitals   09/20/22 1356  Weight: 163 lb (73.9 kg)  Height: '5\' 6"'$  (1.676 m)  PainSc: 4    Body mass index is 26.31 kg/m.     09/20/2022    2:04 PM 09/10/2021    1:56 PM 05/14/2018   11:18 AM 04/30/2018    9:01 AM 04/23/2018    3:27 PM 04/23/2018    3:24 PM 03/14/2017   10:29 AM  Advanced Directives  Does Patient Have a Medical Advance Directive? Yes Yes Yes Yes Yes Yes Yes  Type of [place] Living will Living will;Healthcare Power of [place]okfield Center[place] will  Does patient want to make changes to medical advance directive?  No - Patient declined No - Patient declined No - Patient declined     Copy of [place] Chart? No - copy requested  No - copy requested No - copy requested       Current Medications (verified) Outpatient Encounter Medications as of 09/20/2022  Medication Sig   atorvastatin (LIPITOR) 10 MG tablet Take 1 tablet by mouth once daily   cetirizine (ZYRTEC) 10 MG chewable tablet Chew 10 mg by mouth daily.   fluticasone (FLONASE) 50 MCG/ACT nasal spray Place 2 sprays into both nostrils daily.   traMADol (ULTRAM) 50 MG tablet    traZODone (DESYREL) 50 MG tablet    Cholecalciferol (VITAMIN D) 125 MCG (5000 UT) CAPS Take 1 capsule by mouth daily. (Patient not taking: Reported on 07/04/2022)   No facility-administered encounter medications on file as of 09/20/2022.    Allergies (verified) Darvon   History: Past Medical History:  Diagnosis Date   Adhesive capsulitis    BPPV (benign paroxysmal positional vertigo)    Breast cancer ([facility] 2008   Endometriosis    Fibromyalgia    GERD (gastroesophageal reflux disease)    Hyperlipidemia    Lymphedema    Malaria 1996   Personal history of radiation therapy 2008   PONV (postoperative nausea and vomiting)    Shingles 2002   Past Surgical History:  Procedure Laterality Date   BILATERAL SALPINGOOPHORECTOMY     BREAST LUMPECTOMY Right 2008   with right axillary node resection   CHOLECYSTECTOMY     LAPAROSCOPY     MASS EXCISION Right 04/30/2018  Procedure: EXCISION CYST DEBRIDEMENT INTERPHALANGEAL JOINT RIGHT THUMB;  Surgeon: Daryll Brod, MD;  Location: [facility];  Service: Orthopedics;  Laterality: Right;   TONSILLECTOMY     TOTAL KNEE ARTHROPLASTY Bilateral 07/2018, 10/2018   VAGINAL HYSTERECTOMY     Family History  Problem Relation Age of Onset   Cancer Mother    Rectal cancer Mother    Stroke Mother    Heart disease Father    Heart attack Father    Crohn's disease Maternal Uncle    Liver disease Neg Hx    Colon polyps Neg Hx    Social History   Socioeconomic History   Marital status:  Single    Spouse name: Not on file   Number of children: Not on file   Years of education: Not on file   Highest education level: Not on file  Occupational History   Not on file  Tobacco Use   Smoking status: Never   Smokeless tobacco: Never  Vaping Use   Vaping Use: Never used  Substance and Sexual Activity   Alcohol use: Not Currently    Comment: social-2+ drinks per week/ not currently drinking in the last 6 months.    Drug use: Never   Sexual activity: Not on file  Other Topics Concern   Not on file  Social History Narrative   Not on file   Social Determinants of Health   Financial Resource Strain: Low Risk  (09/20/2022)   Overall Financial Resource Strain (CARDIA)    Difficulty of Paying Living Expenses: Not hard at all  Food Insecurity: No Food Insecurity (09/20/2022)   Hunger Vital Sign    Worried About Running Out of Food in the Last Year: Never true    Ran Out of Food in the Last Year: Never true  Transportation Needs: No Transportation Needs (09/20/2022)   PRAPARE - Hydrologist (Medical): No    Lack of Transportation (Non-Medical): No  Physical Activity: Inactive (09/20/2022)   Exercise Vital Sign    Days of Exercise per Week: 0 days    Minutes of Exercise per Session: 0 min  Stress: Stress Concern Present (09/20/2022)   [place]    Feeling of Stress : To some extent  Social Connections: Not on file    Tobacco Counseling Counseling given: Not Answered   Clinical Intake:  Pre-visit preparation completed: Yes  Pain : 0-10 Pain Score: 4  Pain Type: Chronic pain Pain Location: Generalized Pain Descriptors / Indicators: Aching Pain Onset: More than a month ago Pain Frequency: Constant     Nutritional Status: BMI 25 -29 Overweight Nutritional Risks: None Diabetes: No  How often do you need to have someone help you when you read instructions,  pamphlets, or other written materials from your doctor or pharmacy?: 1 - Never What is the last grade level you completed in school?: bachelors degree  Diabetic? no  Interpreter Needed?: No  Information entered by :: NAllen LPN   Activities of Daily Living    09/20/2022    2:06 PM  In your present state of health, do you have any difficulty performing the following activities:  Hearing? 0  Vision? 1  Comment sometimes has ocular migraines  Difficulty concentrating or making decisions? 1  Comment more forgetful  Walking or climbing stairs? 0  Dressing or bathing? 0  Doing errands, shopping? 0  Preparing Food and eating ? N  Using the  Toilet? N  In the past six months, have you accidently leaked urine? Y  Do you have problems with loss of bowel control? N  Managing your Medications? N  Managing your Finances? N  Housekeeping or managing your Housekeeping? N    Patient Care Team: Marcellina Millin as PCP - General (Physician Assistant)  Indicate any recent Medical Services you may have received from other than [facility] providers in the past year (date may be approximate).     Assessment:   This is a routine wellness examination for Savannah [name].  Hearing/Vision screen Vision Screening - Comments:: Regular eye exams, Dr. Roderic Palau  Dietary issues and exercise activities discussed: Current Exercise Habits: The patient does not participate in regular exercise at present   Goals Addressed             This Visit's Progress    Patient Stated       09/20/2022, wants to lose 10 pounds and wants exercise to be regular, eat healthier       Depression Screen    09/20/2022    2:05 PM 07/04/2022    1:54 PM 09/10/2021    1:53 PM 04/25/2021   10:49 AM 03/19/2021    3:39 PM  PHQ 2/9 Scores  PHQ - 2 Score 0 0 0 0 0  PHQ- 9 Score 3        Fall Risk    09/20/2022    2:05 PM 07/04/2022    1:54 PM 09/10/2021    1:53 PM 04/25/2021   10:49 AM 03/19/2021    3:39 PM  [place] in the past year? 0 0 0 0 0  Number falls in past yr: 0 0 0 0 0  Injury with Fall? 0 0 0 0 0  Risk for fall due to : Medication side effect No Fall Risks History of fall(s) History of fall(s) No Fall Risks  Follow up Falls prevention discussed;Education provided;Falls evaluation completed Falls evaluation completed Falls prevention discussed;Falls evaluation completed Falls evaluation completed Falls evaluation completed    FALL RISK PREVENTION PERTAINING TO THE HOME:  Any stairs in or around the home? Yes  If so, are there any without handrails? Yes  Home free of loose throw rugs in walkways, pet beds, electrical cords, etc? Yes  Adequate lighting in your home to reduce risk of falls? Yes   ASSISTIVE DEVICES UTILIZED TO PREVENT FALLS:  Life alert? No  Use of a cane, walker or w/c? No  Grab bars in the bathroom? Yes  Shower chair or bench in shower? No  Elevated toilet seat or a handicapped toilet? Yes   TIMED UP AND GO:  Was the test performed? No .      Cognitive Function:        09/20/2022    2:09 PM  6CIT Screen  What Year? 0 points  What month? 0 points  What time? 0 points  Count back from 20 0 points  Months in reverse 0 points  Repeat phrase 2 points  Total Score 2 points    Immunizations Immunization History  Administered Date(s) Administered   Fluad Quad(high Dose 65+) 09/10/2021   Influenza,inj,Quad PF,6+ Mos 09/16/2016, 11/18/2017, 09/07/2019   Moderna Sars-Covid-2 Vaccination 02/18/2020, 03/22/2020, 03/14/2021   Pneumococcal Conjugate-13 09/16/2016   Respiratory Syncytial Virus Vaccine,Recomb Aduvanted(Arexvy) 08/26/2022   Td 12/18/2006, 11/19/2018   Unspecified SARS-COV-2 Vaccination 08/26/2022   Zoster Recombinat (Shingrix) 05/25/2018, 10/10/2018   Zoster, Live 05/27/2018, 10/11/2018  TDAP status: Up to date  Flu Vaccine status: Due, Education has been provided regarding the importance of this vaccine. Advised may receive  this vaccine at local pharmacy or Health Dept. Aware to provide a copy of the vaccination record if obtained from local pharmacy or Health Dept. Verbalized acceptance and understanding.  Pneumococcal vaccine status: Up to date  Covid-19 vaccine status: Completed vaccines  Qualifies for Shingles Vaccine? Yes   Zostavax completed Yes   Shingrix Completed?: Yes  Screening Tests Health Maintenance  Topic Date Due   Pneumonia Vaccine 14+ Years old (2 - PPSV23 or PCV20) 11/13/2020   INFLUENZA VACCINE  07/02/2022   COVID-19 Vaccine (5 - Moderna risk series) 10/21/2022   MAMMOGRAM  12/07/2022   COLONOSCOPY (Pts 45-20yr Insurance coverage will need to be confirmed)  06/30/2028   TETANUS/TDAP  11/19/2028   DEXA SCAN  Completed   Hepatitis C Screening  Completed   Zoster Vaccines- Shingrix  Completed   HPV VACCINES  Aged Out    Health Maintenance  Health Maintenance Due  Topic Date Due   Pneumonia Vaccine 67 Years old (2 - PPSV23 or PCV20) 11/13/2020   INFLUENZA VACCINE  07/02/2022    Colorectal cancer screening: No longer required.   Mammogram status: patient to schedule  Bone Density status: Completed 04/01/2018.   Lung Cancer Screening: (Low Dose CT Chest recommended if Age 67-80years, 30 pack-year currently smoking OR have quit w/in 15years.) does not qualify.   Lung Cancer Screening Referral: no  Additional Screening:  Hepatitis C Screening: does qualify; Completed 05/17/2022  Vision Screening: Recommended annual ophthalmology exams for early detection of glaucoma and other disorders of the eye. Is the patient up to date with their annual eye exam?  Yes  Who is the provider or what is the name of the office in which the patient attends annual eye exams? Dr. ORoderic PalauIf pt is not established with a provider, would they like to be referred to a provider to establish care? No .   Dental Screening: Recommended annual dental exams for proper oral hygiene  Community  Resource Referral / Chronic Care Management: CRR required this visit?  No   CCM required this visit?  No      Plan:     I have personally reviewed and noted the following in the patient's chart:   Medical and social history Use of alcohol, tobacco or illicit drugs  Current medications and supplements including opioid prescriptions. Patient is not currently taking opioid prescriptions. Functional ability and status Nutritional status Physical activity Advanced directives List of other physicians Hospitalizations, surgeries, and ER visits in previous 12 months Vitals Screenings to include cognitive, depression, and falls Referrals and appointments  In addition, I have reviewed and discussed with patient certain preventive protocols, quality metrics, and best practice recommendations. A written personalized care plan for preventive services as well as general preventive health recommendations were provided to patient.     NKellie Simmering LPN   [id number]  Nurse Notes: none  Due to this being a virtual visit, the after visit summary with patients personalized plan was offered to patient via mail or my-chart. Patient would like to access on my-chart

## 2022-09-22 NOTE — Progress Notes (Unsigned)
No chief complaint on file.    PMH, PSH, SH reviewed    ROS:  PHYSICAL EXAM:  There were no vitals taken for this visit.  Wt Readings from Last 3 Encounters:  09/20/22 163 lb (73.9 kg)  07/04/22 169 lb 12.8 oz (77 kg)  05/17/22 169 lb 3.2 oz (76.7 kg)     ASSESSMENT/PLAN:  GAD-7  Flu, COVID Pneumovax also due--wouldn't give 3 (if declines covid, could give pneumovax and flu) Fix shingles vaccines (listed as shingrix and LIVE--remove the live ones)

## 2022-09-23 ENCOUNTER — Encounter: Payer: Self-pay | Admitting: Family Medicine

## 2022-09-23 ENCOUNTER — Ambulatory Visit (INDEPENDENT_AMBULATORY_CARE_PROVIDER_SITE_OTHER): Payer: Medicare Other | Admitting: Family Medicine

## 2022-09-23 VITALS — BP 130/80 | HR 64 | Ht 66.0 in | Wt 168.6 lb

## 2022-09-23 DIAGNOSIS — F419 Anxiety disorder, unspecified: Secondary | ICD-10-CM

## 2022-09-23 DIAGNOSIS — Z7185 Encounter for immunization safety counseling: Secondary | ICD-10-CM | POA: Diagnosis not present

## 2022-09-23 MED ORDER — ALPRAZOLAM 0.25 MG PO TABS: 0.2500 mg | ORAL_TABLET | Freq: Three times a day (TID) | ORAL | 0 refills | Status: DC | PRN

## 2022-09-23 NOTE — Patient Instructions (Addendum)
I recommend getting a high dose flu shot yearly. I also recommend that you get the Pneumovax-23 (do not let the pharmacy give you Prevnar-20, that's not the pneumonia vaccine that you need).  Switch to Tylenol Arthritis at bedtime--this lasts longer so it shouldn't wear off in the middle of the night (lessening the potential for pain to interrupt your sleep).  Try to cut out alcohol (at least this week)--as this contributes to awakening at night and having trouble getting back to sleep. Work on some of the relaxation and visualization techniques we discussed.  Use the alprazolam (xanax) as needed. I'm prescribing 0.'25mg'$  dose--not sure if you used this or a higher dose in the past. If using during the day, you can start with 1/2 tablet--if it does nothing, take the other half 20 minutes later. Don't use more than 1 tablet during the day, and use caution with driving--it may slow your reflexes some even if you don't feel particularly drowsy. You may use up to 2 tablets in the evening, if needed--but NOT mixed with alcohol OR trazodone. You could use a small dose of the xanax in the early morning hours if the other techniques fail (even if you took trazodone)--just make sure you are alert and okay to drive later that morning.  We briefly discussed the 2 breathing techniques (breathe the box or 4/4/6/2 and the more invigorating technique to wake you up), and also the beach visualization technique.

## 2022-10-15 ENCOUNTER — Encounter: Payer: Self-pay | Admitting: Internal Medicine

## 2022-11-01 ENCOUNTER — Encounter: Payer: Self-pay | Admitting: Nurse Practitioner

## 2022-11-01 ENCOUNTER — Ambulatory Visit (INDEPENDENT_AMBULATORY_CARE_PROVIDER_SITE_OTHER): Payer: Medicare Other | Admitting: Nurse Practitioner

## 2022-11-01 VITALS — BP 124/80 | HR 80 | Temp 98.6°F | Wt 166.8 lb

## 2022-11-01 DIAGNOSIS — Z0000 Encounter for general adult medical examination without abnormal findings: Secondary | ICD-10-CM

## 2022-11-01 DIAGNOSIS — E785 Hyperlipidemia, unspecified: Secondary | ICD-10-CM

## 2022-11-01 DIAGNOSIS — R0989 Other specified symptoms and signs involving the circulatory and respiratory systems: Secondary | ICD-10-CM | POA: Diagnosis not present

## 2022-11-01 NOTE — Progress Notes (Signed)
Orma Render, DNP, AGNP-c [facility] [address] [place], [place] [address] [phone]  Subjective:   Savannah [name] is a 66 y.o. female presents to day for evaluation of: Fullness in throat [name] endorses concerns with a sensation of globus in the throat for over a month now. She reports that while taking a pill she felt that the pill became stuck on a "ledge" in the throat and she had a difficult time dislodging the pill from the area. She reports since that time she has had a sensation that anything she eats, drinks, or swallows becomes lodged in the throat about the area of the thyroid. She denies difficulty breathing, sensation of throat swelling, gerd symptoms, nausea, vomiting, or physical choking.   PMH, Medications, and Allergies reviewed and updated in chart as appropriate.   ROS negative except for what is listed in HPI. Objective:  BP 124/80   Pulse 80   Temp 98.6 F (37 C)   Wt 166 lb 12.8 oz (75.7 kg)   BMI 26.92 kg/m  Physical Exam Vitals and nursing note reviewed.  Constitutional:      Appearance: Normal appearance.  HENT:     Head: Normocephalic and atraumatic.     Nose: Nose normal.     Mouth/Throat:     Lips: No lesions.     Mouth: Mucous membranes are moist.     Dentition: Normal dentition.     Tongue: No lesions. Tongue does not deviate from midline.     Palate: No mass and lesions.     Pharynx: Oropharynx is clear. Uvula midline. No pharyngeal swelling, oropharyngeal exudate, posterior oropharyngeal erythema or uvula swelling.     Tonsils: No tonsillar exudate or tonsillar abscesses. 1+ on the right. 1+ on the left.  Neck:     Thyroid: Thyromegaly present.     Vascular: No carotid bruit.  Cardiovascular:     Rate and Rhythm: Normal rate and regular rhythm.     Pulses: Normal pulses.     Heart sounds: Normal heart sounds.  Pulmonary:     Effort: Pulmonary effort is normal.     Breath sounds: Normal breath sounds.   Abdominal:     Palpations: Abdomen is soft.  Musculoskeletal:     Cervical back: Normal range of motion.  Lymphadenopathy:     Cervical: No cervical adenopathy.  Skin:    General: Skin is warm and dry.     Capillary Refill: Capillary refill takes less than 2 seconds.  Neurological:     General: No focal deficit present.     Mental Status: She is alert and oriented to person, place, and time.  Psychiatric:        Mood and Affect: Mood normal.           Assessment & Plan:   Problem List Items Addressed This Visit     Hyperlipidemia   Relevant Orders   Lipid panel (Completed)   Throat fullness - Primary    Sensation of globus of unknown etiology. On exam thyroid palpable and does appear full. Given her symptoms, I do feel that ultrasound of the neck and thyroid is a reasonable next step to help determine the cause of the symptoms. A barium swallow may be required if this is not informative. We will obtain labs today for future AWV and thyroid monitoring and ultrasound ordered. Will make changes to plan to care based on findings.       Relevant Orders   T4, free (  Completed)   TSH (Completed)   CBC with Differential/Platelet (Completed)   US Soft Tissue Head/Neck (NON-THYROID)   US THYROID   Other Visit Diagnoses     Encounter for Medicare annual wellness exam       Relevant Orders   T4, free (Completed)   TSH (Completed)   CBC with Differential/Platelet (Completed)   Comprehensive metabolic panel (Completed)       Time: 30 minutes, >50% spent counseling, care coordination, chart review, and documentation.    Orma Render, DNP, AGNP-c 11/14/2022  7:19 PM    History, Medications, Surgery, SDOH, and Family History reviewed and updated as appropriate.

## 2022-11-01 NOTE — Patient Instructions (Signed)
We will see what your labs and ultrasound shows and determine our next best steps.

## 2022-11-02 LAB — COMPREHENSIVE METABOLIC PANEL
ALT: 16 IU/L (ref 0–32)
AST: 22 IU/L (ref 0–40)
Albumin/Globulin Ratio: 2.3 — ABNORMAL HIGH (ref 1.2–2.2)
Albumin: 4.6 g/dL (ref 3.9–4.9)
Alkaline Phosphatase: 67 IU/L (ref 44–121)
BUN/Creatinine Ratio: 16 (ref 12–28)
BUN: 12 mg/dL (ref 8–27)
Bilirubin Total: 0.5 mg/dL (ref 0.0–1.2)
CO2: 25 mmol/L (ref 20–29)
Calcium: 9.4 mg/dL (ref 8.7–10.3)
Chloride: 101 mmol/L (ref 96–106)
Creatinine, Ser: 0.74 mg/dL (ref 0.57–1.00)
Globulin, Total: 2 g/dL (ref 1.5–4.5)
Glucose: 90 mg/dL (ref 70–99)
Potassium: 4.5 mmol/L (ref 3.5–5.2)
Sodium: 140 mmol/L (ref 134–144)
Total Protein: 6.6 g/dL (ref 6.0–8.5)
eGFR: 89 mL/min/{1.73_m2} (ref >59)

## 2022-11-02 LAB — T4, FREE: Free T4: 1.18 ng/dL (ref 0.82–1.77)

## 2022-11-02 LAB — CBC WITH DIFFERENTIAL/PLATELET
Basophils Absolute: 0.1 10*3/uL (ref 0.0–0.2)
Basos: 2 %
EOS (ABSOLUTE): 0.1 10*3/uL (ref 0.0–0.4)
Eos: 2 %
Hematocrit: 41.4 % (ref 34.0–46.6)
Hemoglobin: 13.7 g/dL (ref 11.1–15.9)
Immature Grans (Abs): 0 10*3/uL (ref 0.0–0.1)
Immature Granulocytes: 0 %
Lymphocytes Absolute: 1.1 10*3/uL (ref 0.7–3.1)
Lymphs: 20 %
MCH: 29.3 pg (ref 26.6–33.0)
MCHC: 33.1 g/dL (ref 31.5–35.7)
MCV: 89 fL (ref 79–97)
Monocytes Absolute: 0.4 10*3/uL (ref 0.1–0.9)
Monocytes: 6 %
Neutrophils Absolute: 3.8 10*3/uL (ref 1.4–7.0)
Neutrophils: 70 %
Platelets: 303 10*3/uL (ref 150–450)
RBC: 4.68 x10E6/uL (ref 3.77–5.28)
RDW: 12.9 % (ref 11.7–15.4)
WBC: 5.5 10*3/uL (ref 3.4–10.8)

## 2022-11-02 LAB — LIPID PANEL
Chol/HDL Ratio: 2.7 ratio (ref 0.0–4.4)
Cholesterol, Total: 237 mg/dL — ABNORMAL HIGH (ref 100–199)
HDL: 89 mg/dL (ref >39)
LDL Chol Calc (NIH): 135 mg/dL — ABNORMAL HIGH (ref 0–99)
Triglycerides: 75 mg/dL (ref 0–149)
VLDL Cholesterol Cal: 13 mg/dL (ref 5–40)

## 2022-11-02 LAB — TSH: TSH: 1.04 u[IU]/mL (ref 0.450–4.500)

## 2022-11-06 ENCOUNTER — Ambulatory Visit: Payer: Medicare Other | Admitting: Medical

## 2022-11-07 ENCOUNTER — Encounter: Payer: Self-pay | Admitting: Nurse Practitioner

## 2022-11-07 ENCOUNTER — Ambulatory Visit (INDEPENDENT_AMBULATORY_CARE_PROVIDER_SITE_OTHER): Payer: Medicare Other | Admitting: Nurse Practitioner

## 2022-11-07 VITALS — BP 120/80 | HR 83 | Temp 98.3°F | Ht 65.5 in | Wt 168.6 lb

## 2022-11-07 DIAGNOSIS — Z0000 Encounter for general adult medical examination without abnormal findings: Secondary | ICD-10-CM

## 2022-11-07 DIAGNOSIS — F419 Anxiety disorder, unspecified: Secondary | ICD-10-CM | POA: Diagnosis not present

## 2022-11-07 MED ORDER — TRAZODONE HCL 50 MG PO TABS: 50.0000 mg | ORAL_TABLET | Freq: Every day | ORAL | 3 refills | Status: DC

## 2022-11-07 MED ORDER — HYDROXYZINE HCL 50 MG PO TABS: 50.0000 mg | ORAL_TABLET | Freq: Every evening | ORAL | 3 refills | Status: DC | PRN

## 2022-11-07 NOTE — Progress Notes (Signed)
Worthy Keeler, DNP, AGNP-c [place],  [address] Main Office [phone]  BP 120/80   Pulse 83   Temp 98.3 F (36.8 C)   Ht 5' 5.5" (1.664 m)   Wt 168 lb 9.6 oz (76.5 kg)   BMI 27.63 kg/m    Subjective:    Patient ID: Savannah [name], female    DOB: Apr 13, 1956, 66 y.o.   MRN: [id number]  HPI: Savannah [name] is a 66 y.o. female presenting on 11/07/2022 for comprehensive medical examination.   Current medical concerns include:difficulty sleeping and anxiety  She reports regular vision exams q1-5y: Yes  She reports regular dental exams q 26m  Yes  The patient eats a regular, healthy diet. She is on a gluten free diet. She endorses exercise and/or activity of:  routine activity A comprehensive review of systems was negative.  Most Recent Depression Screen:     11/07/2022   11:08 AM 09/20/2022    2:05 PM 07/04/2022    1:54 PM 09/10/2021    1:53 PM 04/25/2021   10:49 AM  Depression screen PHQ 2/9  Decreased Interest 0 0 0 0 0  Down, Depressed, Hopeless 0 0 0 0 0  PHQ - 2 Score 0 0 0 0 0  Altered sleeping  3     Tired, decreased energy  0     Change in appetite  0     Feeling bad or failure about yourself   0     Trouble concentrating  0     Moving slowly or fidgety/restless  0     Suicidal thoughts  0     PHQ-9 Score  3     Difficult doing work/chores  Somewhat difficult      Most Recent Anxiety Screen:     09/23/2022    3:50 PM  GAD 7 : Generalized Anxiety Score  Nervous, Anxious, on Edge 3  Control/stop worrying 3  Worry too much - different things 3  Trouble relaxing 3  Restless 2  Easily annoyed or irritable 1  Afraid - awful might happen 3  Total GAD 7 Score 18  Anxiety Difficulty Very difficult   Most Recent Fall Screen:    11/07/2022   11:07 AM 09/20/2022    2:05 PM 07/04/2022    1:54 PM 09/10/2021    1:53 PM 04/25/2021   10:49 AM  Fall Risk   Falls in the past year? 0 0 0 0 0  Number falls in  past yr: 0 0 0 0 0  Injury with Fall? 0 0 0 0 0  Risk for fall due to : No Fall Risks Medication side effect No Fall Risks History of fall(s) History of fall(s)  Follow up Falls evaluation completed Falls prevention discussed;Education provided;Falls evaluation completed Falls evaluation completed Falls prevention discussed;Falls evaluation completed Falls evaluation completed    Past medical history, surgical history, medications, allergies, family history and social history reviewed with patient today and changes made to appropriate areas of the chart.  Past Medical History:  Past Medical History:  Diagnosis Date   Adhesive capsulitis    BPPV (benign paroxysmal positional vertigo)    Breast cancer ([facility] 2008   Endometriosis    Fibromyalgia    GERD (gastroesophageal reflux disease)    Hyperlipidemia    Lymphedema    Malaria 1996   Personal history of radiation therapy 2008   PONV (postoperative nausea and vomiting)    Shingles 2002   Medications:  Current  Outpatient Medications on File Prior to Visit  Medication Sig   acetaminophen (TYLENOL) 500 MG tablet Take 500 mg by mouth every 6 (six) hours as needed.   ALPRAZolam (XANAX) 0.25 MG tablet Take 1-2 tablets (0.25-0.5 mg total) by mouth 3 (three) times daily as needed for anxiety.   atorvastatin (LIPITOR) 10 MG tablet Take 10 mg by mouth daily.   cetirizine (ZYRTEC) 10 MG tablet Take 10 mg by mouth daily.   Cholecalciferol (VITAMIN D) 125 MCG (5000 UT) CAPS Take 1 capsule by mouth daily.   fluticasone (FLONASE) 50 MCG/ACT nasal spray Place 2 sprays into both nostrils daily.   No current facility-administered medications on file prior to visit.   Surgical History:  Past Surgical History:  Procedure Laterality Date   BILATERAL SALPINGOOPHORECTOMY     BREAST LUMPECTOMY Right 2008   with right axillary node resection   CHOLECYSTECTOMY     LAPAROSCOPY     MASS EXCISION Right 04/30/2018   Procedure: EXCISION CYST DEBRIDEMENT  INTERPHALANGEAL JOINT RIGHT THUMB;  Surgeon: Daryll Brod, MD;  Location: [facility] [place] [facility];  Service: Orthopedics;  Laterality: Right;   TONSILLECTOMY     TOTAL KNEE ARTHROPLASTY Bilateral 07/2018, 10/2018   VAGINAL HYSTERECTOMY     Allergies:  Allergies  Allergen Reactions   Darvon Itching and Nausea Only   Family History:  Family History  Problem Relation Age of Onset   Cancer Mother    Rectal cancer Mother    Stroke Mother    Heart disease Father    Heart attack Father    Crohn's disease Maternal Uncle    Liver disease Neg Hx    Colon polyps Neg Hx    Breast cancer Neg Hx        Objective:    BP 120/80   Pulse 83   Temp 98.3 F (36.8 C)   Ht 5' 5.5" (1.664 m)   Wt 168 lb 9.6 oz (76.5 kg)   BMI 27.63 kg/m   Wt Readings from Last 3 Encounters:  11/07/22 168 lb 9.6 oz (76.5 kg)  11/01/22 166 lb 12.8 oz (75.7 kg)  09/23/22 168 lb 9.6 oz (76.5 kg)    Physical Exam Vitals and nursing note reviewed.  Constitutional:      General: She is not in acute distress.    Appearance: Normal appearance.  HENT:     Head: Normocephalic and atraumatic.     Right Ear: Hearing, tympanic membrane, ear canal and external ear normal.     Left Ear: Hearing, tympanic membrane, ear canal and external ear normal.     Nose: Nose normal.     Right Sinus: No maxillary sinus tenderness or frontal sinus tenderness.     Left Sinus: No maxillary sinus tenderness or frontal sinus tenderness.     Mouth/Throat:     Lips: Pink.     Mouth: Mucous membranes are moist.     Pharynx: Oropharynx is clear.  Eyes:     General: Lids are normal. Vision grossly intact.     Extraocular Movements: Extraocular movements intact.     Conjunctiva/sclera: Conjunctivae normal.     Pupils: Pupils are equal, round, and reactive to light.     Funduscopic exam:    Right eye: Red reflex present.        Left eye: Red reflex present.    Visual Fields: Right eye visual fields normal and left eye visual fields  normal.  Neck:     Thyroid: No thyromegaly.  Vascular: No carotid bruit.  Cardiovascular:     Rate and Rhythm: Normal rate and regular rhythm.     Chest Wall: PMI is not displaced.     Pulses: Normal pulses.          Dorsalis pedis pulses are 2+ on the right side and 2+ on the left side.       Posterior tibial pulses are 2+ on the right side and 2+ on the left side.     Heart sounds: Normal heart sounds. No murmur heard. Pulmonary:     Effort: Pulmonary effort is normal. No respiratory distress.     Breath sounds: Normal breath sounds.  Abdominal:     General: Abdomen is flat. Bowel sounds are normal. There is no distension.     Palpations: Abdomen is soft. There is no hepatomegaly, splenomegaly or mass.     Tenderness: There is no abdominal tenderness. There is no right CVA tenderness, left CVA tenderness, guarding or rebound.  Musculoskeletal:        General: Normal range of motion.     Cervical back: Full passive range of motion without pain, normal range of motion and neck supple. No tenderness.     Right lower leg: No edema.     Left lower leg: No edema.  Feet:     Left foot:     Toenail Condition: Left toenails are normal.  Lymphadenopathy:     Cervical: No cervical adenopathy.     Upper Body:     Right upper body: No supraclavicular adenopathy.     Left upper body: No supraclavicular adenopathy.  Skin:    General: Skin is warm and dry.     Capillary Refill: Capillary refill takes less than 2 seconds.     Nails: There is no clubbing.  Neurological:     General: No focal deficit present.     Mental Status: She is alert and oriented to person, place, and time.     GCS: GCS eye subscore is 4. GCS verbal subscore is 5. GCS motor subscore is 6.     Sensory: Sensation is intact.     Motor: Motor function is intact.     Coordination: Coordination is intact.     Gait: Gait is intact.     Deep Tendon Reflexes: Reflexes are normal and symmetric.  Psychiatric:         Attention and Perception: Attention normal.        Mood and Affect: Mood normal.        Speech: Speech normal.        Behavior: Behavior normal. Behavior is cooperative.        Thought Content: Thought content normal.        Cognition and Memory: Cognition and memory normal.        Judgment: Judgment normal.     Results for orders placed or performed in visit on 11/01/22  T4, free  Result Value Ref Range   Free T4 1.18 0.82 - 1.77 ng/dL  TSH  Result Value Ref Range   TSH 1.040 0.450 - 4.500 uIU/mL  CBC with Differential/Platelet  Result Value Ref Range   WBC 5.5 3.4 - 10.8 x10E3/uL   RBC 4.68 3.77 - 5.28 x10E6/uL   Hemoglobin 13.7 11.1 - 15.9 g/dL   Hematocrit 41.4 34.0 - 46.6 %   MCV 89 79 - 97 fL   MCH 29.3 26.6 - 33.0 pg   MCHC 33.1 31.5 - 35.7 g/dL  RDW 12.9 11.7 - 15.4 %   Platelets 303 150 - 450 x10E3/uL   Neutrophils 70 Not Estab. %   Lymphs 20 Not Estab. %   Monocytes 6 Not Estab. %   Eos 2 Not Estab. %   Basos 2 Not Estab. %   Neutrophils Absolute 3.8 1.4 - 7.0 x10E3/uL   Lymphocytes Absolute 1.1 0.7 - 3.1 x10E3/uL   Monocytes Absolute 0.4 0.1 - 0.9 x10E3/uL   EOS (ABSOLUTE) 0.1 0.0 - 0.4 x10E3/uL   Basophils Absolute 0.1 0.0 - 0.2 x10E3/uL   Immature Granulocytes 0 Not Estab. %   Immature Grans (Abs) 0.0 0.0 - 0.1 x10E3/uL  Comprehensive metabolic panel  Result Value Ref Range   Glucose 90 70 - 99 mg/dL   BUN 12 8 - 27 mg/dL   Creatinine, Ser 0.74 0.57 - 1.00 mg/dL   eGFR 89 >59 mL/min/1.73   BUN/Creatinine Ratio 16 12 - 28   Sodium 140 134 - 144 mmol/L   Potassium 4.5 3.5 - 5.2 mmol/L   Chloride 101 96 - 106 mmol/L   CO2 25 20 - 29 mmol/L   Calcium 9.4 8.7 - 10.3 mg/dL   Total Protein 6.6 6.0 - 8.5 g/dL   Albumin 4.6 3.9 - 4.9 g/dL   Globulin, Total 2.0 1.5 - 4.5 g/dL   Albumin/Globulin Ratio 2.3 (H) 1.2 - 2.2   Bilirubin Total 0.5 0.0 - 1.2 mg/dL   Alkaline Phosphatase 67 44 - 121 IU/L   AST 22 0 - 40 IU/L   ALT 16 0 - 32 IU/L  Lipid panel   Result Value Ref Range   Cholesterol, Total 237 (H) 100 - 199 mg/dL   Triglycerides 75 0 - 149 mg/dL   HDL 89 >39 mg/dL   VLDL Cholesterol Cal 13 5 - 40 mg/dL   LDL Chol Calc (NIH) 135 (H) 0 - 99 mg/dL   Chol/HDL Ratio 2.7 0.0 - 4.4 ratio    IMMUNIZATIONS:   Flu: Flu vaccine declined, patient will complete later Prevnar 13: Prevnar 13 declined, patient will complete at a later date Pneumovax 23: Pneumovax declined, patient will complete at a later date Vac Shingrix: Shingrix completed, documentation in chart (Dose # 2/2) HPV: HPV N/A for this patient Tetanus: Tetanus completed in the last 10 years  HEALTH MAINTENANCE: Pap Smear HM Status: is not applicable for this patient Mammogram HM Status: is up to date Colon Cancer Screening HM Status: is up to date Bone Density HM Status: is up to date STI Testing HM Status: is not applicable for this patient  Eye Exam HM Status: is up to date Urine Micro HM Status: is not applicable for this patient  Spirometry HM Status: is not applicable for this patient      Assessment & Plan:   Problem List Items Addressed This Visit     Encntr for general adult medical exam w/o abnormal findings - Primary    CPE today with no abnormalities noted on exam.  Labs pending. Will make changes as necessary based on results.  Review of HM activities and recommendations discussed and provided on AVS Anticipatory guidance, diet, and exercise recommendations provided.  Medications, allergies, and hx reviewed and updated as necessary.  Plan to f/u with CPE in 1 year or sooner for acute/chronic health needs as directed.        Anxiety    Symptoms and presentation consistent with difficulty sleeping related to anxiety.  We did discuss the option of utilizing hydroxyzine to  see if this is helpful for her sleep management.  We also discussed the option of trazodone.  At this time we will send in both treatment options, however, patient is aware to start with  hydroxyzine and if this is not effective changed to trazodone but not take both medications at the same time.  Plan to follow-up months to see how she is doing on the medications.  If these are not helpful we will plan to look at other options.      Relevant Medications   traZODone (DESYREL) 50 MG tablet   hydrOXYzine (ATARAX) 50 MG tablet       Follow up plan: Return in about 1 year (around 11/08/2023) for CPE .  NEXT PREVENTATIVE PHYSICAL DUE IN 1 YEAR.  PATIENT COUNSELING PROVIDED FOR ALL ADULT PATIENTS:  Consume a well balanced diet low in saturated fats, cholesterol, and moderation in carbohydrates.   This can be as simple as monitoring portion sizes and cutting back on sugary beverages such as soda and juice to start with.    Daily water consumption of at least 64 ounces.  Physical activity at least 180 minutes per week, if just starting out.   This can be as simple as taking the stairs instead of the elevator and walking 2-3 laps around the office  purposefully every day.   STD protection, partner selection, and regular testing if high risk.  Limited consumption of alcoholic beverages if alcohol is consumed.  For women, I recommend no more than 7 alcoholic beverages per week, spread out throughout the week.  Avoid "binge" drinking or consuming large quantities of alcohol in one setting.   Please let me know if you feel you may need help with reduction or quitting alcohol consumption.   Avoidance of nicotine, if used.  Please let me know if you feel you may need help with reduction or quitting nicotine use.   Daily mental health attention.  This can be in the form of 5 minute daily meditation, prayer, journaling, yoga, reflection, etc.   Purposeful attention to your emotions and mental state can significantly improve your overall wellbeing and Health.  Please know that I am here to help you with all of your health care goals and am happy to work with you to find a solution  that works best for you.  The greatest advice I have received with any changes in life are to take it one step at a time, that even means if all you can focus on is the next 60 seconds, then do that and celebrate your victories.  With any changes in life, you will have set backs, and that is OK. The important thing to remember is, if you have a set back, it is not a failure, it is an opportunity to try again!  Health Maintenance Recommendations Screening Testing Mammogram Every 1 -2 years based on history and risk factors Starting at age 18 Pap Smear Ages 21-39 every 3 years Ages 81-65 every 5 years with HPV testing More frequent testing may be required based on results and history Colon Cancer Screening Every 1-10 years based on test performed, risk factors, and history Starting at age 74 Bone Density Screening Every 2-10 years based on history Starting at age 66 for women Recommendations for men differ based on medication usage, history, and risk factors AAA Screening One time ultrasound Men 6-64 years old who have every smoked Lung Cancer Screening Low Dose Lung CT every 12 months Age 64-80 years  with a 30 pack-year smoking history who still smoke or who have quit within the last 15 years  Screening Labs Routine  Labs: Complete Blood Count (CBC), Complete Metabolic Panel (CMP), Cholesterol (Lipid Panel) Every 6-12 months based on history and medications May be recommended more frequently based on current conditions or previous results Hemoglobin A1c Lab Every 3-12 months based on history and previous results Starting at age 48 or earlier with diagnosis of diabetes, high cholesterol, BMI >26, and/or risk factors Frequent monitoring for patients with diabetes to ensure blood sugar control Thyroid Panel (TSH w/ T3 & T4) Every 6 months based on history, symptoms, and risk factors May be repeated more often if on medication HIV One time testing for all patients 74 and older May  be repeated more frequently for patients with increased risk factors or exposure Hepatitis C One time testing for all patients 65 and older May be repeated more frequently for patients with increased risk factors or exposure Gonorrhea, Chlamydia Every 12 months for all sexually active persons 13-24 years Additional monitoring may be recommended for those who are considered high risk or who have symptoms PSA Men 77-69 years old with risk factors Additional screening may be recommended from age 1-69 based on risk factors, symptoms, and history  Vaccine Recommendations Tetanus Booster All adults every 10 years Flu Vaccine All patients 6 months and older every year COVID Vaccine All patients 12 years and older Initial dosing with booster May recommend additional booster based on age and health history HPV Vaccine 2 doses all patients age 88-26 Dosing may be considered for patients over 26 Shingles Vaccine (Shingrix) 2 doses all adults 93 years and older Pneumonia (Pneumovax 23) All adults 76 years and older May recommend earlier dosing based on health history Pneumonia (Prevnar 28) All adults 33 years and older Dosed 1 year after Pneumovax 23  Additional Screening, Testing, and Vaccinations may be recommended on an individualized basis based on family history, health history, risk factors, and/or exposure.

## 2022-11-07 NOTE — Patient Instructions (Signed)
You look fabulous today! Your labs are great!  If you have any issues please let me know.

## 2022-11-14 ENCOUNTER — Ambulatory Visit: Payer: Medicare Other

## 2022-11-14 DIAGNOSIS — E785 Hyperlipidemia, unspecified: Secondary | ICD-10-CM | POA: Insufficient documentation

## 2022-11-14 DIAGNOSIS — K589 Irritable bowel syndrome without diarrhea: Secondary | ICD-10-CM | POA: Insufficient documentation

## 2022-11-14 DIAGNOSIS — R0989 Other specified symptoms and signs involving the circulatory and respiratory systems: Secondary | ICD-10-CM | POA: Insufficient documentation

## 2022-11-14 DIAGNOSIS — M1990 Unspecified osteoarthritis, unspecified site: Secondary | ICD-10-CM | POA: Insufficient documentation

## 2022-11-14 NOTE — Assessment & Plan Note (Signed)
Sensation of globus of unknown etiology. On exam thyroid palpable and does appear full. Given her symptoms, I do feel that ultrasound of the neck and thyroid is a reasonable next step to help determine the cause of the symptoms. A barium swallow may be required if this is not informative. We will obtain labs today for future AWV and thyroid monitoring and ultrasound ordered. Will make changes to plan to care based on findings.

## 2022-11-15 ENCOUNTER — Other Ambulatory Visit: Payer: Medicare Other

## 2022-11-18 ENCOUNTER — Other Ambulatory Visit: Payer: Self-pay | Admitting: Medical

## 2022-11-18 ENCOUNTER — Other Ambulatory Visit: Payer: Medicare Other

## 2022-11-19 ENCOUNTER — Ambulatory Visit
Admission: RE | Admit: 2022-11-19 | Discharge: 2022-11-19 | Disposition: A | Discharge status: Home / Self Care | Payer: Medicare Other | Source: Ambulatory Visit | Attending: Nurse Practitioner | Admitting: Nurse Practitioner

## 2022-11-19 DIAGNOSIS — R0989 Other specified symptoms and signs involving the circulatory and respiratory systems: Secondary | ICD-10-CM

## 2022-11-20 ENCOUNTER — Ambulatory Visit: Payer: Medicare Other

## 2022-11-21 ENCOUNTER — Ambulatory Visit
Admission: RE | Admit: 2022-11-21 | Discharge: 2022-11-21 | Disposition: A | Discharge status: Home / Self Care | Payer: Medicare Other | Source: Ambulatory Visit | Attending: Obstetrics and Gynecology | Admitting: Obstetrics and Gynecology

## 2022-11-21 DIAGNOSIS — Z1231 Encounter for screening mammogram for malignant neoplasm of breast: Secondary | ICD-10-CM

## 2022-11-26 DIAGNOSIS — Z0000 Encounter for general adult medical examination without abnormal findings: Secondary | ICD-10-CM | POA: Insufficient documentation

## 2022-11-26 DIAGNOSIS — F419 Anxiety disorder, unspecified: Secondary | ICD-10-CM | POA: Insufficient documentation

## 2022-11-26 NOTE — Assessment & Plan Note (Signed)

## 2022-11-26 NOTE — Assessment & Plan Note (Signed)
Symptoms and presentation consistent with difficulty sleeping related to anxiety.  We did discuss the option of utilizing hydroxyzine to see if this is helpful for her sleep management.  We also discussed the option of trazodone.  At this time we will send in both treatment options, however, patient is aware to start with hydroxyzine and if this is not effective changed to trazodone but not take both medications at the same time.  Plan to follow-up months to see how she is doing on the medications.  If these are not helpful we will plan to look at other options.

## 2022-11-27 ENCOUNTER — Other Ambulatory Visit: Payer: Self-pay | Admitting: Obstetrics and Gynecology

## 2022-11-27 DIAGNOSIS — R928 Other abnormal and inconclusive findings on diagnostic imaging of breast: Secondary | ICD-10-CM

## 2022-12-05 ENCOUNTER — Telehealth: Payer: Self-pay | Admitting: Family Medicine

## 2022-12-05 ENCOUNTER — Other Ambulatory Visit: Payer: Self-pay | Admitting: Nurse Practitioner

## 2022-12-05 DIAGNOSIS — E041 Nontoxic single thyroid nodule: Secondary | ICD-10-CM

## 2022-12-05 NOTE — Telephone Encounter (Signed)
Please call [name] to let her know that the referral has been placed and she should hear from [facility] endocrinology to schedule this.

## 2022-12-05 NOTE — Telephone Encounter (Signed)
Pt called and states she is waiting on Needle Biopsy order.Please advise pt when done.  [phone]

## 2022-12-06 ENCOUNTER — Telehealth: Payer: Self-pay | Admitting: Nurse Practitioner

## 2022-12-06 NOTE — Telephone Encounter (Signed)
Savannah [name] called in she was upset because the referral for her needle biopsy can't see her for 3 months and she wants it to be done sooner than that. She says she knows [facility] will be able to do it sooner if you can refer her there or if you know somewhere that can get her in sooner.

## 2022-12-06 NOTE — Telephone Encounter (Signed)
[facility] is scheduling into September. Unless patient prefers [facility] I'll send to [place] so she is not waiting for months.

## 2022-12-06 NOTE — Telephone Encounter (Signed)
Endocrinology Referral

## 2022-12-09 ENCOUNTER — Other Ambulatory Visit: Payer: Self-pay

## 2022-12-09 DIAGNOSIS — E041 Nontoxic single thyroid nodule: Secondary | ICD-10-CM

## 2022-12-11 ENCOUNTER — Ambulatory Visit
Admission: RE | Admit: 2022-12-11 | Discharge: 2022-12-11 | Disposition: A | Discharge status: Home / Self Care | Payer: Medicare Other | Source: Ambulatory Visit | Attending: Obstetrics and Gynecology | Admitting: Obstetrics and Gynecology

## 2022-12-11 DIAGNOSIS — R928 Other abnormal and inconclusive findings on diagnostic imaging of breast: Secondary | ICD-10-CM

## 2022-12-13 ENCOUNTER — Other Ambulatory Visit: Payer: Self-pay | Admitting: Obstetrics and Gynecology

## 2022-12-13 DIAGNOSIS — R921 Mammographic calcification found on diagnostic imaging of breast: Secondary | ICD-10-CM

## 2022-12-17 ENCOUNTER — Ambulatory Visit
Admission: RE | Admit: 2022-12-17 | Discharge: 2022-12-17 | Disposition: A | Discharge status: Home / Self Care | Payer: Medicare Other | Source: Ambulatory Visit | Attending: Nurse Practitioner | Admitting: Nurse Practitioner

## 2022-12-17 ENCOUNTER — Other Ambulatory Visit (HOSPITAL_COMMUNITY)
Admission: RE | Admit: 2022-12-17 | Discharge: 2022-12-17 | Disposition: A | Discharge status: Home / Self Care | Payer: Medicare Other | Source: Ambulatory Visit | Attending: Nurse Practitioner | Admitting: Nurse Practitioner

## 2022-12-17 DIAGNOSIS — E041 Nontoxic single thyroid nodule: Secondary | ICD-10-CM | POA: Insufficient documentation

## 2022-12-25 LAB — CYTOLOGY - NON PAP

## 2022-12-26 ENCOUNTER — Telehealth: Payer: Self-pay | Admitting: Nurse Practitioner

## 2022-12-26 NOTE — Telephone Encounter (Signed)
Pt would like refill Atorvastatin to new pharmacy [facility]

## 2022-12-27 ENCOUNTER — Ambulatory Visit
Admission: RE | Admit: 2022-12-27 | Discharge: 2022-12-27 | Disposition: A | Discharge status: Home / Self Care | Payer: Medicare Other | Source: Ambulatory Visit | Attending: Obstetrics and Gynecology | Admitting: Obstetrics and Gynecology

## 2022-12-27 DIAGNOSIS — R921 Mammographic calcification found on diagnostic imaging of breast: Secondary | ICD-10-CM

## 2022-12-27 HISTORY — PX: BREAST BIOPSY: SHX20

## 2022-12-30 ENCOUNTER — Other Ambulatory Visit: Payer: Self-pay

## 2022-12-30 MED ORDER — ATORVASTATIN CALCIUM 10 MG PO TABS: 10.0000 mg | ORAL_TABLET | Freq: Every day | ORAL | 1 refills | Status: DC

## 2022-12-30 NOTE — Telephone Encounter (Signed)
done

## 2023-01-03 ENCOUNTER — Ambulatory Visit: Payer: Self-pay | Admitting: Surgery

## 2023-01-03 ENCOUNTER — Telehealth: Payer: Self-pay | Admitting: Nurse Practitioner

## 2023-01-03 DIAGNOSIS — D0512 Intraductal carcinoma in situ of left breast: Secondary | ICD-10-CM

## 2023-01-03 NOTE — Telephone Encounter (Signed)
Received a call from pt and she states that she recently had 2 biopsies and she is requesting something for pain. She states that occasionally Savannah [name] occasionally gives her tramadol. She is asking that be sent in for her to [facility]. Pt can be reached at [phone].

## 2023-01-07 ENCOUNTER — Other Ambulatory Visit: Payer: Self-pay | Admitting: *Deleted

## 2023-01-07 ENCOUNTER — Telehealth: Payer: Self-pay | Admitting: Hematology and Oncology

## 2023-01-07 ENCOUNTER — Other Ambulatory Visit: Payer: Self-pay | Admitting: Surgery

## 2023-01-07 DIAGNOSIS — D0512 Intraductal carcinoma in situ of left breast: Secondary | ICD-10-CM

## 2023-01-07 NOTE — Telephone Encounter (Signed)
Scheduled appt per 2/6 referral. Pt is aware of appt date and time. Pt is aware to arrive 15 mins prior to appt time and to bring and updated insurance card. Pt is aware of appt location.

## 2023-01-10 ENCOUNTER — Encounter: Payer: Self-pay | Admitting: *Deleted

## 2023-01-14 NOTE — Progress Notes (Signed)
Radiation Oncology         ([phone]) [phone] ________________________________  Initial Outpatient Consultation  Name: Savannah [name] MRN: [id number]  Date: 01/15/2023  DOB: [birth year]  [id number], Coralee Pesa, NP  Erroll Luna, MD   REFERRING PHYSICIAN: Erroll Luna, MD  DIAGNOSIS: There were no encounter diagnoses.  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 female who is accompanied by ***. she is seen as a courtesy of Dr. Brantley Stage for an opinion concerning radiation therapy as part of management for her recently diagnosed left breast DCIS.   The patient presented for a routine bilateral screening mammogram on 11/21/22 which showed possible calcifications within the left breast. Diagnostic left breast mammogram on 12/11/22 further revealed an indeterminate group of calcifications in the upper outer left breast spanning an area measuring approximately 5.5 cm.   Biopsy of the upper outer left breast on 12/27/22 showed intermediate grade DCIS measuring 0.4 cm in the greatest linear extent of the sample, with necrosis and calcifications. Biopsy of the lower outer left breast also collected showed intermediate to high-grade DCIS measuring 1.1 cm in the greatest linear extent of the sample, with necrosis and calcifications. Prognostic indicators significant for: estrogen receptor 100% positive and progesterone receptor 40% positive, both with strong staining intensity; Her2 not assessed.   The patient was accordingly referred to Dr. Brantley Stage and she has opted to proceed with breast conserving surgery. Her procedure has been scheduled for 01/28/23. The patient will also meet with Dr. Lindi Adie on 01/16/23 to discuss antiestrogen treatment options.    PREVIOUS RADIATION THERAPY: No  PAST MEDICAL HISTORY:  Past Medical History:  Diagnosis Date   Adhesive capsulitis     BPPV (benign paroxysmal positional vertigo)    Breast cancer ([place]) 2008   Endometriosis    Fibromyalgia    GERD (gastroesophageal reflux disease)    Hyperlipidemia    Lymphedema    Malaria 1996   Personal history of radiation therapy 2008   PONV (postoperative nausea and vomiting)    Shingles 2002    PAST SURGICAL HISTORY: Past Surgical History:  Procedure Laterality Date   BILATERAL SALPINGOOPHORECTOMY     BREAST BIOPSY Left 12/27/2022   MM LT BREAST BX W LOC DEV 1ST LESION IMAGE BX SPEC STEREO GUIDE 12/27/2022 [facility]   BREAST BIOPSY Left 12/27/2022   MM LT BREAST BX W LOC DEV EA AD LESION IMG BX SPEC STEREO GUIDE 12/27/2022 [facility]   BREAST LUMPECTOMY Right 2008   with right axillary node resection   CHOLECYSTECTOMY     LAPAROSCOPY     MASS EXCISION Right 04/30/2018   Procedure: EXCISION CYST DEBRIDEMENT INTERPHALANGEAL JOINT RIGHT THUMB;  Surgeon: Daryll Brod, MD;  Location: [facility];  Service: Orthopedics;  Laterality: Right;   TONSILLECTOMY     TOTAL KNEE ARTHROPLASTY Bilateral 07/2018, 10/2018   VAGINAL HYSTERECTOMY      FAMILY HISTORY:  Family History  Problem Relation Age of Onset   Cancer Mother    Rectal cancer Mother    Stroke Mother    Heart disease Father    Heart attack Father    Crohn's disease Maternal Uncle    Liver disease Neg Hx    Colon polyps Neg Hx    Breast cancer Neg Hx     SOCIAL HISTORY:  Social History   Tobacco Use  Smoking status: Never   Smokeless tobacco: Never  Vaping Use   Vaping Use: Never used  Substance Use Topics   Alcohol use: Yes    Comment: 2 glasses of wine with dinner, 3-4x/week   Drug use: Never    ALLERGIES:  Allergies  Allergen Reactions   Darvon Itching and Nausea Only    MEDICATIONS:  Current Outpatient Medications  Medication Sig Dispense Refill   ALPRAZolam (XANAX) 0.25 MG tablet Take 1-2 tablets (0.25-0.5 mg total) by mouth 3 (three) times daily as needed for  anxiety. 20 tablet 0   atorvastatin (LIPITOR) 10 MG tablet Take 1 tablet (10 mg total) by mouth daily. 90 tablet 1   cetirizine (ZYRTEC) 10 MG tablet Take 10 mg by mouth daily.     Cholecalciferol (VITAMIN D) 125 MCG (5000 UT) CAPS Take 1 capsule by mouth daily.     fluticasone (FLONASE) 50 MCG/ACT nasal spray Place 2 sprays into both nostrils daily.     hydrOXYzine (ATARAX) 50 MG tablet Take 1 tablet (50 mg total) by mouth at bedtime as needed. For anxiety and sleep. 30 tablet 3   traZODone (DESYREL) 50 MG tablet Take 1 tablet (50 mg total) by mouth at bedtime. 90 tablet 3   No current facility-administered medications for this encounter.    REVIEW OF SYSTEMS:  A 10+ POINT REVIEW OF SYSTEMS WAS OBTAINED including neurology, dermatology, psychiatry, cardiac, respiratory, lymph, extremities, GI, GU, musculoskeletal, constitutional, reproductive, HEENT. ***   PHYSICAL EXAM:  vitals were not taken for this visit.   General: Alert and oriented, in no acute distress HEENT: Head is normocephalic. Extraocular movements are intact. Oropharynx is clear. Neck: Neck is supple, no palpable cervical or supraclavicular lymphadenopathy. Heart: Regular in rate and rhythm with no murmurs, rubs, or gallops. Chest: Clear to auscultation bilaterally, with no rhonchi, wheezes, or rales. Abdomen: Soft, nontender, nondistended, with no rigidity or guarding. Extremities: No cyanosis or edema. Lymphatics: see Neck Exam Skin: No concerning lesions. Musculoskeletal: symmetric strength and muscle tone throughout. Neurologic: Cranial nerves II through XII are grossly intact. No obvious focalities. Speech is fluent. Coordination is intact. Psychiatric: Judgment and insight are intact. Affect is appropriate.  Right Breast: no palpable mass, nipple discharge or bleeding. Left Breast: ***  ECOG = ***  0 - Asymptomatic (Fully active, able to carry on all predisease activities without restriction)  1 - Symptomatic  but completely ambulatory (Restricted in physically strenuous activity but ambulatory and able to carry out work of a light or sedentary nature. For example, light housework, office work)  2 - Symptomatic, <50% in bed during the day (Ambulatory and capable of all self care but unable to carry out any work activities. Up and about more than 50% of waking hours)  3 - Symptomatic, >50% in bed, but not bedbound (Capable of only limited self-care, confined to bed or chair 50% or more of waking hours)  4 - Bedbound (Completely disabled. Cannot carry on any self-care. Totally confined to bed or chair)  5 - Death   Eustace Pen MM, Creech RH, Tormey DC, et al. [phone]). "Toxicity and response criteria of the [facility]". [address] Oncol. 5 (6): 649-55  LABORATORY DATA:  Lab Results  Component Value Date   WBC 5.5 11/01/2022   HGB 13.7 11/01/2022   HCT 41.4 11/01/2022   MCV 89 11/01/2022   PLT 303 11/01/2022   NEUTROABS 3.8 11/01/2022   Lab Results  Component Value Date   NA 140  11/01/2022   K 4.5 11/01/2022   CL 101 11/01/2022   CO2 25 11/01/2022   GLUCOSE 90 11/01/2022   BUN 12 11/01/2022   CREATININE 0.74 11/01/2022   CALCIUM 9.4 11/01/2022      RADIOGRAPHY: MM LT BREAST BX W LOC DEV EA AD LESION IMG BX SPEC STEREO GUIDE  Addendum Date: 01/01/2023   ADDENDUM REPORT: 01/01/2023 14:58 ADDENDUM: Pathology revealed 1. Breast, left, needle core biopsy, upper outer, (X clip) DUCTAL CARCINOMA IN SITU, CRIBRIFORM, INTERMEDIATE NUCLEAR GRADE, NECROSIS: PRESENT, CALCIFICATIONS: PRESENT. This was found to be concordant by Dr. Lovey Newcomer. Pathology revealed 2. Breast, left, needle core biopsy, lower outer (coil clip) DUCTAL CARCINOMA IN SITU, CRIBRIFORM, INTERMEDIATE TO HIGH NUCLEAR GRADE, NECROSIS: PRESENT, CALCIFICATIONS: PRESENT. This was found to be concordant by Dr. Lovey Newcomer. Pathology results were discussed with the patient by telephone. The patient reported doing well  after the biopsies with tenderness and bruising at the sites. Post biopsy instructions and care were reviewed and questions were answered. The patient was encouraged to call [facility] for any additional concerns. Surgical consultation has been arranged with Dr. Erroll Luna at [facility] on January 03, 2023. Pathology results reported by Stacie Acres RN on 12/31/2022. Electronically Signed   By: Lovey Newcomer M.D.   On: 01/01/2023 14:58   Result Date: 01/01/2023 CLINICAL DATA:  Patient with indeterminate calcifications. EXAM: LEFT BREAST STEREOTACTIC CORE NEEDLE BIOPSY COMPARISON:  Previous exam(s). FINDINGS: The patient and I discussed the procedure of stereotactic-guided biopsy including benefits and alternatives. We discussed the high likelihood of a successful procedure. We discussed the risks of the procedure including infection, bleeding, tissue injury, clip migration, and inadequate sampling. Informed written consent was given. The usual time out protocol was performed immediately prior to the procedure. Site 1: Upper-outer left breast Using sterile technique and 1% Lidocaine as local anesthetic, under stereotactic guidance, a 9 gauge vacuum assisted device was used to perform core needle biopsy of calcifications upper-outer left breast using a lateral approach. Specimen radiograph was performed showing calcifications. Specimens with calcifications are identified for pathology. Lesion quadrant: Upper outer quadrant At the conclusion of the procedure, X shaped tissue marker clip was deployed into the biopsy cavity. Follow-up 2-view mammogram was performed and dictated separately. Site 2: Lower outer left breast Using sterile technique and 1% Lidocaine as local anesthetic, under stereotactic guidance, a 9 gauge vacuum assisted device was used to perform core needle biopsy of calcifications lower outer left breast using a lateral approach. Specimen radiograph was  performed showing calcifications. Specimens with calcifications are identified for pathology. Lesion quadrant: Lower outer quadrant At the conclusion of the procedure, coil shaped tissue marker clip was deployed into the biopsy cavity. Follow-up 2-view mammogram was performed and dictated separately. IMPRESSION: Stereotactic-guided biopsy of left breast calcifications. No apparent complications. Electronically Signed: By: Lovey Newcomer M.D. On: 12/27/2022 11:10  MM LT BREAST BX W LOC DEV 1ST LESION IMAGE BX SPEC STEREO GUIDE  Addendum Date: 01/01/2023   ADDENDUM REPORT: 01/01/2023 14:58 ADDENDUM: Pathology revealed 1. Breast, left, needle core biopsy, upper outer, (X clip) DUCTAL CARCINOMA IN SITU, CRIBRIFORM, INTERMEDIATE NUCLEAR GRADE, NECROSIS: PRESENT, CALCIFICATIONS: PRESENT. This was found to be concordant by Dr. Lovey Newcomer. Pathology revealed 2. Breast, left, needle core biopsy, lower outer (coil clip) DUCTAL CARCINOMA IN SITU, CRIBRIFORM, INTERMEDIATE TO HIGH NUCLEAR GRADE, NECROSIS: PRESENT, CALCIFICATIONS: PRESENT. This was found to be concordant by Dr. Lovey Newcomer. Pathology results were discussed with the  patient by telephone. The patient reported doing well after the biopsies with tenderness and bruising at the sites. Post biopsy instructions and care were reviewed and questions were answered. The patient was encouraged to call [facility] for any additional concerns. Surgical consultation has been arranged with Dr. Erroll Luna at [facility] on January 03, 2023. Pathology results reported by Stacie Acres RN on 12/31/2022. Electronically Signed   By: Lovey Newcomer M.D.   On: 01/01/2023 14:58   Result Date: 01/01/2023 CLINICAL DATA:  Patient with indeterminate calcifications. EXAM: LEFT BREAST STEREOTACTIC CORE NEEDLE BIOPSY COMPARISON:  Previous exam(s). FINDINGS: The patient and I discussed the procedure of stereotactic-guided biopsy including benefits and  alternatives. We discussed the high likelihood of a successful procedure. We discussed the risks of the procedure including infection, bleeding, tissue injury, clip migration, and inadequate sampling. Informed written consent was given. The usual time out protocol was performed immediately prior to the procedure. Site 1: Upper-outer left breast Using sterile technique and 1% Lidocaine as local anesthetic, under stereotactic guidance, a 9 gauge vacuum assisted device was used to perform core needle biopsy of calcifications upper-outer left breast using a lateral approach. Specimen radiograph was performed showing calcifications. Specimens with calcifications are identified for pathology. Lesion quadrant: Upper outer quadrant At the conclusion of the procedure, X shaped tissue marker clip was deployed into the biopsy cavity. Follow-up 2-view mammogram was performed and dictated separately. Site 2: Lower outer left breast Using sterile technique and 1% Lidocaine as local anesthetic, under stereotactic guidance, a 9 gauge vacuum assisted device was used to perform core needle biopsy of calcifications lower outer left breast using a lateral approach. Specimen radiograph was performed showing calcifications. Specimens with calcifications are identified for pathology. Lesion quadrant: Lower outer quadrant At the conclusion of the procedure, coil shaped tissue marker clip was deployed into the biopsy cavity. Follow-up 2-view mammogram was performed and dictated separately. IMPRESSION: Stereotactic-guided biopsy of left breast calcifications. No apparent complications. Electronically Signed: By: Lovey Newcomer M.D. On: 12/27/2022 11:10  MM CLIP PLACEMENT LEFT  Result Date: 12/27/2022 CLINICAL DATA:  Patient with indeterminate left breast calcifications EXAM: 3D DIAGNOSTIC LEFT MAMMOGRAM POST STEREOTACTIC BIOPSY COMPARISON:  Previous exam(s). FINDINGS: 3D Mammographic images were obtained following stereotactic guided biopsy  of left breast calcifications. Site 1: Upper-outer left breast: X shaped clip: In appropriate position Site 2: Lower outer left breast: Coil clip: In appropriate position IMPRESSION: Appropriate positioning of the the biopsy marking clips. Final Assessment: Post Procedure Mammograms for Marker Placement Electronically Signed   By: Lovey Newcomer M.D.   On: 12/27/2022 11:12  [place] FNA BX THYROID 1ST LESION AFIRMA  Result Date: 12/18/2022 INDICATION: Indeterminate thyroid nodule EXAM: ULTRASOUND GUIDED FINE NEEDLE ASPIRATION OF INDETERMINATE THYROID NODULE COMPARISON:  None Available. MEDICATIONS: None COMPLICATIONS: None immediate. TECHNIQUE: Informed written consent was obtained from the patient after a discussion of the risks, benefits and alternatives to treatment. Questions regarding the procedure were encouraged and answered. A timeout was performed prior to the initiation of the procedure. Pre-procedural ultrasound scanning demonstrated unchanged size and appearance of the indeterminate nodule within the superior left thyroid lobe. The procedure was planned. The neck was prepped in the usual sterile fashion, and a sterile drape was applied covering the operative field. A timeout was performed prior to the initiation of the procedure. Local anesthesia was provided with 1% lidocaine. Under direct ultrasound guidance, 6 FNA biopsies were performed of the superior left thyroid lobe nodule  with a 25 gauge needle. Multiple ultrasound images were saved for procedural documentation purposes. The samples were prepared and submitted to pathology. Limited post procedural scanning was negative for hematoma or additional complication. Dressings were placed. The patient tolerated the above procedures procedure well without immediate postprocedural complication. FINDINGS: Nodule reference number based on prior diagnostic ultrasound: 2 Maximum size: 1.5 Location: Left; superior ACR TI-RADS risk category: 4 Reason for biopsy:  meets ACR TI-RADS criteria Ultrasound imaging confirms appropriate placement of the needles within the thyroid nodule. IMPRESSION: Technically successful ultrasound guided fine needle aspiration of superior left thyroid nodule. Electronically Signed   By: Miachel Roux M.D.   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  We discussed the natural history of *** and general treatment, highlighting the role of radiotherapy in the management.  We discussed the available radiation techniques, and focused on the details of logistics and delivery.  We reviewed the anticipated acute and late sequelae associated with radiation in this setting.  The patient was encouraged to ask questions that I answered to the best of my ability. *** A patient consent form was discussed and signed.  We retained a copy for our records.  The patient would like to proceed with radiation and will be scheduled for CT simulation.  PLAN: ***    *** minutes of total time was spent for this patient encounter, including preparation, face-to-face counseling with the patient and coordination of care, physical exam, and documentation of the encounter.   ------------------------------------------------  Blair Promise, PhD, MD  This document serves as a record of services personally performed by Gery Pray, MD. It was created on his behalf by Roney Mans, a trained medical scribe. The creation of this record is based on the scribe's personal observations and the provider's statements to them. This document has been checked and approved by the attending provider.

## 2023-01-14 NOTE — Progress Notes (Signed)
Location of Breast Cancer:  Ductal carcinoma in situ of left breast    Histology per Pathology Report:  12/27/2022 1. Breast, left, needle core biopsy, upper outer (X) - DUCTAL CARCINOMA IN SITU, CRIBRIFORM, INTERMEDIATE NUCLEAR GRADE - NECROSIS: PRESENT - CALCIFICATIONS: PRESENT - DCIS LENGTH: 0.4 CM 2. Breast, left, needle core biopsy, lower outer (coil) - DUCTAL CARCINOMA IN SITU, CRIBRIFORM, INTERMEDIATE TO HIGH NUCLEAR GRADE - NECROSIS: PRESENT - CALCIFICATIONS: PRESENT - DCIS LENGTH: 1.1 CM  Receptor Status: ER(100%), PR (40%)  Did patient present with symptoms (if so, please note symptoms) or was this found on screening mammography?: per Dr. Josetta Huddle 01/03/23 office note: "She has developed a cluster of left breast microcalcifications noted on screening mammogram. History of right breast cancer treated in 2008 with breast conserving surgery. She also radiation therapy on the right."  Past/Anticipated interventions by surgeon, if any:  01/28/2023 --Dr. Erroll Luna Scheduled for: LEFT BREAST BRACKETED LUMPECTOMY WITH RADIOACTIVE SEED LOCALIZATION   01/03/2023 --Dr. Erroll Luna (office visit) Discussed breast conserving surgery with possible radiation. Discussed role of sentinel lymph node mapping and DCIS that this can be done at a later time. Reviewed the data on breast conserving surgery versus mastectomy reconstruction as well as given her history of having on the right as well. She is opted for left breast seed localized lumpectomy using 2 seeds and a bracketed approach.The procedure has been discussed with the patient. Alternatives to surgery have been discussed with the patient. Risks of surgery include bleeding, Infection, Seroma formation, death, and the need for further surgery. The patient understands and wishes to proceed.   Past/Anticipated interventions by medical oncology, if any:  Scheduled for consultation with Dr. Nicholas Lose on 01/16/2023  Lymphedema  issues, if any:  Denies    Pain issues, if any:  Continues to deal with soreness and tenderness to biopsy site   SAFETY ISSUES: Prior radiation? Yes--right breast in 2008 Pacemaker/ICD? No Possible current pregnancy? No--hysterectomy Is the patient on methotrexate? No  Current Complaints / other details:  Nothing else of note

## 2023-01-15 ENCOUNTER — Ambulatory Visit
Admission: RE | Admit: 2023-01-15 | Discharge: 2023-01-15 | Disposition: A | Discharge status: Home / Self Care | Payer: Medicare Other | Source: Ambulatory Visit | Attending: Radiation Oncology | Admitting: Radiation Oncology

## 2023-01-15 ENCOUNTER — Other Ambulatory Visit: Payer: Self-pay

## 2023-01-15 ENCOUNTER — Encounter: Payer: Self-pay | Admitting: Radiation Oncology

## 2023-01-15 VITALS — BP 127/69 | HR 70 | Temp 97.7°F | Resp 18 | Ht 66.0 in | Wt 170.2 lb

## 2023-01-15 DIAGNOSIS — E785 Hyperlipidemia, unspecified: Secondary | ICD-10-CM | POA: Diagnosis not present

## 2023-01-15 DIAGNOSIS — Z800 Family history of malignant neoplasm of digestive organs: Secondary | ICD-10-CM | POA: Diagnosis not present

## 2023-01-15 DIAGNOSIS — Z923 Personal history of irradiation: Secondary | ICD-10-CM | POA: Insufficient documentation

## 2023-01-15 DIAGNOSIS — Z170 Estrogen receptor positive status [ER+]: Secondary | ICD-10-CM | POA: Diagnosis not present

## 2023-01-15 DIAGNOSIS — Z79899 Other long term (current) drug therapy: Secondary | ICD-10-CM | POA: Diagnosis not present

## 2023-01-15 DIAGNOSIS — D0512 Intraductal carcinoma in situ of left breast: Secondary | ICD-10-CM | POA: Insufficient documentation

## 2023-01-15 DIAGNOSIS — M797 Fibromyalgia: Secondary | ICD-10-CM | POA: Diagnosis not present

## 2023-01-15 DIAGNOSIS — E041 Nontoxic single thyroid nodule: Secondary | ICD-10-CM | POA: Insufficient documentation

## 2023-01-15 DIAGNOSIS — K219 Gastro-esophageal reflux disease without esophagitis: Secondary | ICD-10-CM | POA: Diagnosis not present

## 2023-01-16 ENCOUNTER — Inpatient Hospital Stay: Payer: Medicare Other | Attending: Hematology and Oncology | Admitting: Hematology and Oncology

## 2023-01-16 ENCOUNTER — Other Ambulatory Visit: Payer: Self-pay

## 2023-01-16 VITALS — BP 153/66 | HR 78 | Temp 97.0°F | Resp 18 | Wt 169.5 lb

## 2023-01-16 DIAGNOSIS — Z79899 Other long term (current) drug therapy: Secondary | ICD-10-CM

## 2023-01-16 DIAGNOSIS — D0512 Intraductal carcinoma in situ of left breast: Secondary | ICD-10-CM | POA: Diagnosis present

## 2023-01-16 DIAGNOSIS — Z923 Personal history of irradiation: Secondary | ICD-10-CM | POA: Insufficient documentation

## 2023-01-16 DIAGNOSIS — Z853 Personal history of malignant neoplasm of breast: Secondary | ICD-10-CM | POA: Diagnosis not present

## 2023-01-16 NOTE — Progress Notes (Signed)
[facility] CONSULT NOTE  Patient Care Team: Early, Coralee Pesa, NP as PCP - General (Nurse Practitioner)  CHIEF COMPLAINTS/PURPOSE OF CONSULTATION:  Newly diagnosed breast cancer  HISTORY OF PRESENTING ILLNESS:  Savannah [name] 67 y.o. female is here because of recent diagnosis of left breast DCIS.  She had a prior history of right breast cancer in 2008 treated by Dr. Truddie Coco.  She underwent lumpectomy followed that up by radiation and antiestrogen therapy.  She underwent oophorectomy at the age of 14 to reduce her breast cancer risk.  She went on tamoxifen for 10 years and discontinued at that time. She had a routine screening mammogram that detected abnormality in the left breast which was biopsy-proven to be intermediate to high-grade DCIS.  She was seen by Dr. Brantley Stage who is planning to do a lumpectomy.  I reviewed her records extensively and collaborated the history with the patient.  SUMMARY OF ONCOLOGIC HISTORY: Oncology History  Ductal carcinoma in situ (DCIS) of left breast  2008 Initial Biopsy   Right breast cancer T1 N1 M0 ER/PR positive HER2 negative status postlumpectomy radiation, Oncotype score 18, adjuvant antiestrogen therapy with tamoxifen x 10 years (intolerant to anastrozole and exemestane)   12/27/2022 Initial Diagnosis   Screening mammogram detected calcifications in the left breast, biopsies UOQ and LOQ revealed intermediate grade DCIS with necrosis and calcifications, ER 100%, PR 40%      MEDICAL HISTORY:  Past Medical History:  Diagnosis Date   Adhesive capsulitis    BPPV (benign paroxysmal positional vertigo)    Breast cancer ([place]) 2008   Endometriosis    Fibromyalgia    GERD (gastroesophageal reflux disease)    Hyperlipidemia    Lymphedema    Malaria 1996   Personal history of radiation therapy 2008   PONV (postoperative nausea and vomiting)    Shingles 2002    SURGICAL HISTORY: Past Surgical History:  Procedure Laterality Date    BILATERAL SALPINGOOPHORECTOMY     BREAST BIOPSY Left 12/27/2022   MM LT BREAST BX W LOC DEV 1ST LESION IMAGE BX SPEC STEREO GUIDE 12/27/2022 [facility]   BREAST BIOPSY Left 12/27/2022   MM LT BREAST BX W LOC DEV EA AD LESION IMG BX SPEC STEREO GUIDE 12/27/2022 [facility]   BREAST LUMPECTOMY Right 2008   with right axillary node resection   CHOLECYSTECTOMY     LAPAROSCOPY     MASS EXCISION Right 04/30/2018   Procedure: EXCISION CYST DEBRIDEMENT INTERPHALANGEAL JOINT RIGHT THUMB;  Surgeon: Daryll Brod, MD;  Location: [facility];  Service: Orthopedics;  Laterality: Right;   TONSILLECTOMY     TOTAL KNEE ARTHROPLASTY Bilateral 07/2018, 10/2018   VAGINAL HYSTERECTOMY      SOCIAL HISTORY: Social History   Socioeconomic History   Marital status: Single    Spouse name: Not on file   Number of children: Not on file   Years of education: Not on file   Highest education level: Not on file  Occupational History   Not on file  Tobacco Use   Smoking status: Never   Smokeless tobacco: Never  Vaping Use   Vaping Use: Never used  Substance and Sexual Activity   Alcohol use: Yes    Comment: 2 glasses of wine with dinner, 3-4x/week   Drug use: Never   Sexual activity: Not Currently    Birth control/protection: Surgical  Other Topics Concern   Not on file  Social History Narrative   IT sales professional   Social Determinants  of Health   Financial Resource Strain: Low Risk  (09/20/2022)   Overall Financial Resource Strain (CARDIA)    Difficulty of Paying Living Expenses: Not hard at all  Food Insecurity: No Food Insecurity (09/20/2022)   Hunger Vital Sign    Worried About Running Out of Food in the Last Year: Never true    Ran Out of Food in the Last Year: Never true  Transportation Needs: No Transportation Needs (09/20/2022)   PRAPARE - Hydrologist (Medical): No    Lack of Transportation (Non-Medical): No  Physical Activity:  Inactive (09/20/2022)   Exercise Vital Sign    Days of Exercise per Week: 0 days    Minutes of Exercise per Session: 0 min  Stress: Stress Concern Present (09/20/2022)   [place]    Feeling of Stress : To some extent  Social Connections: Not on file  Intimate Partner Violence: Not on file    FAMILY HISTORY: Family History  Problem Relation Age of Onset   Cancer Mother    Rectal cancer Mother    Stroke Mother    Heart disease Father    Heart attack Father    Crohn's disease Maternal Uncle    Liver disease Neg Hx    Colon polyps Neg Hx    Breast cancer Neg Hx     ALLERGIES:  is allergic to darvon.  MEDICATIONS:  Current Outpatient Medications  Medication Sig Dispense Refill   ALPRAZolam (XANAX) 0.25 MG tablet Take 1-2 tablets (0.25-0.5 mg total) by mouth 3 (three) times daily as needed for anxiety. 20 tablet 0   atorvastatin (LIPITOR) 10 MG tablet Take 1 tablet (10 mg total) by mouth daily. 90 tablet 1   cetirizine (ZYRTEC) 10 MG tablet Take 10 mg by mouth daily.     Cholecalciferol (VITAMIN D) 125 MCG (5000 UT) CAPS Take 1 capsule by mouth daily.     fluticasone (FLONASE) 50 MCG/ACT nasal spray Place 2 sprays into both nostrils daily.     traMADol (ULTRAM) 50 MG tablet Take 50 mg by mouth every 6 (six) hours as needed for moderate pain.     traZODone (DESYREL) 50 MG tablet Take 1 tablet (50 mg total) by mouth at bedtime. 90 tablet 3   No current facility-administered medications for this visit.    REVIEW OF SYSTEMS:   Constitutional: Denies fevers, chills or abnormal night sweats Breast: Breast pain and nipple pain since the biopsy All other systems were reviewed with the patient and are negative.  PHYSICAL EXAMINATION: ECOG PERFORMANCE STATUS: 0 - Asymptomatic  Vitals:   01/16/23 1301  BP: (!) 153/66  Pulse: 78  Resp: 18  Temp: (!) 97 F (36.1 C)  SpO2: 100%   Filed Weights   01/16/23 1301   Weight: 169 lb 8 oz (76.9 kg)    GENERAL:alert, no distress and comfortable    LABORATORY DATA:  I have reviewed the data as listed Lab Results  Component Value Date   WBC 5.5 11/01/2022   HGB 13.7 11/01/2022   HCT 41.4 11/01/2022   MCV 89 11/01/2022   PLT 303 11/01/2022   Lab Results  Component Value Date   NA 140 11/01/2022   K 4.5 11/01/2022   CL 101 11/01/2022   CO2 25 11/01/2022    RADIOGRAPHIC STUDIES: I have personally reviewed the radiological reports and agreed with the findings in the report.  ASSESSMENT AND PLAN:  Ductal carcinoma  in situ (DCIS) of left breast 12/27/2022: Screening mammogram detected calcifications in the left breast, biopsies UOQ and LOQ revealed intermediate grade DCIS with necrosis and calcifications, ER 100%, PR 40%  Pathology review: I discussed with the patient the difference between DCIS and invasive breast cancer. It is considered a precancerous lesion. DCIS is classified as a 0. It is generally detected through mammograms as calcifications. We discussed the significance of grades and its impact on prognosis. We also discussed the importance of ER and PR receptors and their implications to adjuvant treatment options. Prognosis of DCIS dependence on grade, comedo necrosis. It is anticipated that if not treated, 20-30% of DCIS can develop into invasive breast cancer.  Recommendation: 1. Breast conserving surgery 2. Followed by adjuvant radiation therapy 3. Followed by antiestrogen therapy with tamoxifen 5 years  Tamoxifen counseling: We discussed the risks and benefits of tamoxifen. These include but not limited to insomnia, hot flashes, mood changes, vaginal dryness, and weight gain. Although rare, serious side effects including endometrial cancer, risk of blood clots were also discussed. We strongly believe that the benefits far outweigh the risks. Patient understands these risks and consented to starting treatment. Planned treatment  duration is 5 years.  Return to clinic after surgery to discuss the final pathology report and come up with an adjuvant treatment plan.    All questions were answered. The patient knows to call the clinic with any problems, questions or concerns.    Harriette Ohara, MD 01/16/23

## 2023-01-16 NOTE — Assessment & Plan Note (Signed)
12/27/2022: Screening mammogram detected calcifications in the left breast, biopsies UOQ and LOQ revealed intermediate grade DCIS with necrosis and calcifications, ER 100%, PR 40%  Pathology review: I discussed with the patient the difference between DCIS and invasive breast cancer. It is considered a precancerous lesion. DCIS is classified as a 0. It is generally detected through mammograms as calcifications. We discussed the significance of grades and its impact on prognosis. We also discussed the importance of ER and PR receptors and their implications to adjuvant treatment options. Prognosis of DCIS dependence on grade, comedo necrosis. It is anticipated that if not treated, 20-30% of DCIS can develop into invasive breast cancer.  Recommendation: 1. Breast conserving surgery 2. Followed by adjuvant radiation therapy 3. Followed by antiestrogen therapy with tamoxifen 5 years  Tamoxifen counseling: We discussed the risks and benefits of tamoxifen. These include but not limited to insomnia, hot flashes, mood changes, vaginal dryness, and weight gain. Although rare, serious side effects including endometrial cancer, risk of blood clots were also discussed. We strongly believe that the benefits far outweigh the risks. Patient understands these risks and consented to starting treatment. Planned treatment duration is 5 years.  Return to clinic after surgery to discuss the final pathology report and come up with an adjuvant treatment plan.

## 2023-01-17 ENCOUNTER — Encounter (HOSPITAL_BASED_OUTPATIENT_CLINIC_OR_DEPARTMENT_OTHER): Payer: Self-pay | Admitting: Surgery

## 2023-01-24 ENCOUNTER — Telehealth: Payer: Self-pay | Admitting: Licensed Clinical Social Worker

## 2023-01-24 ENCOUNTER — Encounter: Payer: Self-pay | Admitting: *Deleted

## 2023-01-24 NOTE — Telephone Encounter (Signed)
[facility] Work  Clinical Social Work was referred by new patient protocol for assessment of psychosocial needs.  Clinical Social Worker attempted to contact patient by phone  to offer support and assess for needs.   No answer. Left VM with direct contact information and basic information on support services.     [place], [place] Worker [employer]

## 2023-01-27 ENCOUNTER — Ambulatory Visit
Admission: RE | Admit: 2023-01-27 | Discharge: 2023-01-27 | Disposition: A | Discharge status: Home / Self Care | Payer: Medicare Other | Source: Ambulatory Visit | Attending: Surgery | Admitting: Surgery

## 2023-01-27 DIAGNOSIS — D0512 Intraductal carcinoma in situ of left breast: Secondary | ICD-10-CM

## 2023-01-27 HISTORY — PX: BREAST BIOPSY: SHX20

## 2023-01-28 ENCOUNTER — Encounter (HOSPITAL_BASED_OUTPATIENT_CLINIC_OR_DEPARTMENT_OTHER)
Admission: RE | Disposition: A | Discharge status: Home / Self Care | Payer: Self-pay | Source: Ambulatory Visit | Attending: Surgery

## 2023-01-28 ENCOUNTER — Other Ambulatory Visit: Payer: Self-pay

## 2023-01-28 ENCOUNTER — Ambulatory Visit (HOSPITAL_BASED_OUTPATIENT_CLINIC_OR_DEPARTMENT_OTHER): Payer: Medicare Other | Admitting: Anesthesiology

## 2023-01-28 ENCOUNTER — Encounter (HOSPITAL_BASED_OUTPATIENT_CLINIC_OR_DEPARTMENT_OTHER): Payer: Self-pay | Admitting: Surgery

## 2023-01-28 ENCOUNTER — Ambulatory Visit
Admission: RE | Admit: 2023-01-28 | Discharge: 2023-01-28 | Disposition: A | Discharge status: Home / Self Care | Payer: Medicare Other | Source: Ambulatory Visit | Attending: Surgery | Admitting: Surgery

## 2023-01-28 ENCOUNTER — Ambulatory Visit (HOSPITAL_BASED_OUTPATIENT_CLINIC_OR_DEPARTMENT_OTHER)
Admission: RE | Admit: 2023-01-28 | Discharge: 2023-01-28 | Disposition: A | Discharge status: Home / Self Care | Payer: Medicare Other | Source: Ambulatory Visit | Attending: Surgery | Admitting: Surgery

## 2023-01-28 DIAGNOSIS — E785 Hyperlipidemia, unspecified: Secondary | ICD-10-CM | POA: Diagnosis not present

## 2023-01-28 DIAGNOSIS — Z853 Personal history of malignant neoplasm of breast: Secondary | ICD-10-CM | POA: Diagnosis not present

## 2023-01-28 DIAGNOSIS — D0512 Intraductal carcinoma in situ of left breast: Secondary | ICD-10-CM

## 2023-01-28 DIAGNOSIS — E079 Disorder of thyroid, unspecified: Secondary | ICD-10-CM | POA: Diagnosis not present

## 2023-01-28 DIAGNOSIS — M797 Fibromyalgia: Secondary | ICD-10-CM | POA: Insufficient documentation

## 2023-01-28 DIAGNOSIS — K219 Gastro-esophageal reflux disease without esophagitis: Secondary | ICD-10-CM | POA: Diagnosis not present

## 2023-01-28 DIAGNOSIS — F419 Anxiety disorder, unspecified: Secondary | ICD-10-CM | POA: Insufficient documentation

## 2023-01-28 HISTORY — PX: BREAST LUMPECTOMY WITH RADIOACTIVE SEED LOCALIZATION: SHX6424

## 2023-01-28 SURGERY — BREAST LUMPECTOMY WITH RADIOACTIVE SEED LOCALIZATION
Anesthesia: General | Site: Breast | Laterality: Left

## 2023-01-28 MED ORDER — LIDOCAINE 2% (20 MG/ML) 5 ML SYRINGE
INTRAMUSCULAR | Status: AC
  Filled 2023-01-28: qty 5

## 2023-01-28 MED ORDER — LACTATED RINGERS IV SOLN: INTRAVENOUS | Status: DC

## 2023-01-28 MED ORDER — PROPOFOL 10 MG/ML IV BOLUS
INTRAVENOUS | Status: DC | PRN
  Administered 2023-01-28: 150 mg via INTRAVENOUS

## 2023-01-28 MED ORDER — OXYCODONE HCL 5 MG PO TABS: 5.0000 mg | ORAL_TABLET | Freq: Four times a day (QID) | ORAL | 0 refills | Status: DC | PRN

## 2023-01-28 MED ORDER — SODIUM CHLORIDE 0.9 % IV SOLN
INTRAVENOUS | Status: AC
  Filled 2023-01-28: qty 10

## 2023-01-28 MED ORDER — OXYCODONE HCL 5 MG PO TABS
ORAL_TABLET | ORAL | Status: AC
  Filled 2023-01-28: qty 1

## 2023-01-28 MED ORDER — ACETAMINOPHEN 325 MG PO TABS: 325.0000 mg | ORAL_TABLET | ORAL | Status: DC | PRN

## 2023-01-28 MED ORDER — ACETAMINOPHEN 500 MG PO TABS
1000.0000 mg | ORAL_TABLET | ORAL | Status: AC
  Administered 2023-01-28: 1000 mg via ORAL

## 2023-01-28 MED ORDER — FENTANYL CITRATE (PF) 100 MCG/2ML IJ SOLN
INTRAMUSCULAR | Status: AC
  Filled 2023-01-28: qty 2

## 2023-01-28 MED ORDER — ONDANSETRON HCL 4 MG/2ML IJ SOLN
INTRAMUSCULAR | Status: DC | PRN
  Administered 2023-01-28: 4 mg via INTRAVENOUS

## 2023-01-28 MED ORDER — PROPOFOL 500 MG/50ML IV EMUL
INTRAVENOUS | Status: DC | PRN
  Administered 2023-01-28: 200 ug/kg/min via INTRAVENOUS

## 2023-01-28 MED ORDER — SODIUM CHLORIDE 0.9 % IV SOLN
INTRAVENOUS | Status: DC | PRN
  Administered 2023-01-28: 500 mL

## 2023-01-28 MED ORDER — FENTANYL CITRATE (PF) 100 MCG/2ML IJ SOLN
INTRAMUSCULAR | Status: DC | PRN
  Administered 2023-01-28: 25 ug via INTRAVENOUS
  Administered 2023-01-28: 50 ug via INTRAVENOUS
  Administered 2023-01-28: 25 ug via INTRAVENOUS

## 2023-01-28 MED ORDER — MIDAZOLAM HCL 5 MG/5ML IJ SOLN
INTRAMUSCULAR | Status: DC | PRN
  Administered 2023-01-28: 2 mg via INTRAVENOUS

## 2023-01-28 MED ORDER — PROPOFOL 10 MG/ML IV BOLUS
INTRAVENOUS | Status: AC
  Filled 2023-01-28: qty 20

## 2023-01-28 MED ORDER — CHLORHEXIDINE GLUCONATE CLOTH 2 % EX PADS: 6.0000 | MEDICATED_PAD | Freq: Once | CUTANEOUS | Status: DC

## 2023-01-28 MED ORDER — LIDOCAINE HCL (CARDIAC) PF 100 MG/5ML IV SOSY
PREFILLED_SYRINGE | INTRAVENOUS | Status: DC | PRN
  Administered 2023-01-28: 40 mg via INTRATRACHEAL

## 2023-01-28 MED ORDER — ACETAMINOPHEN 160 MG/5ML PO SOLN: 325.0000 mg | ORAL | Status: DC | PRN

## 2023-01-28 MED ORDER — OXYCODONE HCL 5 MG PO TABS
5.0000 mg | ORAL_TABLET | Freq: Once | ORAL | Status: AC | PRN
  Administered 2023-01-28: 5 mg via ORAL

## 2023-01-28 MED ORDER — ACETAMINOPHEN 10 MG/ML IV SOLN: 1000.0000 mg | Freq: Once | INTRAVENOUS | Status: DC | PRN

## 2023-01-28 MED ORDER — CEFAZOLIN SODIUM-DEXTROSE 2-4 GM/100ML-% IV SOLN
INTRAVENOUS | Status: AC
  Filled 2023-01-28: qty 100

## 2023-01-28 MED ORDER — FENTANYL CITRATE (PF) 100 MCG/2ML IJ SOLN
25.0000 ug | INTRAMUSCULAR | Status: DC | PRN
  Administered 2023-01-28: 25 ug via INTRAVENOUS
  Administered 2023-01-28: 50 ug via INTRAVENOUS

## 2023-01-28 MED ORDER — OXYCODONE HCL 5 MG/5ML PO SOLN: 5.0000 mg | Freq: Once | ORAL | Status: AC | PRN

## 2023-01-28 MED ORDER — MIDAZOLAM HCL 2 MG/2ML IJ SOLN
INTRAMUSCULAR | Status: AC
  Filled 2023-01-28: qty 2

## 2023-01-28 MED ORDER — AMISULPRIDE (ANTIEMETIC) 5 MG/2ML IV SOLN: 10.0000 mg | Freq: Once | INTRAVENOUS | Status: DC | PRN

## 2023-01-28 MED ORDER — ACETAMINOPHEN 500 MG PO TABS
ORAL_TABLET | ORAL | Status: AC
  Filled 2023-01-28: qty 2

## 2023-01-28 MED ORDER — BUPIVACAINE HCL (PF) 0.25 % IJ SOLN
INTRAMUSCULAR | Status: DC | PRN
  Administered 2023-01-28: 20 mL

## 2023-01-28 MED ORDER — ONDANSETRON HCL 4 MG/2ML IJ SOLN
INTRAMUSCULAR | Status: AC
  Filled 2023-01-28: qty 2

## 2023-01-28 MED ORDER — DEXAMETHASONE SODIUM PHOSPHATE 10 MG/ML IJ SOLN
INTRAMUSCULAR | Status: DC | PRN
  Administered 2023-01-28: 10 mg via INTRAVENOUS

## 2023-01-28 MED ORDER — DEXAMETHASONE SODIUM PHOSPHATE 10 MG/ML IJ SOLN
INTRAMUSCULAR | Status: AC
  Filled 2023-01-28: qty 1

## 2023-01-28 MED ORDER — CEFAZOLIN SODIUM-DEXTROSE 2-4 GM/100ML-% IV SOLN
2.0000 g | INTRAVENOUS | Status: AC
  Administered 2023-01-28: 2 g via INTRAVENOUS

## 2023-01-28 SURGICAL SUPPLY — 48 items
ADH SKN CLS APL DERMABOND .7 (GAUZE/BANDAGES/DRESSINGS) ×1
APL PRP STRL LF DISP 70% ISPRP (MISCELLANEOUS) ×1
APPLIER CLIP 9.375 MED OPEN (MISCELLANEOUS)
APR CLP MED 9.3 20 MLT OPN (MISCELLANEOUS)
BINDER BREAST LRG (GAUZE/BANDAGES/DRESSINGS) IMPLANT
BINDER BREAST MEDIUM (GAUZE/BANDAGES/DRESSINGS) IMPLANT
BINDER BREAST XLRG (GAUZE/BANDAGES/DRESSINGS) IMPLANT
BINDER BREAST XXLRG (GAUZE/BANDAGES/DRESSINGS) IMPLANT
BLADE SURG 15 STRL LF DISP TIS (BLADE) ×2 IMPLANT
BLADE SURG 15 STRL SS (BLADE) ×1
CANISTER SUC SOCK COL 7IN (MISCELLANEOUS) IMPLANT
CANISTER SUCT 1200ML W/VALVE (MISCELLANEOUS) IMPLANT
CHLORAPREP W/TINT 26 (MISCELLANEOUS) ×2 IMPLANT
CLIP APPLIE 9.375 MED OPEN (MISCELLANEOUS) IMPLANT
COVER BACK TABLE 60X90IN (DRAPES) ×2 IMPLANT
COVER MAYO STAND STRL (DRAPES) ×2 IMPLANT
COVER PROBE CYLINDRICAL 5X96 (MISCELLANEOUS) ×2 IMPLANT
DERMABOND ADVANCED .7 DNX12 (GAUZE/BANDAGES/DRESSINGS) ×2 IMPLANT
DRAPE LAPAROTOMY 100X72 PEDS (DRAPES) ×2 IMPLANT
DRAPE UTILITY XL STRL (DRAPES) ×2 IMPLANT
ELECT COATED BLADE 2.86 ST (ELECTRODE) ×2 IMPLANT
ELECT REM PT RETURN 9FT ADLT (ELECTROSURGICAL) ×1
ELECTRODE REM PT RTRN 9FT ADLT (ELECTROSURGICAL) ×2 IMPLANT
GLOVE BIOGEL PI IND STRL 8 (GLOVE) ×2 IMPLANT
GLOVE ECLIPSE 8.0 STRL XLNG CF (GLOVE) ×2 IMPLANT
GOWN STRL REUS W/ TWL LRG LVL3 (GOWN DISPOSABLE) ×4 IMPLANT
GOWN STRL REUS W/ TWL XL LVL3 (GOWN DISPOSABLE) ×2 IMPLANT
GOWN STRL REUS W/TWL LRG LVL3 (GOWN DISPOSABLE) ×2
GOWN STRL REUS W/TWL XL LVL3 (GOWN DISPOSABLE) ×1
HEMOSTAT ARISTA ABSORB 3G PWDR (HEMOSTASIS) IMPLANT
HEMOSTAT SNOW SURGICEL 2X4 (HEMOSTASIS) IMPLANT
KIT MARKER MARGIN INK (KITS) ×2 IMPLANT
NDL HYPO 25X1 1.5 SAFETY (NEEDLE) ×2 IMPLANT
NEEDLE HYPO 25X1 1.5 SAFETY (NEEDLE) ×1 IMPLANT
NS IRRIG 1000ML POUR BTL (IV SOLUTION) ×2 IMPLANT
PACK BASIN DAY SURGERY FS (CUSTOM PROCEDURE TRAY) ×2 IMPLANT
PENCIL SMOKE EVACUATOR (MISCELLANEOUS) ×2 IMPLANT
SLEEVE SCD COMPRESS KNEE MED (STOCKING) ×2 IMPLANT
SPIKE FLUID TRANSFER (MISCELLANEOUS) IMPLANT
SPONGE T-LAP 4X18 ~~LOC~~+RFID (SPONGE) ×2 IMPLANT
SUT MNCRL AB 4-0 PS2 18 (SUTURE) ×2 IMPLANT
SUT SILK 2 0 SH (SUTURE) IMPLANT
SUT VICRYL 3-0 CR8 SH (SUTURE) ×2 IMPLANT
SYR CONTROL 10ML LL (SYRINGE) ×2 IMPLANT
TOWEL GREEN STERILE FF (TOWEL DISPOSABLE) ×2 IMPLANT
TRAY FAXITRON CT DISP (TRAY / TRAY PROCEDURE) ×2 IMPLANT
TUBE CONNECTING 20X1/4 (TUBING) IMPLANT
YANKAUER SUCT BULB TIP NO VENT (SUCTIONS) IMPLANT

## 2023-01-28 NOTE — H&P (Signed)
History of Present Illness: Savannah [name] is a 67 y.o. female who is seen today as an office consultation for evaluation of New Consultation and Breast Cancer  Patient presents for evaluation of left breast microcalcifications in the upper outer quadrant core biopsy proven to be DCIS. History of right breast cancer treated in 2008 with breast conserving surgery. She also radiation therapy on the right. She has developed a cluster of left breast microcalcifications noted on screening mammogram. She underwent diagnostic mammography and core biopsy which showed DCIS. The area measures about 5 and half centimeters. No history of breast pain nipple discharge or other physical finding.  Review of Systems: A complete review of systems was obtained from the patient. I have reviewed this information and discussed as appropriate with the patient. See HPI as well for other ROS.    Medical History: Past Medical History:  Diagnosis Date  Anxiety  Arthritis  GERD (gastroesophageal reflux disease)  History of cancer  Hyperlipidemia  Thyroid disease   There is no problem list on file for this patient.  Past Surgical History:  Procedure Laterality Date  HYSTERECTOMY  MASTECTOMY PARTIAL / LUMPECTOMY    Allergies  Allergen Reactions  Propoxyphene Itching, Nausea and Other (See Comments)  Tramadol Hcl Itching   Current Outpatient Medications on File Prior to Visit  Medication Sig Dispense Refill  atorvastatin (LIPITOR) 10 MG tablet Take 1 tablet by mouth at bedtime  traZODone (DESYREL) 50 MG tablet  traMADoL (ULTRAM) 50 mg tablet   No current facility-administered medications on file prior to visit.   Family History  Problem Relation Age of Onset  Stroke Mother  Obesity Mother  High blood pressure (Hypertension) Mother  Colon cancer Mother  Obesity Father  High blood pressure (Hypertension) Father  Coronary Artery Disease (Blocked arteries around heart) Father    Social History    Tobacco Use  Smoking Status Never  Smokeless Tobacco Never    Social History   Socioeconomic History  Marital status: Single  Tobacco Use  Smoking status: Never  Smokeless tobacco: Never  Substance and Sexual Activity  Alcohol use: Not Currently  Comment: 5x a week  Drug use: Never   Objective:   Vitals:  01/03/23 1003  BP: 130/72  Pulse: 89  Temp: 36.7 C (98 F)  SpO2: 97%  Weight: 77.1 kg (170 lb)  Height: 167.6 cm ('5\' 6"'$ )   Body mass index is 27.44 kg/m.  Physical Exam Exam conducted with a chaperone present.  HENT:  Head: Normocephalic.  Cardiovascular:  Rate and Rhythm: Normal rate.  Pulmonary:  Effort: Pulmonary effort is normal.  Breath sounds: No stridor.  Chest:  Breasts: Right: No mass.  Left: Swelling present. No mass.   Comments: Right breast surgery changes noted. No masses. Postradiation changes noted on the right. Left breast shows bruising from biopsy. No masses. Small hematoma noted. Musculoskeletal:  General: Normal range of motion.  Cervical back: Normal range of motion.  Lymphadenopathy:  Upper Body:  Right upper body: No supraclavicular or axillary adenopathy.  Left upper body: No supraclavicular or axillary adenopathy.  Skin: General: Skin is warm.  Neurological:  General: No focal deficit present.  Mental Status: She is alert.  Psychiatric:  Mood and Affect: Mood normal.  Behavior: Behavior normal.     Labs, Imaging and Diagnostic Testing:  Diagnosis 1. Breast, left, needle core biopsy, upper outer (X) DUCTAL CARCINOMA IN SITU, CRIBRIFORM, INTERMEDIATE NUCLEAR GRADE NECROSIS: PRESENT CALCIFICATIONS: PRESENT DCIS LENGTH: 0.4 CM 2. Breast, left, needle  core biopsy, lower outer (coil) DUCTAL CARCINOMA IN SITU, CRIBRIFORM, INTERMEDIATE TO HIGH NUCLEAR GRADE NECROSIS: PRESENT CALCIFICATIONS: PRESENT DCIS LENGTH: 1.1 CM  CLINICAL DATA: Patient returns after screening study for evaluation of LEFT breast  calcifications. History of RIGHT lumpectomy with radiation treatment in 2008.  EXAM: DIGITAL DIAGNOSTIC UNILATERAL LEFT MAMMOGRAM WITH CAD  TECHNIQUE: Left digital diagnostic mammography was performed.  COMPARISON: Previous exam(s).  ACR Breast Density Category b: There are scattered areas of fibroglandular density.  FINDINGS: Magnified views are performed of calcifications in the UPPER-OUTER QUADRANT of the LEFT breast. These views show faint pleomorphic calcifications, some in a linear distribution in the [place]. Calcifications span 4.5 x 5.5 x 3.7 centimeters.  IMPRESSION: Indeterminate 5.5 centimeter group of calcifications in the UPPER-OUTER QUADRANT of the LEFT breast warranting tissue diagnosis.  RECOMMENDATION: Recommend 2 site stereotactic biopsy of LEFT breast calcifications.  I have discussed the findings and recommendations with the patient. If applicable, a reminder letter will be sent to the patient regarding the next appointment.  BI-RADS CATEGORY 4: Suspicious.   Electronically Signed By: Nolon Nations M.D. On: 12/11/2022 16:27  Assessment and Plan:   Diagnoses and all orders for this visit:  Ductal carcinoma in situ of left breast - Ambulatory Referral to Oncology-Medical - Ambulatory Referral to Radiation Oncology  Other orders - diclofenac (VOLTAREN) 1 % topical gel; Apply 2 g topically 4 (four) times daily - lidocaine (XYLOCAINE) 2 % jelly; Apply topically once daily as needed   Discussed breast conserving surgery with possible radiation. Discussed role of sentinel lymph node mapping and DCIS that this can be done at a later time. Reviewed the data on breast conserving surgery versus mastectomy reconstruction as well as given her history of having on the right as well. She is opted for left breast seed localized lumpectomy using 2 seeds and a bracketed approach.The procedure has been discussed with the patient. Alternatives to  surgery have been discussed with the patient. Risks of surgery include bleeding, Infection, Seroma formation, death, and the need for further surgery. The patient understands and wishes to proceed.    Kennieth Francois, MD

## 2023-01-28 NOTE — Transfer of Care (Signed)
Immediate Anesthesia Transfer of Care Note  Patient: Savannah [name]  Procedure(s) Performed: LEFT BREAST BRACKETED LUMPECTOMY WITH RADIOACTIVE SEED LOCALIZATION (Left: Breast)  Patient Location: PACU  Anesthesia Type:General  Level of Consciousness: drowsy, patient cooperative, and responds to stimulation  Airway & Oxygen Therapy: Patient Spontanous Breathing and Patient connected to face mask oxygen  Post-op Assessment: Report given to RN and Post -op Vital signs reviewed and stable  Post vital signs: Reviewed and stable  Last Vitals:  Vitals Value Taken Time  BP    Temp    Pulse 70 01/28/23 1000  Resp    SpO2 100 % 01/28/23 1000  Vitals shown include unvalidated device data.  Last Pain:  Vitals:   01/28/23 0738  TempSrc: Oral  PainSc: 0-No pain      Patients Stated Pain Goal: 3 ([id number])  Complications: No notable events documented.

## 2023-01-28 NOTE — Discharge Instructions (Addendum)
[facility] Office Phone Number [phone]  BREAST BIOPSY/ PARTIAL MASTECTOMY: POST OP INSTRUCTIONS  Always review your discharge instruction sheet given to you by the facility where your surgery was performed.  IF YOU HAVE DISABILITY OR FAMILY LEAVE FORMS, YOU MUST BRING THEM TO THE OFFICE FOR PROCESSING.  DO NOT GIVE THEM TO YOUR DOCTOR.  A prescription for pain medication may be given to you upon discharge.  Take your pain medication as prescribed, if needed.  If narcotic pain medicine is not needed, then you may take acetaminophen (Tylenol) or ibuprofen (Advil) as needed  May have Tylenol today after 1:40 PM  Take your usually prescribed medications unless otherwise directed If you need a refill on your pain medication, please contact your pharmacy.  They will contact our office to request authorization.  Prescriptions will not be filled after 5pm or on week-ends. You should eat very light the first 24 hours after surgery, such as soup, crackers, pudding, etc.  Resume your normal diet the day after surgery. Most patients will experience some swelling and bruising in the breast.  Ice packs and a good support bra will help.  Swelling and bruising can take several days to resolve.  It is common to experience some constipation if taking pain medication after surgery.  Increasing fluid intake and taking a stool softener will usually help or prevent this problem from occurring.  A mild laxative (Milk of Magnesia or Miralax) should be taken according to package directions if there are no bowel movements after 48 hours. Unless discharge instructions indicate otherwise, you may remove your bandages 24-48 hours after surgery, and you may shower at that time.  You may have steri-strips (small skin tapes) in place directly over the incision.  These strips should be left on the skin for 7-10 days.  If your surgeon used skin glue on the incision, you may shower in 24 hours.  The glue will  flake off over the next 2-3 weeks.  Any sutures or staples will be removed at the office during your follow-up visit. ACTIVITIES:  You may resume regular daily activities (gradually increasing) beginning the next day.  Wearing a good support bra or sports bra minimizes pain and swelling.  You may have sexual intercourse when it is comfortable. You may drive when you no longer are taking prescription pain medication, you can comfortably wear a seatbelt, and you can safely maneuver your car and apply brakes. RETURN TO WORK:  ______________________________________________________________________________________ Dennis Bast should see your doctor in the office for a follow-up appointment approximately two weeks after your surgery.  Your doctor's nurse will typically make your follow-up appointment when she calls you with your pathology report.  Expect your pathology report 2-3 business days after your surgery.  You may call to check if you do not hear from [place] after three days. OTHER INSTRUCTIONS: _______________________________________________________________________________________________ _____________________________________________________________________________________________________________________________________ _____________________________________________________________________________________________________________________________________ _____________________________________________________________________________________________________________________________________  WHEN TO CALL YOUR DOCTOR: Fever over 101.0 Nausea and/or vomiting. Extreme swelling or bruising. Continued bleeding from incision. Increased pain, redness, or drainage from the incision.  The clinic staff is available to answer your questions during regular business hours.  Please don't hesitate to call and ask to speak to one of the nurses for clinical concerns.  If you have a medical emergency, go to the nearest emergency room  or call 911.  A surgeon from [facility] is always on call at the hospital.  For further questions, please visit [email or web address]     Post Anesthesia Home Care Instructions  Activity: Get plenty of rest for the remainder of the day. A responsible individual must stay with you for 24 hours following the procedure.  For the next 24 hours, DO NOT: -Drive a car -Paediatric nurse -Drink alcoholic beverages -Take any medication unless instructed by your physician -Make any legal decisions or sign important papers.  Meals: Start with liquid foods such as gelatin or soup. Progress to regular foods as tolerated. Avoid greasy, spicy, heavy foods. If nausea and/or vomiting occur, drink only clear liquids until the nausea and/or vomiting subsides. Call your physician if vomiting continues.  Special Instructions/Symptoms: Your throat may feel dry or sore from the anesthesia or the breathing tube placed in your throat during surgery. If this causes discomfort, gargle with warm salt water. The discomfort should disappear within 24 hours.  If you had a scopolamine patch placed behind your ear for the management of post- operative nausea and/or vomiting:  1. The medication in the patch is effective for 72 hours, after which it should be removed.  Wrap patch in a tissue and discard in the trash. Wash hands thoroughly with soap and water. 2. You may remove the patch earlier than 72 hours if you experience unpleasant side effects which may include dry mouth, dizziness or visual disturbances. 3. Avoid touching the patch. Wash your hands with soap and water after contact with the patch.

## 2023-01-28 NOTE — Interval H&P Note (Signed)
History and Physical Interval Note:  01/28/2023 8:29 AM  Savannah [name]  has presented today for surgery, with the diagnosis of LEFT BREAST DCIS.  The various methods of treatment have been discussed with the patient and family. After consideration of risks, benefits and other options for treatment, the patient has consented to  Procedure(s): LEFT BREAST BRACKETED LUMPECTOMY WITH RADIOACTIVE SEED LOCALIZATION (Left) as a surgical intervention.  The patient's history has been reviewed, patient examined, no change in status, stable for surgery.  I have reviewed the patient's chart and labs.  Questions were answered to the patient's satisfaction.     [place]

## 2023-01-28 NOTE — Op Note (Signed)
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  Additional margin shaved oriented with ink.  Indications for procedure: The patient is a 67 year old female with left breast DCIS.  She was seen in the multidisciplinary setting and preferred breast conserving surgery.  Risks and benefits of this were discussed with her.  Patient with history of right breast cancer in 2008.The procedure has been discussed with the patient. Alternatives to surgery have been discussed with the patient.  Risks of surgery include bleeding,  Infection,  Seroma formation, death,  and the need for further surgery.   The patient understands and wishes to proceed.      Description of procedure: The patient was met in the holding area and questions answered.  Of note a seed was placed in her left breast and actually to reduce to bracket the area of disease in the left breast upper outer quadrant.  All questions were answered.  Left breast was marked as correct site.  She is taken back to the operative room.  She is placed supine upon the OR table.  After induction of general esthesia a timeout was performed.  Films were available for review.  Neoprobe used to identify both seeds in the left breast upper outer quadrant.  Local anesthetic was infiltrated throughout anchor a curvilinear incision was made between the 2 seed locations in the left breast upper outer quadrant.  Dissection was carried down all tissue around both seeds and both clips were excised.  This was sent to pathology after orientation and verification that both seeds were removed using the Faxitron.  I did decide to shave all margins and these were oriented with ink.  These were  sent separately.  The cavity was irrigated.  Hemostasis was achieved.  Local anesthetic infiltrated.  Deep tissue layers were approximated with 3-0 Vicryl.  4 Monocryl was used to close skin in a subcuticular fashion.  Dermabond applied.  All counts found to be correct.  The patient was then awoke extubated taken recovery in satisfactory condition.

## 2023-01-28 NOTE — Anesthesia Postprocedure Evaluation (Signed)
Anesthesia Post Note  Patient: Savannah [name]  Procedure(s) Performed: LEFT BREAST BRACKETED LUMPECTOMY WITH RADIOACTIVE SEED LOCALIZATION (Left: Breast)     Patient location during evaluation: PACU Anesthesia Type: General Level of consciousness: awake and alert Pain management: pain level controlled Vital Signs Assessment: post-procedure vital signs reviewed and stable Respiratory status: spontaneous breathing, nonlabored ventilation, respiratory function stable and patient connected to nasal cannula oxygen Cardiovascular status: blood pressure returned to baseline and stable Postop Assessment: no apparent nausea or vomiting Anesthetic complications: no  No notable events documented.  Last Vitals:  Vitals:   01/28/23 1015 01/28/23 1040  BP: 117/78 129/77  Pulse: (!) 57 68  Resp: 14 16  Temp:  36.8 C  SpO2: 95% 96%    Last Pain:  Vitals:   01/28/23 1054  TempSrc:   PainSc: [place] Ranie Chinchilla

## 2023-01-28 NOTE — Anesthesia Preprocedure Evaluation (Addendum)
Anesthesia Evaluation  Patient identified by MRN, date of birth, ID band Patient awake    Reviewed: Allergy & Precautions, NPO status , Patient's Chart, lab work & pertinent test results  History of Anesthesia Complications (+) PONV and history of anesthetic complications  Airway Mallampati: III  TM Distance: >3 FB Neck ROM: Full    Dental  (+) Teeth Intact, Caps   Pulmonary    breath sounds clear to auscultation       Cardiovascular negative cardio ROS  Rhythm:Regular Rate:Normal     Neuro/Psych   Anxiety        GI/Hepatic Neg liver ROS,GERD  ,,  Endo/Other  negative endocrine ROS    Renal/GU negative Renal ROS     Musculoskeletal  (+) Arthritis ,  Fibromyalgia -  Abdominal   Peds  Hematology negative hematology ROS (+)   Anesthesia Other Findings   Reproductive/Obstetrics                             Anesthesia Physical Anesthesia Plan  ASA: 2  Anesthesia Plan: General   Post-op Pain Management: Tylenol PO (pre-op)*   Induction: Intravenous  PONV Risk Score and Plan: 4 or greater and Ondansetron, TIVA and Treatment may vary due to age or medical condition  Airway Management Planned: LMA  Additional Equipment: None  Intra-op Plan:   Post-operative Plan: Extubation in OR  Informed Consent: I have reviewed the patients History and Physical, chart, labs and discussed the procedure including the risks, benefits and alternatives for the proposed anesthesia with the patient or authorized representative who has indicated his/her understanding and acceptance.     Dental advisory given  Plan Discussed with: CRNA  Anesthesia Plan Comments:        Anesthesia Quick Evaluation

## 2023-01-28 NOTE — Anesthesia Procedure Notes (Signed)
Procedure Name: LMA Insertion Date/Time: 01/28/2023 9:06 AM  Performed by: Glory Buff, CRNAPre-anesthesia Checklist: Patient identified, Emergency Drugs available, Suction available and Patient being monitored Patient Re-evaluated:Patient Re-evaluated prior to induction Oxygen Delivery Method: Circle system utilized Preoxygenation: Pre-oxygenation with 100% oxygen Induction Type: IV induction LMA: LMA inserted LMA Size: 4.0 Number of attempts: 1 Placement Confirmation: positive ETCO2 Tube secured with: Tape Dental Injury: Teeth and Oropharynx as per pre-operative assessment

## 2023-01-29 ENCOUNTER — Encounter (HOSPITAL_BASED_OUTPATIENT_CLINIC_OR_DEPARTMENT_OTHER): Payer: Self-pay | Admitting: Surgery

## 2023-01-30 LAB — SURGICAL PATHOLOGY

## 2023-02-04 ENCOUNTER — Encounter: Payer: Self-pay | Admitting: *Deleted

## 2023-02-04 NOTE — Progress Notes (Signed)
Patient Care Team: Early, Coralee Pesa, NP as PCP - General (Nurse Practitioner) Telephone visit  DIAGNOSIS:  Encounter Diagnosis  Name Primary?   Ductal carcinoma in situ (DCIS) of left breast Yes    SUMMARY OF ONCOLOGIC HISTORY: Oncology History  Ductal carcinoma in situ (DCIS) of left breast  2008 Initial Biopsy   Right breast cancer T1 N1 M0 ER/PR positive HER2 negative status postlumpectomy radiation, Oncotype score 18, adjuvant antiestrogen therapy with tamoxifen x 10 years (intolerant to anastrozole and exemestane)   12/27/2022 Initial Diagnosis   Screening mammogram detected calcifications in the left breast, biopsies UOQ and LOQ revealed intermediate grade DCIS with necrosis and calcifications, ER 100%, PR 40%   01/28/2023 Surgery   Left lumpectomy: High-grade DCIS 5 cm solid and cribriform types with necrosis, margins DCIS present within the lateral and inferior margins, ER 100%, PR 40%     CHIEF COMPLIANT: Follow-up after surgery   INTERVAL HISTORY: Savannah [name] is a 67 y.o. female is here because of recent diagnosis of left breast DCIS.  She had a prior history of right breast cancer. She presents to the clinic for a follow-up.  Patient had lumpectomy with positive margins and therefore she is contemplating on doing a mastectomy.  ALLERGIES:  is allergic to darvon.  MEDICATIONS:  Current Outpatient Medications  Medication Sig Dispense Refill   ALPRAZolam (XANAX) 0.25 MG tablet Take 1-2 tablets (0.25-0.5 mg total) by mouth 3 (three) times daily as needed for anxiety. 20 tablet 0   atorvastatin (LIPITOR) 10 MG tablet Take 1 tablet (10 mg total) by mouth daily. 90 tablet 1   cetirizine (ZYRTEC) 10 MG tablet Take 10 mg by mouth daily.     Cholecalciferol (VITAMIN D) 125 MCG (5000 UT) CAPS Take 1 capsule by mouth daily.     diphenhydrAMINE (BENADRYL) 25 mg capsule Take 50 mg by mouth at bedtime.     fluticasone (FLONASE) 50 MCG/ACT nasal spray Place 2 sprays into both  nostrils daily.     nirmatrelvir/ritonavir (PAXLOVID) 20 x 150 MG & 10 x '100MG'$  TABS Take 3 tablets by mouth 2 (two) times daily for 5 days. (Take nirmatrelvir 150 mg two tablets twice daily for 5 days and ritonavir 100 mg one tablet twice daily for 5 days) Patient GFR is >60 30 tablet 0   traMADol (ULTRAM) 50 MG tablet Take 1 tablet (50 mg total) by mouth every 8 (eight) hours as needed. 45 tablet 2   No current facility-administered medications for this visit.    PHYSICAL EXAMINATION: ECOG PERFORMANCE STATUS: 1 - Symptomatic but completely ambulatory  There were no vitals filed for this visit. There were no vitals filed for this visit.    LABORATORY DATA:  I have reviewed the data as listed    Latest Ref Rng & Units 11/01/2022   12:09 PM 05/17/2022    3:06 PM 09/11/2021   10:12 AM  CMP  Glucose 70 - 99 mg/dL 90  97  92   BUN 8 - 27 mg/dL '12  14  14   '$ Creatinine 0.57 - 1.00 mg/dL 0.74  0.81  0.73   Sodium 134 - 144 mmol/L 140  142  140   Potassium 3.5 - 5.2 mmol/L 4.5  4.0  4.3   Chloride 96 - 106 mmol/L 101  103  103   CO2 20 - 29 mmol/L '25  22  22   '$ Calcium 8.7 - 10.3 mg/dL 9.4  9.6  9.4   Total Protein  6.0 - 8.5 g/dL 6.6  6.9  6.5   Total Bilirubin 0.0 - 1.2 mg/dL 0.5  0.6  0.5   Alkaline Phos 44 - 121 IU/L 67  63  59   AST 0 - 40 IU/L '22  22  20   '$ ALT 0 - 32 IU/L '16  15  16     '$ Lab Results  Component Value Date   WBC 5.5 11/01/2022   HGB 13.7 11/01/2022   HCT 41.4 11/01/2022   MCV 89 11/01/2022   PLT 303 11/01/2022   NEUTROABS 3.8 11/01/2022    ASSESSMENT & PLAN:  Ductal carcinoma in situ (DCIS) of left breast 01/28/2023:Left lumpectomy: High-grade DCIS 5 cm solid and cribriform types with necrosis, margins DCIS present within the lateral and inferior margins, ER 100%, PR 40%  Pathology counseling: I discussed the final pathology report of the patient provided  a copy of this report. I discussed the margins.  We also discussed the final staging along with  previously performed ER/PR testing.   Treatment plan: Left mastectomy (patient preference) Followed by adjuvant tamoxifen x 5 years Patient is planning to go on a clinical trial of using FES-PET CT scan at [facility].  She wants to do this prior to her mastectomy just in case there is any evidence of metastatic disease. She was also asking about sentinel lymph node biopsy. I discussed with her that it is not necessary to do a sentinel lymph node for DCIS and Dr. Brantley Stage may consider doing Lactrase and case she has any evidence of invasive cancer on the mastectomy specimen.  She does not need radiation if she undergoes mastectomy.    No orders of the defined types were placed in this encounter.  The patient has a good understanding of the overall plan. she agrees with it. she will call with any problems that may develop before the next visit here. Total time spent: 22 mins including face to face time and time spent for planning, charting and co-ordination of care   Harriette Ohara, MD 02/07/23    I Gardiner Coins am acting as a Education administrator for [employer]  I have reviewed the above documentation for accuracy and completeness, and I agree with the above.

## 2023-02-06 ENCOUNTER — Encounter: Payer: Self-pay | Admitting: Nurse Practitioner

## 2023-02-06 ENCOUNTER — Telehealth (INDEPENDENT_AMBULATORY_CARE_PROVIDER_SITE_OTHER): Payer: Medicare Other | Admitting: Nurse Practitioner

## 2023-02-06 ENCOUNTER — Telehealth: Payer: Self-pay | Admitting: *Deleted

## 2023-02-06 VITALS — HR 84 | Temp 98.2°F | Wt 167.0 lb

## 2023-02-06 DIAGNOSIS — D0512 Intraductal carcinoma in situ of left breast: Secondary | ICD-10-CM | POA: Diagnosis not present

## 2023-02-06 DIAGNOSIS — U071 COVID-19: Secondary | ICD-10-CM | POA: Insufficient documentation

## 2023-02-06 MED ORDER — TRAMADOL HCL 50 MG PO TABS: 50.0000 mg | ORAL_TABLET | Freq: Three times a day (TID) | ORAL | 2 refills | Status: DC | PRN

## 2023-02-06 MED ORDER — NIRMATRELVIR/RITONAVIR (PAXLOVID)TABLET: 3.0000 | ORAL_TABLET | Freq: Two times a day (BID) | ORAL | 0 refills | Status: AC

## 2023-02-06 NOTE — Telephone Encounter (Signed)
Received call from pt stating she has tested positive for Covid, currently asymptomatic, and is requesting telephone visit for appt tomorrow.  Pt also requesting MD conference in her friend Sena Slate at [phone] to be apart of the conversation.  Appt changed to telephone with Marnie's number documented in appt notes for MD.

## 2023-02-06 NOTE — Progress Notes (Signed)
Virtual Visit Encounter mychart visit.   I connected with  Savannah [name] on 02/06/23 at  1:30 PM EST by secure video and audio telemedicine application. I verified that I am speaking with the correct person using two identifiers.   I introduced myself as a Designer, jewellery with the practice. The limitations of evaluation and management by telemedicine discussed with the patient and the availability of in person appointments. The patient expressed verbal understanding and consent to proceed.  Participating parties in this visit include: Myself and patient  The patient is: Patient Location: Home I am: Provider Location: Office/Clinic Subjective:    CC and HPI: Savannah [name] is a 67 y.o. year old female presenting for new evaluation and treatment of Covid.  COVID  Symptom onset Sunday/Monday.   Past medical history, Surgical history, Family history not pertinant except as noted below, Social history, Allergies, and medications have been entered into the medical record, reviewed, and corrections made.   Review of Systems:  All review of systems negative except what is listed in the HPI  Objective:    Alert and oriented x 4 Speaking in clear sentences with no shortness of breath. No distress.  Impression and Recommendations:    Problem List Items Addressed This Visit     COVID-19 - Primary   Relevant Medications   nirmatrelvir/ritonavir (PAXLOVID) 20 x 150 MG & 10 x '100MG'$  TABS   Ductal carcinoma in situ (DCIS) of left breast   Relevant Medications   nirmatrelvir/ritonavir (PAXLOVID) 20 x 150 MG & 10 x '100MG'$  TABS   traMADol (ULTRAM) 50 MG tablet    orders and follow up as documented in EMR I discussed the assessment and treatment plan with the patient. The patient was provided an opportunity to ask questions and all were answered. The patient agreed with the plan and demonstrated an understanding of the instructions.   The patient was advised to call back or seek an  in-person evaluation if the symptoms worsen or if the condition fails to improve as anticipated.  Follow-Up: prn  I provided 29 minutes of non-face-to-face interaction with this non face-to-face encounter including intake, same-day documentation, and chart review.   Orma Render, NP , DNP, AGNP-c [facility]

## 2023-02-06 NOTE — Patient Instructions (Signed)
You have been prescribed Paxlovid to treat your COVID-19 infection.   In the event your Paxlovid prescription is not covered by insurance, please follow the instructions below.  Go to the following website  [email or web address]  2. Select   I am a patient/caregiver   3. Answer the questions of eligibility  4. Show approval to pharmacist  If you need assistance, customer support can be contacted at [phone]  *Please note, this is a separate patient support program provided by [employer]. Your provider does not have any affiliation with this company and cannot guarantee that you will qualify for assistance. Your pharmacist may be able to provide information on additional support programs if you do not qualify for this one or wish to choose a different program.

## 2023-02-07 ENCOUNTER — Inpatient Hospital Stay: Payer: Medicare Other | Attending: Hematology and Oncology | Admitting: Hematology and Oncology

## 2023-02-07 DIAGNOSIS — D0512 Intraductal carcinoma in situ of left breast: Secondary | ICD-10-CM | POA: Diagnosis not present

## 2023-02-07 NOTE — Assessment & Plan Note (Signed)
01/28/2023:Left lumpectomy: High-grade DCIS 5 cm solid and cribriform types with necrosis, margins DCIS present within the lateral and inferior margins, ER 100%, PR 40%  Pathology counseling: I discussed the final pathology report of the patient provided  a copy of this report. I discussed the margins.  We also discussed the final staging along with previously performed ER/PR testing. We will discuss in tumor board about the final margins  Treatment plan: Adjuvant radiation therapy Followed by adjuvant tamoxifen x 5 years  Return to clinic after radiation is completed

## 2023-02-10 ENCOUNTER — Ambulatory Visit: Payer: Self-pay | Admitting: Surgery

## 2023-02-11 ENCOUNTER — Encounter: Payer: Self-pay | Admitting: Hematology and Oncology

## 2023-02-11 ENCOUNTER — Other Ambulatory Visit: Payer: Self-pay | Admitting: *Deleted

## 2023-02-11 ENCOUNTER — Encounter: Payer: Self-pay | Admitting: *Deleted

## 2023-02-11 DIAGNOSIS — D0512 Intraductal carcinoma in situ of left breast: Secondary | ICD-10-CM

## 2023-02-11 NOTE — Progress Notes (Signed)
Per pt request RN successfully faxed referral to Dr. Wetzel Bjornstad with [facility] for second opinion/ FES clinical trial.

## 2023-02-12 ENCOUNTER — Other Ambulatory Visit: Payer: Self-pay | Admitting: *Deleted

## 2023-02-12 DIAGNOSIS — D0512 Intraductal carcinoma in situ of left breast: Secondary | ICD-10-CM

## 2023-02-12 NOTE — Progress Notes (Signed)
Verbal orders received from MD to obtain CT CAP to r/o metastatic disease.  Orders placed.

## 2023-02-13 ENCOUNTER — Telehealth: Payer: Self-pay | Admitting: Hematology and Oncology

## 2023-02-13 ENCOUNTER — Encounter (HOSPITAL_BASED_OUTPATIENT_CLINIC_OR_DEPARTMENT_OTHER): Payer: Self-pay | Admitting: Surgery

## 2023-02-13 ENCOUNTER — Encounter: Payer: Self-pay | Admitting: *Deleted

## 2023-02-13 ENCOUNTER — Other Ambulatory Visit: Payer: Self-pay

## 2023-02-13 NOTE — Progress Notes (Signed)
Per patient report, patient tested positive for covid 02/05/23 with symptom resolution 02/08/23. Reviewed with Dr. Roanna Banning. Ok to proceed with surgery as planned 02/21/23 at [facility].

## 2023-02-13 NOTE — Telephone Encounter (Signed)
Per 3/14 IB reached out to schedule post op with patient, patient aware of date and time of appointment.

## 2023-02-14 ENCOUNTER — Ambulatory Visit (HOSPITAL_COMMUNITY)
Admission: RE | Admit: 2023-02-14 | Discharge: 2023-02-14 | Disposition: A | Discharge status: Home / Self Care | Payer: Medicare Other | Source: Ambulatory Visit | Attending: Hematology and Oncology | Admitting: Hematology and Oncology

## 2023-02-14 DIAGNOSIS — D0512 Intraductal carcinoma in situ of left breast: Secondary | ICD-10-CM | POA: Insufficient documentation

## 2023-02-14 MED ORDER — SODIUM CHLORIDE (PF) 0.9 % IJ SOLN
INTRAMUSCULAR | Status: AC
  Filled 2023-02-14: qty 50

## 2023-02-14 MED ORDER — IOHEXOL 9 MG/ML PO SOLN
1000.0000 mL | ORAL | Status: AC
  Administered 2023-02-14: 1000 mL via ORAL

## 2023-02-14 MED ORDER — IOHEXOL 300 MG/ML  SOLN
100.0000 mL | Freq: Once | INTRAMUSCULAR | Status: AC | PRN
  Administered 2023-02-14: 100 mL via INTRAVENOUS

## 2023-02-14 MED ORDER — IOHEXOL 9 MG/ML PO SOLN
ORAL | Status: AC
  Filled 2023-02-14: qty 1000

## 2023-02-19 ENCOUNTER — Other Ambulatory Visit: Payer: Self-pay | Admitting: Family Medicine

## 2023-02-19 ENCOUNTER — Encounter: Payer: Self-pay | Admitting: *Deleted

## 2023-02-19 ENCOUNTER — Encounter: Payer: Self-pay | Admitting: Nurse Practitioner

## 2023-02-19 DIAGNOSIS — F5104 Psychophysiologic insomnia: Secondary | ICD-10-CM

## 2023-02-19 DIAGNOSIS — F419 Anxiety disorder, unspecified: Secondary | ICD-10-CM

## 2023-02-19 MED ORDER — HYDROXYZINE HCL 25 MG PO TABS: 25.0000 mg | ORAL_TABLET | Freq: Every evening | ORAL | 3 refills | Status: DC | PRN

## 2023-02-19 MED ORDER — ALPRAZOLAM 0.25 MG PO TABS: 0.2500 mg | ORAL_TABLET | Freq: Three times a day (TID) | ORAL | 0 refills | Status: DC | PRN

## 2023-02-19 NOTE — Telephone Encounter (Signed)
Refill request and also notice it is to a different pharmacy NOT [facility]. Last apt 02/06/23.

## 2023-02-20 NOTE — Progress Notes (Signed)

## 2023-02-21 ENCOUNTER — Other Ambulatory Visit: Payer: Self-pay

## 2023-02-21 ENCOUNTER — Ambulatory Visit (HOSPITAL_BASED_OUTPATIENT_CLINIC_OR_DEPARTMENT_OTHER): Payer: Medicare Other | Admitting: Anesthesiology

## 2023-02-21 ENCOUNTER — Encounter (HOSPITAL_BASED_OUTPATIENT_CLINIC_OR_DEPARTMENT_OTHER)
Admission: RE | Disposition: A | Discharge status: Home / Self Care | Payer: Self-pay | Source: Home / Self Care | Attending: Surgery

## 2023-02-21 ENCOUNTER — Ambulatory Visit (HOSPITAL_COMMUNITY)
Admission: RE | Admit: 2023-02-21 | Discharge: 2023-02-22 | Disposition: A | Discharge status: Home / Self Care | Payer: Medicare Other | Attending: Surgery | Admitting: Surgery

## 2023-02-21 ENCOUNTER — Encounter (HOSPITAL_BASED_OUTPATIENT_CLINIC_OR_DEPARTMENT_OTHER): Payer: Self-pay | Admitting: Surgery

## 2023-02-21 DIAGNOSIS — Z853 Personal history of malignant neoplasm of breast: Secondary | ICD-10-CM | POA: Insufficient documentation

## 2023-02-21 DIAGNOSIS — Z923 Personal history of irradiation: Secondary | ICD-10-CM | POA: Diagnosis not present

## 2023-02-21 DIAGNOSIS — D0512 Intraductal carcinoma in situ of left breast: Secondary | ICD-10-CM | POA: Diagnosis not present

## 2023-02-21 DIAGNOSIS — M1990 Unspecified osteoarthritis, unspecified site: Secondary | ICD-10-CM | POA: Diagnosis not present

## 2023-02-21 DIAGNOSIS — M797 Fibromyalgia: Secondary | ICD-10-CM | POA: Diagnosis not present

## 2023-02-21 HISTORY — DX: Anxiety disorder, unspecified: F41.9

## 2023-02-21 HISTORY — PX: SIMPLE MASTECTOMY WITH AXILLARY SENTINEL NODE BIOPSY: SHX6098

## 2023-02-21 SURGERY — SIMPLE MASTECTOMY
Anesthesia: General | Site: Breast | Laterality: Left

## 2023-02-21 MED ORDER — LACTATED RINGERS IV SOLN: INTRAVENOUS | Status: DC

## 2023-02-21 MED ORDER — CEFAZOLIN SODIUM-DEXTROSE 2-4 GM/100ML-% IV SOLN
INTRAVENOUS | Status: AC
  Filled 2023-02-21: qty 100

## 2023-02-21 MED ORDER — LIDOCAINE 2% (20 MG/ML) 5 ML SYRINGE
INTRAMUSCULAR | Status: AC
  Filled 2023-02-21: qty 5

## 2023-02-21 MED ORDER — FENTANYL CITRATE (PF) 100 MCG/2ML IJ SOLN
INTRAMUSCULAR | Status: DC | PRN
  Administered 2023-02-21 (×4): 50 ug via INTRAVENOUS

## 2023-02-21 MED ORDER — ROPIVACAINE HCL 5 MG/ML IJ SOLN
INTRAMUSCULAR | Status: DC | PRN
  Administered 2023-02-21: 30 mL

## 2023-02-21 MED ORDER — METHOCARBAMOL 750 MG PO TABS: 750.0000 mg | ORAL_TABLET | Freq: Three times a day (TID) | ORAL | 0 refills | Status: DC | PRN

## 2023-02-21 MED ORDER — FENTANYL CITRATE (PF) 100 MCG/2ML IJ SOLN
INTRAMUSCULAR | Status: AC
  Filled 2023-02-21: qty 2

## 2023-02-21 MED ORDER — SODIUM CHLORIDE 0.9 % IV SOLN: 250.0000 mL | INTRAVENOUS | Status: DC | PRN

## 2023-02-21 MED ORDER — DEXMEDETOMIDINE HCL IN NACL 80 MCG/20ML IV SOLN
INTRAVENOUS | Status: DC | PRN
  Administered 2023-02-21: 12 ug via BUCCAL

## 2023-02-21 MED ORDER — ACETAMINOPHEN 500 MG PO TABS
ORAL_TABLET | ORAL | Status: AC
  Filled 2023-02-21: qty 2

## 2023-02-21 MED ORDER — CEFAZOLIN SODIUM-DEXTROSE 2-4 GM/100ML-% IV SOLN: 2.0000 g | INTRAVENOUS | Status: DC

## 2023-02-21 MED ORDER — SODIUM CHLORIDE 0.9 % IV SOLN
INTRAVENOUS | Status: DC | PRN
  Administered 2023-02-21: 500 mL

## 2023-02-21 MED ORDER — SODIUM CHLORIDE 0.9% FLUSH: 3.0000 mL | Freq: Two times a day (BID) | INTRAVENOUS | Status: DC

## 2023-02-21 MED ORDER — ONDANSETRON HCL 4 MG/2ML IJ SOLN: 4.0000 mg | INTRAMUSCULAR | Status: DC | PRN

## 2023-02-21 MED ORDER — PROPOFOL 500 MG/50ML IV EMUL
INTRAVENOUS | Status: DC | PRN
  Administered 2023-02-21: 35 ug/kg/min via INTRAVENOUS

## 2023-02-21 MED ORDER — ACETAMINOPHEN 325 MG PO TABS
650.0000 mg | ORAL_TABLET | ORAL | Status: DC | PRN
  Administered 2023-02-21 – 2023-02-22 (×3): 650 mg via ORAL
  Filled 2023-02-21 (×3): qty 2

## 2023-02-21 MED ORDER — ONDANSETRON HCL 4 MG/2ML IJ SOLN
INTRAMUSCULAR | Status: DC | PRN
  Administered 2023-02-21: 4 mg via INTRAVENOUS

## 2023-02-21 MED ORDER — CHLORHEXIDINE GLUCONATE CLOTH 2 % EX PADS: 6.0000 | MEDICATED_PAD | Freq: Once | CUTANEOUS | Status: DC

## 2023-02-21 MED ORDER — DEXAMETHASONE SODIUM PHOSPHATE 10 MG/ML IJ SOLN
INTRAMUSCULAR | Status: AC
  Filled 2023-02-21: qty 1

## 2023-02-21 MED ORDER — CEFAZOLIN SODIUM-DEXTROSE 2-3 GM-%(50ML) IV SOLR
INTRAVENOUS | Status: DC | PRN
  Administered 2023-02-21: 2 g via INTRAVENOUS

## 2023-02-21 MED ORDER — ACETAMINOPHEN 500 MG PO TABS
1000.0000 mg | ORAL_TABLET | ORAL | Status: AC
  Administered 2023-02-21: 1000 mg via ORAL

## 2023-02-21 MED ORDER — OXYCODONE HCL 5 MG PO TABS: 5.0000 mg | ORAL_TABLET | Freq: Four times a day (QID) | ORAL | 0 refills | Status: DC | PRN

## 2023-02-21 MED ORDER — ACETAMINOPHEN 325 MG RE SUPP: 650.0000 mg | RECTAL | Status: DC | PRN

## 2023-02-21 MED ORDER — LACTATED RINGERS IV SOLN: INTRAVENOUS | Status: DC | PRN

## 2023-02-21 MED ORDER — FENTANYL CITRATE (PF) 100 MCG/2ML IJ SOLN
25.0000 ug | INTRAMUSCULAR | Status: DC | PRN
  Administered 2023-02-21: 50 ug via INTRAVENOUS

## 2023-02-21 MED ORDER — FENTANYL CITRATE (PF) 100 MCG/2ML IJ SOLN
50.0000 ug | Freq: Once | INTRAMUSCULAR | Status: AC
  Administered 2023-02-21: 50 ug via INTRAVENOUS

## 2023-02-21 MED ORDER — TRANEXAMIC ACID 1000 MG/10ML IV SOLN
INTRAVENOUS | Status: AC
  Filled 2023-02-21: qty 20

## 2023-02-21 MED ORDER — MIDAZOLAM HCL 2 MG/2ML IJ SOLN
INTRAMUSCULAR | Status: AC
  Filled 2023-02-21: qty 2

## 2023-02-21 MED ORDER — ONDANSETRON HCL 4 MG/2ML IJ SOLN
INTRAMUSCULAR | Status: AC
  Filled 2023-02-21: qty 2

## 2023-02-21 MED ORDER — MIDAZOLAM HCL 2 MG/2ML IJ SOLN
1.0000 mg | Freq: Once | INTRAMUSCULAR | Status: AC
  Administered 2023-02-21: 1 mg via INTRAVENOUS

## 2023-02-21 MED ORDER — OXYCODONE HCL 5 MG/5ML PO SOLN: 5.0000 mg | Freq: Once | ORAL | Status: AC | PRN

## 2023-02-21 MED ORDER — MORPHINE SULFATE (PF) 4 MG/ML IV SOLN: 1.0000 mg | INTRAVENOUS | Status: DC | PRN

## 2023-02-21 MED ORDER — TRANEXAMIC ACID 1000 MG/10ML IV SOLN
INTRAVENOUS | Status: DC | PRN
  Administered 2023-02-21: 2000 mg via TOPICAL

## 2023-02-21 MED ORDER — PROPOFOL 10 MG/ML IV BOLUS
INTRAVENOUS | Status: AC
  Filled 2023-02-21: qty 20

## 2023-02-21 MED ORDER — LIDOCAINE HCL (CARDIAC) PF 100 MG/5ML IV SOSY
PREFILLED_SYRINGE | INTRAVENOUS | Status: DC | PRN
  Administered 2023-02-21: 60 mg via INTRAVENOUS

## 2023-02-21 MED ORDER — SODIUM CHLORIDE 0.9% FLUSH: 3.0000 mL | INTRAVENOUS | Status: DC | PRN

## 2023-02-21 MED ORDER — OXYCODONE HCL 5 MG PO TABS
5.0000 mg | ORAL_TABLET | ORAL | Status: DC | PRN
  Administered 2023-02-21 – 2023-02-22 (×3): 5 mg via ORAL
  Filled 2023-02-21: qty 2
  Filled 2023-02-21 (×3): qty 1

## 2023-02-21 MED ORDER — METHOCARBAMOL 500 MG PO TABS
500.0000 mg | ORAL_TABLET | Freq: Four times a day (QID) | ORAL | Status: DC | PRN
  Administered 2023-02-21 – 2023-02-22 (×3): 500 mg via ORAL
  Filled 2023-02-21 (×3): qty 1

## 2023-02-21 MED ORDER — DEXAMETHASONE SODIUM PHOSPHATE 10 MG/ML IJ SOLN
INTRAMUSCULAR | Status: DC | PRN
  Administered 2023-02-21: 10 mg via INTRAVENOUS

## 2023-02-21 MED ORDER — PROPOFOL 10 MG/ML IV BOLUS
INTRAVENOUS | Status: DC | PRN
  Administered 2023-02-21: 150 mg via INTRAVENOUS

## 2023-02-21 MED ORDER — DEXTROSE-NACL 5-0.9 % IV SOLN: INTRAVENOUS | Status: DC

## 2023-02-21 MED ORDER — OXYCODONE HCL 5 MG PO TABS
5.0000 mg | ORAL_TABLET | Freq: Once | ORAL | Status: AC | PRN
  Administered 2023-02-21: 5 mg via ORAL
  Filled 2023-02-21: qty 1

## 2023-02-21 MED ORDER — MAGTRACE LYMPHATIC TRACER
INTRAMUSCULAR | Status: DC | PRN
  Administered 2023-02-21: 2 mL via INTRAMUSCULAR

## 2023-02-21 MED ORDER — AMISULPRIDE (ANTIEMETIC) 5 MG/2ML IV SOLN: 10.0000 mg | Freq: Once | INTRAVENOUS | Status: DC | PRN

## 2023-02-21 SURGICAL SUPPLY — 77 items
ADH SKN CLS APL DERMABOND .7 (GAUZE/BANDAGES/DRESSINGS) ×2
APL PRP STRL LF DISP 70% ISPRP (MISCELLANEOUS) ×1
APL SKNCLS STERI-STRIP NONHPOA (GAUZE/BANDAGES/DRESSINGS)
APPLIER CLIP 11 MED OPEN (CLIP)
APPLIER CLIP 9.375 MED OPEN (MISCELLANEOUS) ×1
APR CLP MED 11 20 MLT OPN (CLIP)
APR CLP MED 9.3 20 MLT OPN (MISCELLANEOUS) ×1
BENZOIN TINCTURE PRP APPL 2/3 (GAUZE/BANDAGES/DRESSINGS) IMPLANT
BINDER BREAST LRG (GAUZE/BANDAGES/DRESSINGS) IMPLANT
BINDER BREAST MEDIUM (GAUZE/BANDAGES/DRESSINGS) IMPLANT
BINDER BREAST XLRG (GAUZE/BANDAGES/DRESSINGS) IMPLANT
BINDER BREAST XXLRG (GAUZE/BANDAGES/DRESSINGS) IMPLANT
BIOPATCH RED 1 DISK 7.0 (GAUZE/BANDAGES/DRESSINGS) IMPLANT
BLADE HEX COATED 2.75 (ELECTRODE) ×2 IMPLANT
BLADE SURG 10 STRL SS (BLADE) ×2 IMPLANT
BLADE SURG 15 STRL LF DISP TIS (BLADE) ×2 IMPLANT
BLADE SURG 15 STRL SS (BLADE) ×1
BNDG CMPR 5X62 HK CLSR LF (GAUZE/BANDAGES/DRESSINGS)
BNDG CMPR 6"X 5 YARDS HK CLSR (GAUZE/BANDAGES/DRESSINGS)
BNDG ELASTIC 6INX 5YD STR LF (GAUZE/BANDAGES/DRESSINGS) ×2 IMPLANT
CANISTER SUCT 1200ML W/VALVE (MISCELLANEOUS) ×2 IMPLANT
CHLORAPREP W/TINT 26 (MISCELLANEOUS) ×2 IMPLANT
CLIP APPLIE 11 MED OPEN (CLIP) IMPLANT
CLIP APPLIE 9.375 MED OPEN (MISCELLANEOUS) IMPLANT
COVER BACK TABLE 60X90IN (DRAPES) ×2 IMPLANT
COVER MAYO STAND STRL (DRAPES) ×2 IMPLANT
COVER PROBE CYLINDRICAL 5X96 (MISCELLANEOUS) ×2 IMPLANT
DERMABOND ADVANCED .7 DNX12 (GAUZE/BANDAGES/DRESSINGS) ×4 IMPLANT
DRAIN CHANNEL 19F RND (DRAIN) ×2 IMPLANT
DRAPE LAPAROSCOPIC ABDOMINAL (DRAPES) ×2 IMPLANT
DRAPE UTILITY XL STRL (DRAPES) ×2 IMPLANT
DRSG TEGADERM 2-3/8X2-3/4 SM (GAUZE/BANDAGES/DRESSINGS) IMPLANT
DRSG TEGADERM 4X10 (GAUZE/BANDAGES/DRESSINGS) ×2 IMPLANT
ELECT COATED BLADE 2.86 ST (ELECTRODE) ×2 IMPLANT
ELECT REM PT RETURN 9FT ADLT (ELECTROSURGICAL) ×1
ELECTRODE REM PT RTRN 9FT ADLT (ELECTROSURGICAL) ×2 IMPLANT
EVACUATOR SILICONE 100CC (DRAIN) ×2 IMPLANT
GAUZE PAD ABD 8X10 STRL (GAUZE/BANDAGES/DRESSINGS) IMPLANT
GAUZE SPONGE 4X4 12PLY STRL LF (GAUZE/BANDAGES/DRESSINGS) IMPLANT
GLOVE BIOGEL PI IND STRL 7.0 (GLOVE) IMPLANT
GLOVE BIOGEL PI IND STRL 7.5 (GLOVE) IMPLANT
GLOVE BIOGEL PI IND STRL 8 (GLOVE) ×2 IMPLANT
GLOVE ECLIPSE 8.0 STRL XLNG CF (GLOVE) ×2 IMPLANT
GLOVE SURG SS PI 6.5 STRL IVOR (GLOVE) IMPLANT
GLOVE SURG SS PI 7.0 STRL IVOR (GLOVE) IMPLANT
GOWN STRL REUS W/ TWL LRG LVL3 (GOWN DISPOSABLE) ×4 IMPLANT
GOWN STRL REUS W/ TWL XL LVL3 (GOWN DISPOSABLE) ×2 IMPLANT
GOWN STRL REUS W/TWL LRG LVL3 (GOWN DISPOSABLE) ×2
GOWN STRL REUS W/TWL XL LVL3 (GOWN DISPOSABLE) ×1
NDL HYPO 25X1 1.5 SAFETY (NEEDLE) ×4 IMPLANT
NDL SAFETY ECLIP 18X1.5 (MISCELLANEOUS) ×2 IMPLANT
NEEDLE HYPO 25X1 1.5 SAFETY (NEEDLE) ×1 IMPLANT
NS IRRIG 1000ML POUR BTL (IV SOLUTION) IMPLANT
PACK BASIN DAY SURGERY FS (CUSTOM PROCEDURE TRAY) ×2 IMPLANT
PENCIL SMOKE EVACUATOR (MISCELLANEOUS) ×2 IMPLANT
PIN SAFETY STERILE (MISCELLANEOUS) ×2 IMPLANT
SLEEVE SCD COMPRESS KNEE MED (STOCKING) ×2 IMPLANT
SPIKE FLUID TRANSFER (MISCELLANEOUS) IMPLANT
SPONGE T-LAP 18X18 ~~LOC~~+RFID (SPONGE) ×2 IMPLANT
SPONGE T-LAP 4X18 ~~LOC~~+RFID (SPONGE) IMPLANT
STAPLER VISISTAT 35W (STAPLE) ×2 IMPLANT
STRIP CLOSURE SKIN 1/2X4 (GAUZE/BANDAGES/DRESSINGS) IMPLANT
SUT CHROMIC 3 0 SH 27 (SUTURE) IMPLANT
SUT ETHILON 2 0 FS 18 (SUTURE) ×2 IMPLANT
SUT MNCRL AB 3-0 PS2 18 (SUTURE) ×2 IMPLANT
SUT MON AB 4-0 PC3 18 (SUTURE) IMPLANT
SUT SILK 2 0 PERMA HAND 18 BK (SUTURE) ×2 IMPLANT
SUT SILK 2 0 SH (SUTURE) IMPLANT
SUT VIC AB 3-0 54X BRD REEL (SUTURE) ×2 IMPLANT
SUT VIC AB 3-0 BRD 54 (SUTURE) ×1
SUT VICRYL 3-0 CR8 SH (SUTURE) ×4 IMPLANT
SYR CONTROL 10ML LL (SYRINGE) ×4 IMPLANT
TOWEL GREEN STERILE FF (TOWEL DISPOSABLE) ×4 IMPLANT
TRACER MAGTRACE VIAL (MISCELLANEOUS) IMPLANT
TRAY FAXITRON CT DISP (TRAY / TRAY PROCEDURE) ×2 IMPLANT
TUBE CONNECTING 20X1/4 (TUBING) ×2 IMPLANT
YANKAUER SUCT BULB TIP NO VENT (SUCTIONS) ×2 IMPLANT

## 2023-02-21 NOTE — H&P (Signed)
Chief Complaint: New Consultation and Breast Cancer  History of Present Illness: Savannah [name] is a 67 y.o. female who is seen today as an office consultation for evaluation of New Consultation and Breast Cancer  Patient presents for evaluation of left breast microcalcifications in the upper outer quadrant core biopsy proven to be DCIS. History of right breast cancer treated in 2008 with breast conserving surgery. She also radiation therapy on the right. She has developed a cluster of left breast microcalcifications noted on screening mammogram. She underwent diagnostic mammography and core biopsy which showed DCIS. The area measures about 5 and half centimeters. No history of breast pain nipple discharge or other physical finding.  Review of Systems: A complete review of systems was obtained from the patient. I have reviewed this information and discussed as appropriate with the patient. See HPI as well for other ROS.    Medical History: Past Medical History:  Diagnosis Date  Anxiety  Arthritis  GERD (gastroesophageal reflux disease)  History of cancer  Hyperlipidemia  Thyroid disease   There is no problem list on file for this patient.  Past Surgical History:  Procedure Laterality Date  HYSTERECTOMY  MASTECTOMY PARTIAL / LUMPECTOMY    Allergies  Allergen Reactions  Propoxyphene Itching, Nausea and Other (See Comments)  Tramadol Hcl Itching   Current Outpatient Medications on File Prior to Visit  Medication Sig Dispense Refill  atorvastatin (LIPITOR) 10 MG tablet Take 1 tablet by mouth at bedtime  traZODone (DESYREL) 50 MG tablet  traMADoL (ULTRAM) 50 mg tablet   No current facility-administered medications on file prior to visit.   Family History  Problem Relation Age of Onset  Stroke Mother  Obesity Mother  High blood pressure (Hypertension) Mother  Colon cancer Mother  Obesity Father  High blood pressure (Hypertension) Father  Coronary Artery Disease (Blocked  arteries around heart) Father    Social History   Tobacco Use  Smoking Status Never  Smokeless Tobacco Never    Social History   Socioeconomic History  Marital status: Single  Tobacco Use  Smoking status: Never  Smokeless tobacco: Never  Substance and Sexual Activity  Alcohol use: Not Currently  Comment: 5x a week  Drug use: Never   Objective:   Vitals:  01/03/23 1003  BP: 130/72  Pulse: 89  Temp: 36.7 C (98 F)  SpO2: 97%  Weight: 77.1 kg (170 lb)  Height: 167.6 cm (5\' 6" )   Body mass index is 27.44 kg/m.  Physical Exam Exam conducted with a chaperone present.  HENT:  Head: Normocephalic.  Cardiovascular:  Rate and Rhythm: Normal rate.  Pulmonary:  Effort: Pulmonary effort is normal.  Breath sounds: No stridor.  Chest:  Breasts: Right: No mass.  Left: Swelling present. No mass.   Comments: Right breast surgery changes noted. No masses. Postradiation changes noted on the right. Left breast shows bruising from biopsy. No masses. Small hematoma noted. Musculoskeletal:  General: Normal range of motion.  Cervical back: Normal range of motion.  Lymphadenopathy:  Upper Body:  Right upper body: No supraclavicular or axillary adenopathy.  Left upper body: No supraclavicular or axillary adenopathy.  Skin: General: Skin is warm.  Neurological:  General: No focal deficit present.  Mental Status: She is alert.  Psychiatric:  Mood and Affect: Mood normal.  Behavior: Behavior normal.     Labs, Imaging and Diagnostic Testing:  Diagnosis 1. Breast, left, needle core biopsy, upper outer (X) DUCTAL CARCINOMA IN SITU, CRIBRIFORM, INTERMEDIATE NUCLEAR GRADE NECROSIS: PRESENT CALCIFICATIONS: PRESENT  DCIS LENGTH: 0.4 CM 2. Breast, left, needle core biopsy, lower outer (coil) DUCTAL CARCINOMA IN SITU, CRIBRIFORM, INTERMEDIATE TO HIGH NUCLEAR GRADE NECROSIS: PRESENT CALCIFICATIONS: PRESENT DCIS LENGTH: 1.1 CM  CLINICAL DATA: Patient returns after screening  study for evaluation of LEFT breast calcifications. History of RIGHT lumpectomy with radiation treatment in 2008.  EXAM: DIGITAL DIAGNOSTIC UNILATERAL LEFT MAMMOGRAM WITH CAD  TECHNIQUE: Left digital diagnostic mammography was performed.  COMPARISON: Previous exam(s).  ACR Breast Density Category b: There are scattered areas of fibroglandular density.  FINDINGS: Magnified views are performed of calcifications in the UPPER-OUTER QUADRANT of the LEFT breast. These views show faint pleomorphic calcifications, some in a linear distribution in the [place]. Calcifications span 4.5 x 5.5 x 3.7 centimeters.  IMPRESSION: Indeterminate 5.5 centimeter group of calcifications in the UPPER-OUTER QUADRANT of the LEFT breast warranting tissue diagnosis.  RECOMMENDATION: Recommend 2 site stereotactic biopsy of LEFT breast calcifications.  I have discussed the findings and recommendations with the patient. If applicable, a reminder letter will be sent to the patient regarding the next appointment.  BI-RADS CATEGORY 4: Suspicious.   Electronically Signed By: Nolon Nations M.D. On: 12/11/2022 16:27  Assessment and Plan:   Diagnoses and all orders for this visit:  Ductal carcinoma in situ of left breast - Ambulatory Referral to Oncology-Medical - Ambulatory Referral to Radiation Oncology  Other orders - diclofenac (VOLTAREN) 1 % topical gel; Apply 2 g topically 4 (four) times daily - lidocaine (XYLOCAINE) 2 % jelly; Apply topically once daily as needed   Discussed breast conserving surgery with possible radiation. Discussed role of sentinel lymph node mapping and DCIS that this can be done at a later time. Reviewed the data on breast conserving surgery versus mastectomy  with or without  reconstruction as well as given her history of having on the right as well. She has chosen mastectomy with mag trace injection at this point after considering breast conservation. The  surgical and non surgical options have been discussed with the patient.  Risks of surgery include bleeding,  Infection,  Flap necrosis,  Tissue loss,  Chronic pain, death, Numbness,  And the need for additional procedures.  Reconstruction options also have been discussed with the patient as well.   Plan on mag trace injection as well for future SLN mapping if needed . The patient agrees to proceed.  Kennieth Francois, MD

## 2023-02-21 NOTE — Progress Notes (Signed)
Assisted Dr. Greg Stoltzfus with left, pectoralis, ultrasound guided block. Side rails up, monitors on throughout procedure. See vital signs in flow sheet. Tolerated Procedure well. 

## 2023-02-21 NOTE — Discharge Instructions (Addendum)
[facility] [place] [phone]  MASTECTOMY: POST OP INSTRUCTIONS  Always review your discharge instruction sheet given to you by the facility where your surgery was performed. IF YOU HAVE DISABILITY OR FAMILY LEAVE FORMS, YOU MUST BRING THEM TO THE OFFICE FOR PROCESSING.   DO NOT GIVE THEM TO YOUR DOCTOR. A prescription for pain medication may be given to you upon discharge.  Take your pain medication as prescribed, if needed.  If narcotic pain medicine is not needed, then you may take acetaminophen (Tylenol) or ibuprofen (Advil) as needed. Take your usually prescribed medications unless otherwise directed. If you need a refill on your pain medication, please contact your pharmacy.  They will contact our office to request authorization.  Prescriptions will not be filled after 5pm or on week-ends. You should follow a light diet the first few days after arrival home, such as soup and crackers, etc.  Resume your normal diet the day after surgery. Most patients will experience some swelling and bruising on the chest and underarm.  Ice packs will help.  Swelling and bruising can take several days to resolve.  It is common to experience some constipation if taking pain medication after surgery.  Increasing fluid intake and taking a stool softener (such as Colace) will usually help or prevent this problem from occurring.  A mild laxative (Milk of Magnesia or Miralax) should be taken according to package instructions if there are no bowel movements after 48 hours. Unless discharge instructions indicate otherwise, leave your bandage dry and in place until your next appointment in 3-5 days.  You may take a limited sponge bath.  No tube baths or showers until the drains are removed.  You may have steri-strips (small skin tapes) in place directly over the incision.  These strips should be left on the skin for 7-10 days.  If your surgeon used skin glue on the incision, you may shower in 24 hours.   The glue will flake off over the next 2-3 weeks.  Any sutures or staples will be removed at the office during your follow-up visit. DRAINS:  If you have drains in place, it is important to keep a list of the amount of drainage produced each day in your drains.  Before leaving the hospital, you should be instructed on drain care.  Call our office if you have any questions about your drains. ACTIVITIES:  You may resume regular (light) daily activities beginning the next day--such as daily self-care, walking, climbing stairs--gradually increasing activities as tolerated.  You may have sexual intercourse when it is comfortable.  Refrain from any heavy lifting or straining until approved by your doctor. You may drive when you are no longer taking prescription pain medication, you can comfortably wear a seatbelt, and you can safely maneuver your car and apply brakes. RETURN TO WORK:  __________________________________________________________ You should see your doctor in the office for a follow-up appointment approximately 3-5 days after your surgery.  Your doctor's nurse will typically make your follow-up appointment when she calls you with your pathology report.  Expect your pathology report 2-3 business days after your surgery.  You may call to check if you do not hear from us after three days.   OTHER INSTRUCTIONS: ______________________________________________________________________________________________ ____________________________________________________________________________________________ WHEN TO CALL YOUR DOCTOR: Fever over 101.0 Nausea and/or vomiting Extreme swelling or bruising Continued bleeding from incision. Increased pain, redness, or drainage from the incision. The clinic staff is available to answer your questions during regular business hours.  Please don't hesitate   to call and ask to speak to one of the nurses for clinical concerns.  If you have a medical emergency, go to the  nearest emergency room or call 911.  A surgeon from [facility] is always on call at the hospital. [address], [address], [place], [place]  [address] ? [address] [address], [place], [place]   [address] [phone] ? [phone] ? FAX [phone] Web site: [email or web address]  About my Jackson-Pratt Bulb Drain  What is a Jackson-Pratt bulb? A Jackson-Pratt is a soft, round device used to collect drainage. It is connected to a long, thin drainage catheter, which is held in place by one or two small stiches near your surgical incision site. When the bulb is squeezed, it forms a vacuum, forcing the drainage to empty into the bulb.  Emptying the Jackson-Pratt bulb- To empty the bulb: 1. Release the plug on the top of the bulb. 2. Pour the bulb's contents into a measuring container which your nurse will provide. 3. Record the time emptied and amount of drainage. Empty the drain(s) as often as your     doctor or nurse recommends.  Date                  Time                    Amount (Drain 1)                   _____________________________________________________________________  _____________________________________________________________________  _____________________________________________________________________  _____________________________________________________________________  _____________________________________________________________________  _____________________________________________________________________  _____________________________________________________________________  _____________________________________________________________________  Squeezing the Jackson-Pratt Bulb- To squeeze the bulb: 1. Make sure the plug at the top of the bulb is open. 2. Squeeze the bulb tightly in your fist. You will hear air squeezing from the bulb. 3. Replace the plug while the bulb is squeezed. 4. Use a safety pin to attach the bulb to your clothing. This will keep  the catheter from     pulling at the bulb insertion site.  When to call your doctor- Call your doctor if: Drain site becomes red, swollen or hot. You have a fever greater than 101 degrees F. There is oozing at the drain site. Drain falls out (apply a guaze bandage over the drain hole and secure it with tape). Drainage increases daily not related to activity patterns. (You will usually have more drainage when you are active than when you are resting.) Drainage has a bad odor.   

## 2023-02-21 NOTE — Op Note (Signed)
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  She has a diagnosis of DCIS underwent breast conserving surgery.  Unfortunate all of her margins were involved.  We discussed reexcision with her but she wished to proceed with left simple mastectomy without reconstruction.  We reviewed the pros and cons of this approach as well as long-term expectations, local regional recurrence and potential additional adjuvant therapy required.The surgical and non surgical options have been discussed with the patient.  Risks of surgery include bleeding,  Infection,  Flap necrosis,  Tissue loss,  Chronic pain, death, Numbness,  And the need for additional procedures.  Reconstruction options also have been discussed with the patient as well.  The patient agrees to proceed.   Description of procedure: The patient was met in the holding area and questions were answered.  The left breast was marked as the correct site.  Her wound was intact from her previous lumpectomy.  All questions were answered and a pectoral block was administered by anesthesia.  She was then taken back to the operating room.  She was placed supine upon the operating room table.  After induction of general anesthesia, the left breast was prepped with alcohol and 2 cc of MAC tracer injected.  We then prepped and draped her left breast a second time and had an additional timeout to verify proper patient, site and procedure.  Curvilinear incisions were made above and below the nipple areolar complex in a fishmouth configuration.  The superior skin flap was taken to the clavicle.  The inferior skin flap was taken to the  inframammary fold.  The medial skin flap was taken to the sternum and then the lateral skin flap was taken to the lateralmost attachments of the breast.  The breast was then dissected off the pectoralis major muscle in a medial to lateral fashion taking the fascia with it.  Once the lateral attachments were identified they were divided.  Of note I took some skeletal muscle from below the lumpectomy cavity which corresponded to the deep margin.  The muscle fibers are at the deep margin of this cavity.  The specimen is oriented with ink.  Irrigation was used.  Hemostasis was achieved with cautery.  TXA 2 g was diluted with 50 cc of saline.  This was soaked in sponges and placed in the flaps for 5 minutes with pressure being held.  After this it was inspected again for hemostasis and this was excellent.  Through a separate stab incision a 19 round drain was placed and secured to the skin with 2-0 nylon.  It was then closed with 3-0 Vicryl and 4-0 Monocryl.  Dermabond applied.  Breast binder placed.  All counts found to be correct.  The patient was then awoke extubated taken to recovery in satisfactory condition.

## 2023-02-21 NOTE — Anesthesia Procedure Notes (Signed)
Anesthesia Regional Block: Pectoralis block   Pre-Anesthetic Checklist: , timeout performed,  Correct Patient, Correct Site, Correct Laterality,  Correct Procedure, Correct Position, site marked,  Risks and benefits discussed,  Surgical consent,  Pre-op evaluation,  At surgeon's request and post-op pain management  Laterality: Left  Prep: Dura Prep       Needles:  Injection technique: Single-shot  Needle Type: Echogenic Stimulator Needle     Needle Length: 5cm  Needle Gauge: 20     Additional Needles:   Procedures:,,,, ultrasound used (permanent image in chart),,    Narrative:  Start time: 02/21/2023 9:42 AM End time: 02/21/2023 9:47 AM Injection made incrementally with aspirations every 5 mL.  Performed by: Personally  Anesthesiologist: Darral Dash, DO  Additional Notes: Patient identified. Risks/Benefits/Options discussed with patient including but not limited to bleeding, infection, nerve damage, failed block, incomplete pain control. Patient expressed understanding and wished to proceed. All questions were answered. Sterile technique was used throughout the entire procedure. Please see nursing notes for vital signs. Aspirated in 5cc intervals with injection for negative confirmation. Patient was given instructions on fall risk and not to get out of bed. All questions and concerns addressed with instructions to call with any issues or inadequate analgesia.

## 2023-02-21 NOTE — Anesthesia Postprocedure Evaluation (Signed)
Anesthesia Post Note  Patient: Savannah [name]  Procedure(s) Performed: LEFT SIMPLE MASTECTOMY (Left: Breast)     Patient location during evaluation: PACU Anesthesia Type: General and Regional Level of consciousness: awake and alert Pain management: pain level controlled Vital Signs Assessment: post-procedure vital signs reviewed and stable Respiratory status: spontaneous breathing, nonlabored ventilation, respiratory function stable and patient connected to nasal cannula oxygen Cardiovascular status: blood pressure returned to baseline and stable Postop Assessment: no apparent nausea or vomiting Anesthetic complications: no   No notable events documented.  Last Vitals:  Vitals:   02/21/23 1300 02/21/23 1330  BP: (!) 166/66 119/65  Pulse: 68 80  Resp: 15 16  Temp:  (!) 36.3 C  SpO2: 97% 97%    Last Pain:  Vitals:   02/21/23 1330  TempSrc:   PainSc: 5                  Brittley Regner P Martez Weiand

## 2023-02-21 NOTE — Anesthesia Preprocedure Evaluation (Signed)
Anesthesia Evaluation  Patient identified by MRN, date of birth, ID band Patient awake    Reviewed: Allergy & Precautions, NPO status , Patient's Chart, lab work & pertinent test results  History of Anesthesia Complications (+) PONV and history of anesthetic complications  Airway Mallampati: II  TM Distance: >3 FB Neck ROM: Full    Dental no notable dental hx.    Pulmonary neg pulmonary ROS   Pulmonary exam normal        Cardiovascular negative cardio ROS  Rhythm:Regular Rate:Normal     Neuro/Psych   Anxiety     negative neurological ROS     GI/Hepatic Neg liver ROS,GERD  ,,  Endo/Other  negative endocrine ROS    Renal/GU negative Renal ROS  negative genitourinary   Musculoskeletal  (+) Arthritis ,  Fibromyalgia -Left breast DCIS    Abdominal Normal abdominal exam  (+)   Peds  Hematology negative hematology ROS (+)   Anesthesia Other Findings   Reproductive/Obstetrics                             Anesthesia Physical Anesthesia Plan  ASA: 2  Anesthesia Plan: General   Post-op Pain Management: Tylenol PO (pre-op)*   Induction: Intravenous  PONV Risk Score and Plan: 4 or greater and Ondansetron, Dexamethasone and Treatment may vary due to age or medical condition  Airway Management Planned: Mask and LMA  Additional Equipment: None  Intra-op Plan:   Post-operative Plan: Extubation in OR  Informed Consent: I have reviewed the patients History and Physical, chart, labs and discussed the procedure including the risks, benefits and alternatives for the proposed anesthesia with the patient or authorized representative who has indicated his/her understanding and acceptance.     Dental advisory given  Plan Discussed with: CRNA  Anesthesia Plan Comments:        Anesthesia Quick Evaluation

## 2023-02-21 NOTE — Interval H&P Note (Signed)
History and Physical Interval Note:  02/21/2023 10:23 AM  Savannah [name]  has presented today for surgery, with the diagnosis of LEFT BREAST DCIS.  The various methods of treatment have been discussed with the patient and family. After consideration of risks, benefits and other options for treatment, the patient has consented to  Procedure(s): LEFT SIMPLE MASTECTOMY (Left) as a surgical intervention.  The patient's history has been reviewed, patient examined, no change in status, stable for surgery.  I have reviewed the patient's chart and labs.  Questions were answered to the patient's satisfaction.    The surgical and non surgical options have been discussed with the patient.  Risks of surgery include bleeding,  Infection,  Flap necrosis,  Tissue loss,  Chronic pain, death, Numbness,  And the need for additional procedures.  Reconstruction options also have been discussed with the patient as well.  The patient agrees to proceed.  [place]

## 2023-02-21 NOTE — Anesthesia Procedure Notes (Signed)
Procedure Name: LMA Insertion Date/Time: 02/21/2023 11:03 AM  Performed by: Verita Lamb, CRNAPre-anesthesia Checklist: Patient identified, Emergency Drugs available, Suction available and Patient being monitored Patient Re-evaluated:Patient Re-evaluated prior to induction Oxygen Delivery Method: Circle system utilized Preoxygenation: Pre-oxygenation with 100% oxygen Induction Type: IV induction Ventilation: Mask ventilation without difficulty LMA: LMA inserted LMA Size: 4.0 Number of attempts: 1 Airway Equipment and Method: Bite block Placement Confirmation: positive ETCO2, CO2 detector and breath sounds checked- equal and bilateral Tube secured with: Tape Dental Injury: Teeth and Oropharynx as per pre-operative assessment

## 2023-02-21 NOTE — Transfer of Care (Signed)
Immediate Anesthesia Transfer of Care Note  Patient: Savannah [name]  Procedure(s) Performed: LEFT SIMPLE MASTECTOMY (Left: Breast)  Patient Location: PACU  Anesthesia Type:General and Regional  Level of Consciousness: awake, alert , and patient cooperative  Airway & Oxygen Therapy: Patient Spontanous Breathing and Patient connected to face mask oxygen  Post-op Assessment: Report given to RN and Post -op Vital signs reviewed and stable  Post vital signs: Reviewed and stable  Last Vitals:  Vitals Value Taken Time  BP 165/61 02/21/23 1228  Temp    Pulse 78 02/21/23 1230  Resp 10 02/21/23 1230  SpO2 98 % 02/21/23 1230  Vitals shown include unvalidated device data.  Last Pain:  Vitals:   02/21/23 0853  TempSrc: Oral  PainSc: 0-No pain         Complications: No notable events documented.

## 2023-02-22 DIAGNOSIS — D0512 Intraductal carcinoma in situ of left breast: Secondary | ICD-10-CM | POA: Diagnosis not present

## 2023-02-24 ENCOUNTER — Encounter (HOSPITAL_BASED_OUTPATIENT_CLINIC_OR_DEPARTMENT_OTHER): Payer: Self-pay | Admitting: Surgery

## 2023-02-24 LAB — SURGICAL PATHOLOGY

## 2023-02-25 ENCOUNTER — Encounter: Payer: Self-pay | Admitting: Surgery

## 2023-02-27 ENCOUNTER — Encounter: Payer: Self-pay | Admitting: *Deleted

## 2023-03-04 ENCOUNTER — Ambulatory Visit: Payer: Medicare Other | Attending: Surgery | Admitting: Rehabilitation

## 2023-03-04 ENCOUNTER — Encounter: Payer: Self-pay | Admitting: Rehabilitation

## 2023-03-04 ENCOUNTER — Other Ambulatory Visit: Payer: Self-pay

## 2023-03-04 DIAGNOSIS — Z483 Aftercare following surgery for neoplasm: Secondary | ICD-10-CM | POA: Diagnosis present

## 2023-03-04 DIAGNOSIS — I890 Lymphedema, not elsewhere classified: Secondary | ICD-10-CM | POA: Diagnosis present

## 2023-03-04 DIAGNOSIS — D0512 Intraductal carcinoma in situ of left breast: Secondary | ICD-10-CM

## 2023-03-04 NOTE — Therapy (Signed)
OUTPATIENT PHYSICAL THERAPY  UPPER EXTREMITY ONCOLOGY EVALUATION  Patient Name: Savannah [name] MRN: [id number] DOB:Apr 06, 1956, 67 y.o., female Today's Date: 03/04/2023  END OF SESSION:  PT End of Session - 03/04/23 1136     Visit Number 1    Number of Visits 4    Date for PT Re-Evaluation 04/01/23    PT Start Time 1100    PT Stop Time 1137    PT Time Calculation (min) 37 min    Activity Tolerance Patient tolerated treatment well    Behavior During Therapy [facility] for tasks assessed/performed             Past Medical History:  Diagnosis Date   Adhesive capsulitis    Anxiety    BPPV (benign paroxysmal positional vertigo)    Breast cancer 2008   Endometriosis    Fibromyalgia    GERD (gastroesophageal reflux disease)    Hyperlipidemia    Lymphedema    Malaria 1996   Personal history of radiation therapy 2008   PONV (postoperative nausea and vomiting)    Shingles 2002   Past Surgical History:  Procedure Laterality Date   BILATERAL SALPINGOOPHORECTOMY     BREAST BIOPSY Left 12/27/2022   MM LT BREAST BX W LOC DEV 1ST LESION IMAGE BX SPEC STEREO GUIDE 12/27/2022 [facility]   BREAST BIOPSY Left 12/27/2022   MM LT BREAST BX W LOC DEV EA AD LESION IMG BX SPEC STEREO GUIDE 12/27/2022 [facility]   BREAST BIOPSY  01/27/2023   MM LT RADIOACTIVE SEED LOC MAMMO GUIDE 01/27/2023 [facility]   BREAST BIOPSY  01/27/2023   MM LT RADIOACTIVE SEED EA ADD LESION LOC MAMMO GUIDE 01/27/2023 [facility]   BREAST LUMPECTOMY Right 2008   with right axillary node resection   BREAST LUMPECTOMY WITH RADIOACTIVE SEED LOCALIZATION Left 01/28/2023   Procedure: LEFT BREAST BRACKETED LUMPECTOMY WITH RADIOACTIVE SEED LOCALIZATION;  Surgeon: Erroll Luna, MD;  Location: [place];  Service: General;  Laterality: Left;   CHOLECYSTECTOMY     LAPAROSCOPY     MASS EXCISION Right 04/30/2018   Procedure: EXCISION CYST DEBRIDEMENT INTERPHALANGEAL JOINT RIGHT THUMB;   Surgeon: Daryll Brod, MD;  Location: [place];  Service: Orthopedics;  Laterality: Right;   SIMPLE MASTECTOMY WITH AXILLARY SENTINEL NODE BIOPSY Left 02/21/2023   Procedure: LEFT SIMPLE MASTECTOMY;  Surgeon: Erroll Luna, MD;  Location: [place];  Service: General;  Laterality: Left;   TONSILLECTOMY     TOTAL KNEE ARTHROPLASTY Bilateral 07/2018, 10/2018   VAGINAL HYSTERECTOMY     Patient Active Problem List   Diagnosis Date Noted   COVID-19 02/06/2023   Ductal carcinoma in situ (DCIS) of left breast 01/07/2023   Encntr for general adult medical exam w/o abnormal findings 11/26/2022   Anxiety 11/26/2022   Arthritis 11/14/2022   Hyperlipidemia 11/14/2022   Irritable bowel syndrome 11/14/2022   Throat fullness 11/14/2022   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    PCP: Jacolyn Reedy, NP  REFERRING PROVIDER: Dr. Brantley Stage  REFERRING DIAG: Z90.12 (ICD-10-CM) - Acquired absence of left breast and nipple   THERAPY DIAG:  Ductal carcinoma in situ (DCIS) of left breast  Lymphedema of right arm  Aftercare following surgery for neoplasm  ONSET DATE: 01/28/23  Rationale for Evaluation and Treatment: Rehabilitation  SUBJECTIVE:  SUBJECTIVE STATEMENT: I just had my drain removed yesterday.  Above my binder I feel a bit swollen.  I am going to [employer] to get a compression bra. I am wearing my binder.  I think I am having an allergic reaction to the glue.    PERTINENT HISTORY: Left lumpectomy on 01/28/23 and then Left simple mastectomy on 02/21/23 due to DCIS. No nodes examined. Hx of Rt breast cancer treated with lumpectomy with SLNB then ALND and radiation in 2008.   PAIN:  Are you having pain? Yes NPRS scale: 4/10 Pain location: left chest region  Pain  orientation: Left  PAIN TYPE: dull Pain description: constant  Aggravating factors: nothing  Relieving factors: tramadol   PRECAUTIONS: Rt LN removed/lymphedema risk  WEIGHT BEARING RESTRICTIONS: No  FALLS:  Has patient fallen in last 6 months? 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    LEISURE: hiking, gardening   HAND DOMINANCE: right   PRIOR LEVEL OF FUNCTION: Independent  PATIENT GOALS: get therapy for after this surgery.     OBJECTIVE:  COGNITION: Overall cognitive status: Within functional limits for tasks assessed   PALPATION: +3 ttp to light touch left axillary region and around back towards shoulder blade (pt reports fibromyalgia makes her pain worse)  OBSERVATIONS / OTHER ASSESSMENTS: wearing binder with small bulge above the top of the binder.  Incision is healed except it is very red with a rash present from the glue.    SENSATION: Light touch: Appears intact  POSTURE: increased thoracic kyphosis  UPPER EXTREMITY AROM/PROM:  A/PROM RIGHT   eval   Shoulder extension   Shoulder flexion   Shoulder abduction   Shoulder internal rotation   Shoulder external rotation     (Blank rows = not tested)  A/PROM LEFT   eval  Shoulder extension   Shoulder flexion 95  Shoulder abduction   Shoulder internal rotation   Shoulder external rotation     (Blank rows = not tested)  UPPER EXTREMITY STRENGTH:   LYMPHEDEMA ASSESSMENTS:   LANDMARK RIGHT  eval  At axilla    15 cm proximal to olecranon process   10 cm proximal to olecranon process   Olecranon process   15 cm proximal to ulnar styloid process   10 cm proximal to ulnar styloid process   Just proximal to ulnar styloid process   Across hand at thumb web space   At base of 2nd digit   (Blank rows = not tested)  LANDMARK LEFT  eval  At axilla    15 cm proximal to olecranon process   10 cm proximal to olecranon process    Olecranon process   15 cm proximal to ulnar styloid process   10 cm proximal to ulnar styloid process   Just proximal to ulnar styloid process   Across hand at thumb web space   At base of 2nd digit   (Blank rows = not tested)   QUICK DASH SURVEY: 50%   TODAY'S TREATMENT:  DATE: 03/04/23 Brief evaluation performed - pt only 1.5 weeks post mastectomy with drain removed yesterday Discussed how she can spend time out of the binder and that she will have improvements in the bulge when she switches to her regular bras tomorrow.  Gave pt small pillow for pressure Education on self MLD: breathing, clavicle circles, axillary circlesx10 each due to intact nodes Performed exercises in supine: AAROM flexion x 5, behind the head chest stretch opening as able x 5, wall walk into abduction x 5, and retractions x 5  PATIENT EDUCATION:  Education details: per todays session Person educated: Patient Education method: Explanation, Demonstration, Tactile cues, Verbal cues, and Handouts Education comprehension: verbalized understanding  HOME EXERCISE PROGRAM: Post op exercises Self MLD: 10 breaths, 10 clavicle nodes, 10 axillary nodes  ASSESSMENT:  CLINICAL IMPRESSION: Patient is a 67 y.o. female who was seen today for physical therapy evaluation and treatment for her recent Lt mastectomy.  She is having some skin vs fluid bulge over the edge of her binder which pt was encouraged would decrease after getting rid of the binder and getting into better bras.  She was educated on post op exercises and basic intact lymphatic system massage and deep breathing.  We will recheck in 1 week for update with more visits as needed.      OBJECTIVE IMPAIRMENTS: decreased activity tolerance, decreased ROM, increased edema, and pain.   ACTIVITY LIMITATIONS: lifting and reach over  head  PARTICIPATION LIMITATIONS: occupation  REHAB POTENTIAL: Excellent  CLINICAL DECISION MAKING: Stable/uncomplicated  EVALUATION COMPLEXITY: Low  GOALS: Goals reviewed with patient? Yes  SHORT TERM GOALS=LTGs: Target date: 04/01/23  Pt will improve Lt UE AROM to WNL Baseline: Goal status: INITIAL  2.  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   Stark Bray, PT 03/04/2023, 11:37 AM

## 2023-03-11 NOTE — Therapy (Signed)
OUTPATIENT PHYSICAL THERAPY  UPPER EXTREMITY ONCOLOGY TREATMENT  Patient Name: Savannah [name] MRN: [id number] DOB:Aug 09, 1956, 67 y.o., female Today's Date: 03/11/2023  END OF SESSION:    Past Medical History:  Diagnosis Date   Adhesive capsulitis    Anxiety    BPPV (benign paroxysmal positional vertigo)    Breast cancer 2008   Endometriosis    Fibromyalgia    GERD (gastroesophageal reflux disease)    Hyperlipidemia    Lymphedema    Malaria 1996   Personal history of radiation therapy 2008   PONV (postoperative nausea and vomiting)    Shingles 2002   Past Surgical History:  Procedure Laterality Date   BILATERAL SALPINGOOPHORECTOMY     BREAST BIOPSY Left 12/27/2022   MM LT BREAST BX W LOC DEV 1ST LESION IMAGE BX SPEC STEREO GUIDE 12/27/2022 [facility]   BREAST BIOPSY Left 12/27/2022   MM LT BREAST BX W LOC DEV EA AD LESION IMG BX SPEC STEREO GUIDE 12/27/2022 [facility]   BREAST BIOPSY  01/27/2023   MM LT RADIOACTIVE SEED LOC MAMMO GUIDE 01/27/2023 [facility]   BREAST BIOPSY  01/27/2023   MM LT RADIOACTIVE SEED EA ADD LESION LOC MAMMO GUIDE 01/27/2023 [facility]   BREAST LUMPECTOMY Right 2008   with right axillary node resection   BREAST LUMPECTOMY WITH RADIOACTIVE SEED LOCALIZATION Left 01/28/2023   Procedure: LEFT BREAST BRACKETED LUMPECTOMY WITH RADIOACTIVE SEED LOCALIZATION;  Surgeon: Harriette Bouillon, MD;  Location: [facility];  Service: General;  Laterality: Left;   CHOLECYSTECTOMY     LAPAROSCOPY     MASS EXCISION Right 04/30/2018   Procedure: EXCISION CYST DEBRIDEMENT INTERPHALANGEAL JOINT RIGHT THUMB;  Surgeon: Cindee Salt, MD;  Location: [facility];  Service: Orthopedics;  Laterality: Right;   SIMPLE MASTECTOMY WITH AXILLARY SENTINEL NODE BIOPSY Left 02/21/2023   Procedure: LEFT SIMPLE MASTECTOMY;  Surgeon: Harriette Bouillon, MD;  Location: [facility];  Service: General;  Laterality: Left;    TONSILLECTOMY     TOTAL KNEE ARTHROPLASTY Bilateral 07/2018, 10/2018   VAGINAL HYSTERECTOMY     Patient Active Problem List   Diagnosis Date Noted   COVID-19 02/06/2023   Ductal carcinoma in situ (DCIS) of left breast 01/07/2023   Encntr for general adult medical exam w/o abnormal findings 11/26/2022   Anxiety 11/26/2022   Arthritis 11/14/2022   Hyperlipidemia 11/14/2022   Irritable bowel syndrome 11/14/2022   Throat fullness 11/14/2022   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    PCP: Enid Skeens, NP  REFERRING PROVIDER: Dr. 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  ONSET DATE: 01/28/23  Rationale for Evaluation and Treatment: Rehabilitation  SUBJECTIVE:  SUBJECTIVE STATEMENT:    PERTINENT HISTORY: Left lumpectomy on 01/28/23 and then Left simple mastectomy on 02/21/23 due to DCIS. No nodes examined. Hx of Rt breast cancer treated with lumpectomy with SLNB then ALND and radiation in 2008.   PAIN:  Are you having pain? 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    LEISURE: hiking, gardening   HAND DOMINANCE: right   PRIOR LEVEL OF FUNCTION: Independent  PATIENT GOALS: get therapy for after this surgery.     OBJECTIVE:  PALPATION: +3 ttp to light touch left axillary region and around back towards shoulder blade (pt reports fibromyalgia makes her pain worse)  OBSERVATIONS / OTHER ASSESSMENTS:  wearing binder with small bulge above the top of the binder.  Incision is healed except it is very red with a rash present from the glue.    SENSATION: Light touch: Appears intact  POSTURE: increased thoracic kyphosis  UPPER EXTREMITY AROM/PROM:  A/PROM RIGHT   eval   Shoulder extension   Shoulder flexion   Shoulder abduction   Shoulder internal rotation   Shoulder external rotation     (Blank rows = not tested)  A/PROM LEFT   eval  Shoulder extension   Shoulder flexion 95  Shoulder abduction   Shoulder internal rotation   Shoulder external rotation     (Blank rows = not tested)  UPPER EXTREMITY STRENGTH:   LYMPHEDEMA ASSESSMENTS:   LANDMARK RIGHT  eval  At axilla    15 cm proximal to olecranon process   10 cm proximal to olecranon process   Olecranon process   15 cm proximal to ulnar styloid process   10 cm proximal to ulnar styloid process   Just proximal to ulnar styloid process   Across hand at thumb web space   At base of 2nd digit   (Blank rows = not tested)  LANDMARK LEFT  eval  At axilla    15 cm proximal to olecranon process   10 cm proximal to olecranon process   Olecranon process   15 cm proximal to ulnar styloid process   10 cm proximal to ulnar styloid process   Just proximal to ulnar styloid process   Across hand at thumb web space   At base of 2nd digit   (Blank rows = not tested)   QUICK DASH SURVEY: 50%  TODAY'S TREATMENT:                                                                                                                                          DATE: 03/04/23 Brief evaluation performed - pt only 1.5 weeks  post mastectomy with drain removed yesterday Discussed how she can spend time out of the binder and that she will have improvements in the bulge when she switches to her regular bras tomorrow.  Gave pt small pillow for pressure Education on self MLD: breathing, clavicle circles, axillary circlesx10 each due to intact  nodes Performed exercises in supine: AAROM flexion x 5, behind the head chest stretch opening as able x 5, wall walk into abduction x 5, and retractions x 5  PATIENT EDUCATION:  Education details: per todays session Person educated: Patient Education method: Explanation, Demonstration, Tactile cues, Verbal cues, and Handouts Education comprehension: verbalized understanding  HOME EXERCISE PROGRAM: Post op exercises Self MLD: 10 breaths, 10 clavicle nodes, 10 axillary nodes  ASSESSMENT:  CLINICAL IMPRESSION:   OBJECTIVE IMPAIRMENTS: decreased activity tolerance, decreased ROM, increased edema, and pain.   ACTIVITY LIMITATIONS: lifting and reach over head  PARTICIPATION LIMITATIONS: occupation  REHAB POTENTIAL: Excellent  CLINICAL DECISION MAKING: Stable/uncomplicated  EVALUATION COMPLEXITY: Low  GOALS: Goals reviewed with patient? Yes  SHORT TERM GOALS=LTGs: Target date: 04/01/23  Pt will improve Lt UE AROM to WNL Baseline: Goal status: INITIAL  2.  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   Idamae Lusherevis, Ozzie Remmers R, PT 03/11/2023, 12:50 PM

## 2023-03-12 ENCOUNTER — Ambulatory Visit: Payer: Medicare Other | Admitting: Rehabilitation

## 2023-03-12 ENCOUNTER — Encounter: Payer: Self-pay | Admitting: Rehabilitation

## 2023-03-12 DIAGNOSIS — Z483 Aftercare following surgery for neoplasm: Secondary | ICD-10-CM

## 2023-03-12 DIAGNOSIS — D0512 Intraductal carcinoma in situ of left breast: Secondary | ICD-10-CM

## 2023-03-12 DIAGNOSIS — I890 Lymphedema, not elsewhere classified: Secondary | ICD-10-CM

## 2023-03-13 NOTE — Progress Notes (Signed)
Patient Care Team: Early, Sung Amabile, NP as PCP - General (Nurse Practitioner) Pershing Proud, RN as Oncology Nurse Navigator Donnelly Angelica, RN as Oncology Nurse Navigator [name], MD as Consulting Physician (Hematology and Oncology)  DIAGNOSIS:  Encounter Diagnosis  Name Primary?   Ductal carcinoma in situ (DCIS) of left breast Yes    SUMMARY OF ONCOLOGIC HISTORY: Oncology History  Ductal carcinoma in situ (DCIS) of left breast  2008 Initial Biopsy   Right breast cancer T1 N1 M0 ER/PR positive HER2 negative status postlumpectomy radiation, Oncotype score 18, adjuvant antiestrogen therapy with tamoxifen x 10 years (intolerant to anastrozole and exemestane)   12/27/2022 Initial Diagnosis   Screening mammogram detected calcifications in the left breast, biopsies UOQ and LOQ revealed intermediate grade DCIS with necrosis and calcifications, ER 100%, PR 40%   01/28/2023 Surgery   Left lumpectomy: High-grade DCIS 5 cm solid and cribriform types with necrosis, margins DCIS present within the lateral and inferior margins, ER 100%, PR 40%   02/21/2023 Surgery   Left mastectomy: Residual focal high-grade DCIS solid and cribriform type without necrosis, negative for invasive cancer, margins negative     CHIEF COMPLIANT: Follow-up after surgery  INTERVAL HISTORY: Savannah [name] is a 67 y.o. female is here because of recent diagnosis of left breast DCIS.  She had a prior history of right breast cancer. She presents to the clinic for a follow-up.  Her major complaint today is a profound maculopapular rash on the left chest wall.  Initially was treated with Medrol Dosepak. It got better and then it got worse again and now she is on 20 mg of prednisone and she does not think it is getting any better.  It is causing her itching as well as pain and discomfort.  She is taking tramadol for the pain.Marland Kitchen   ALLERGIES:  is allergic to darvon.  MEDICATIONS:  Current Outpatient Medications   Medication Sig Dispense Refill   ALPRAZolam (XANAX) 0.25 MG tablet Take 1-2 tablets (0.25-0.5 mg total) by mouth 3 (three) times daily as needed for anxiety. 20 tablet 0   atorvastatin (LIPITOR) 10 MG tablet Take 1 tablet (10 mg total) by mouth daily. 90 tablet 1   cetirizine (ZYRTEC) 10 MG tablet Take 10 mg by mouth daily.     Cholecalciferol (VITAMIN D) 125 MCG (5000 UT) CAPS Take 1 capsule by mouth daily.     diphenhydrAMINE (BENADRYL) 25 mg capsule Take 50 mg by mouth at bedtime.     fluticasone (FLONASE) 50 MCG/ACT nasal spray Place 2 sprays into both nostrils daily.     gabapentin (NEURONTIN) 100 MG capsule Take 1 capsule (100 mg total) by mouth at bedtime. 30 capsule 2   hydrOXYzine (ATARAX) 25 MG tablet Take 1 tablet (25 mg total) by mouth at bedtime as needed and may repeat dose one time if needed for anxiety (sleep). 180 tablet 3   predniSONE (DELTASONE) 20 MG tablet Take 3 tablets (60 mg total) by mouth daily with breakfast. 80 tablet 0   traMADol (ULTRAM) 50 MG tablet Take 1 tablet (50 mg total) by mouth every 8 (eight) hours as needed. 45 tablet 2   valACYclovir (VALTREX) 1000 MG tablet Take 1 tablet (1,000 mg total) by mouth 2 (two) times daily. 14 tablet 0   No current facility-administered medications for this visit.    PHYSICAL EXAMINATION: ECOG PERFORMANCE STATUS: 1 - Symptomatic but completely ambulatory  Vitals:   03/14/23 1057  BP: (!) 137/57  Pulse:  80  Resp: 18  Temp: 97.6 F (36.4 C)  SpO2: 100%   Filed Weights   03/14/23 1057  Weight: 167 lb (75.8 kg)    BREAST: Profound maculopapular rash on the left chest wall along the surgical scar but not crossing the midline (exam performed in the presence of a chaperone)   LABORATORY DATA:  I have reviewed the data as listed    Latest Ref Rng & Units 11/01/2022   12:09 PM 05/17/2022    3:06 PM 09/11/2021   10:12 AM  CMP  Glucose 70 - 99 mg/dL 90  97  92   BUN 8 - 27 mg/dL 12  14  14    Creatinine 0.57 -  1.00 mg/dL 1.610.74  0.960.81  0.450.73   Sodium 134 - 144 mmol/L 140  142  140   Potassium 3.5 - 5.2 mmol/L 4.5  4.0  4.3   Chloride 96 - 106 mmol/L 101  103  103   CO2 20 - 29 mmol/L 25  22  22    Calcium 8.7 - 10.3 mg/dL 9.4  9.6  9.4   Total Protein 6.0 - 8.5 g/dL 6.6  6.9  6.5   Total Bilirubin 0.0 - 1.2 mg/dL 0.5  0.6  0.5   Alkaline Phos 44 - 121 IU/L 67  63  59   AST 0 - 40 IU/L 22  22  20    ALT 0 - 32 IU/L 16  15  16      Lab Results  Component Value Date   WBC 5.5 11/01/2022   HGB 13.7 11/01/2022   HCT 41.4 11/01/2022   MCV 89 11/01/2022   PLT 303 11/01/2022   NEUTROABS 3.8 11/01/2022    ASSESSMENT & PLAN:  Ductal carcinoma in situ (DCIS) of left breast 2008: Right breast cancer T1 N1 M0 ER/PR positive HER2 negative lumpectomy, radiation, Oncotype 18, tamoxifen x 10 years (intolerant to anastrozole and exemestane) 12/27/2022: Left breast DCIS ER 100%, PR 40% 01/28/2023: Left lumpectomy: High-grade DCIS 5 cm, positive margins 02/21/2023: Left mastectomy: Residual focal high-grade DCIS, margins negative  Discussion: Since the patient had a mastectomy there is no role for radiation.  Profound rash on the left chest wall: It is maculopapular in nature.  It is possible that it may be related to contact dermatitis from some surgical material.  She took initially steroids which made it go away but when she tapered off it came back.  Plan: Prednisone 60 mg a day with a plan to taper it down by 10 mg each week until the rash goes away then we will taper off faster Because it is only residing in the left chest wall and not crossing midline there is a slight suspicion that it could be shingles and therefore I sent a prescription for Valtrex. Pain in the chest wall: I sent a prescription for gabapentin to be taken at bedtime.  Telephone follow-up in 2 weeks to discuss her symptoms. Antiestrogen therapy will be postponed until she improves from this rash.    No orders of the defined types  were placed in this encounter.  The patient has a good understanding of the overall plan. she agrees with it. she will call with any problems that may develop before the next visit here. Total time spent: 30 mins including face to face time and time spent for planning, charting and co-ordination of care   Tamsen MeekViinay K Normal Recinos, MD 03/14/23    I Janan Ridgeeritra, Mcnairy am acting as a Neurosurgeonscribe  for Dr.Rithwik Schmieg  I have reviewed the above documentation for accuracy and completeness, and I agree with the above.

## 2023-03-14 ENCOUNTER — Inpatient Hospital Stay: Payer: Medicare Other | Attending: Hematology and Oncology | Admitting: Hematology and Oncology

## 2023-03-14 VITALS — BP 137/57 | HR 80 | Temp 97.6°F | Resp 18 | Ht 66.0 in | Wt 167.0 lb

## 2023-03-14 DIAGNOSIS — D0512 Intraductal carcinoma in situ of left breast: Secondary | ICD-10-CM | POA: Insufficient documentation

## 2023-03-14 DIAGNOSIS — R0789 Other chest pain: Secondary | ICD-10-CM | POA: Insufficient documentation

## 2023-03-14 DIAGNOSIS — R21 Rash and other nonspecific skin eruption: Secondary | ICD-10-CM | POA: Diagnosis not present

## 2023-03-14 DIAGNOSIS — Z9012 Acquired absence of left breast and nipple: Secondary | ICD-10-CM | POA: Diagnosis not present

## 2023-03-14 MED ORDER — PREDNISONE 20 MG PO TABS: 60.0000 mg | ORAL_TABLET | Freq: Every day | ORAL | 0 refills | Status: DC

## 2023-03-14 MED ORDER — GABAPENTIN 100 MG PO CAPS: 100.0000 mg | ORAL_CAPSULE | Freq: Every day | ORAL | 2 refills | Status: DC

## 2023-03-14 MED ORDER — VALACYCLOVIR HCL 1 G PO TABS: 1000.0000 mg | ORAL_TABLET | Freq: Two times a day (BID) | ORAL | 0 refills | Status: DC

## 2023-03-14 NOTE — Assessment & Plan Note (Addendum)
2008: Right breast cancer T1 N1 M0 ER/PR positive HER2 negative lumpectomy, radiation, Oncotype 18, tamoxifen x 10 years (intolerant to anastrozole and exemestane) 12/27/2022: Left breast DCIS ER 100%, PR 40% 01/28/2023: Left lumpectomy: High-grade DCIS 5 cm, positive margins 02/21/2023: Left mastectomy: Residual focal high-grade DCIS, margins negative  Discussion: Since the patient had a mastectomy there is no role for radiation.  Profound rash on the left chest wall: It is maculopapular in nature.  It is possible that it may be related to contact dermatitis from some surgical material.  She took initially steroids which made it go away but when she tapered off it came back.  Plan: Prednisone 60 mg a day with a plan to taper it down by 10 mg each week until the rash goes away then we will taper off faster Because it is only residing in the left chest wall and not crossing midline there is a slight suspicion that it could be shingles and therefore I sent a prescription for Valtrex. Pain in the chest wall: I sent a prescription for gabapentin to be taken at bedtime.  Telephone follow-up in 2 weeks to discuss her symptoms. Antiestrogen therapy will be postponed until she improves from this rash.

## 2023-03-18 ENCOUNTER — Encounter: Payer: Self-pay | Admitting: Hematology and Oncology

## 2023-03-19 ENCOUNTER — Ambulatory Visit: Payer: Medicare Other | Admitting: Rehabilitation

## 2023-03-21 ENCOUNTER — Encounter: Payer: Self-pay | Admitting: Hematology and Oncology

## 2023-03-26 ENCOUNTER — Ambulatory Visit: Payer: Medicare Other | Admitting: Rehabilitation

## 2023-03-26 ENCOUNTER — Encounter: Payer: Self-pay | Admitting: Rehabilitation

## 2023-03-26 DIAGNOSIS — Z483 Aftercare following surgery for neoplasm: Secondary | ICD-10-CM

## 2023-03-26 DIAGNOSIS — I890 Lymphedema, not elsewhere classified: Secondary | ICD-10-CM

## 2023-03-26 DIAGNOSIS — D0512 Intraductal carcinoma in situ of left breast: Secondary | ICD-10-CM | POA: Diagnosis not present

## 2023-03-26 NOTE — Therapy (Signed)
OUTPATIENT PHYSICAL THERAPY  UPPER EXTREMITY ONCOLOGY TREATMENT  Patient Name: Savannah [name] MRN: [id number] DOB:[birth year], 67 y.o., female Today's Date: 03/26/2023  END OF SESSION:  PT End of Session - 03/26/23 1249     Visit Number 3    Number of Visits 4    Date for PT Re-Evaluation 04/01/23    PT Start Time 1200    PT Stop Time 1247    PT Time Calculation (min) 47 min    Activity Tolerance Patient tolerated treatment well    Behavior During Therapy [facility] for tasks assessed/performed              Past Medical History:  Diagnosis Date   Adhesive capsulitis    Anxiety    BPPV (benign paroxysmal positional vertigo)    Breast cancer 2008   Endometriosis    Fibromyalgia    GERD (gastroesophageal reflux disease)    Hyperlipidemia    Lymphedema    Malaria 1996   Personal history of radiation therapy 2008   PONV (postoperative nausea and vomiting)    Shingles 2002   Past Surgical History:  Procedure Laterality Date   BILATERAL SALPINGOOPHORECTOMY     BREAST BIOPSY Left 12/27/2022   MM LT BREAST BX W LOC DEV 1ST LESION IMAGE BX SPEC STEREO GUIDE 12/27/2022 [facility]   BREAST BIOPSY Left 12/27/2022   MM LT BREAST BX W LOC DEV EA AD LESION IMG BX SPEC STEREO GUIDE 12/27/2022 [facility]   BREAST BIOPSY  01/27/2023   MM LT RADIOACTIVE SEED LOC MAMMO GUIDE 01/27/2023 [facility]   BREAST BIOPSY  01/27/2023   MM LT RADIOACTIVE SEED EA ADD LESION LOC MAMMO GUIDE 01/27/2023 [facility]   BREAST LUMPECTOMY Right 2008   with right axillary node resection   BREAST LUMPECTOMY WITH RADIOACTIVE SEED LOCALIZATION Left 01/28/2023   Procedure: LEFT BREAST BRACKETED LUMPECTOMY WITH RADIOACTIVE SEED LOCALIZATION;  Surgeon: Harriette Bouillon, MD;  Location: [facility];  Service: General;  Laterality: Left;   CHOLECYSTECTOMY     LAPAROSCOPY     MASS EXCISION Right 04/30/2018   Procedure: EXCISION CYST DEBRIDEMENT INTERPHALANGEAL JOINT RIGHT THUMB;   Surgeon: Cindee Salt, MD;  Location: [facility];  Service: Orthopedics;  Laterality: Right;   SIMPLE MASTECTOMY WITH AXILLARY SENTINEL NODE BIOPSY Left 02/21/2023   Procedure: LEFT SIMPLE MASTECTOMY;  Surgeon: Harriette Bouillon, MD;  Location: [facility];  Service: General;  Laterality: Left;   TONSILLECTOMY     TOTAL KNEE ARTHROPLASTY Bilateral 07/2018, 10/2018   VAGINAL HYSTERECTOMY     Patient Active Problem List   Diagnosis Date Noted   COVID-19 02/06/2023   Ductal carcinoma in situ (DCIS) of left breast 01/07/2023   Encntr for general adult medical exam w/o abnormal findings 11/26/2022   Anxiety 11/26/2022   Arthritis 11/14/2022   Hyperlipidemia 11/14/2022   Irritable bowel syndrome 11/14/2022   Throat fullness 11/14/2022   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    PCP: Enid Skeens, NP  REFERRING PROVIDER: Dr. Luisa Hart  REFERRING DIAG: [phone] (ICD-10-CM) - Acquired absence of left breast and nipple   THERAPY DIAG:  Ductal carcinoma in situ (DCIS) of left breast  Aftercare following surgery for neoplasm  Lymphedema of right arm  ONSET DATE: 01/28/23  Rationale for Evaluation and Treatment: Rehabilitation  SUBJECTIVE:  SUBJECTIVE STATEMENT:  It is much better.  I did a shoot for work and it went well.   PERTINENT HISTORY: Left lumpectomy on 01/28/23 and then Left simple mastectomy on 02/21/23 due to DCIS. No nodes examined. Hx of Rt breast cancer treated with lumpectomy with SLNB then ALND and radiation in 2008.   PAIN:  Are you having pain? 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    LEISURE: hiking, gardening   HAND DOMINANCE: right   PRIOR LEVEL OF FUNCTION: Independent  PATIENT GOALS: get therapy for after this surgery.     OBJECTIVE:  PALPATION: +3 ttp to light touch left axillary region and around back towards shoulder blade (pt reports fibromyalgia makes her pain worse)  OBSERVATIONS / OTHER ASSESSMENTS: wearing binder with small bulge above the top of the binder.  Incision is healed except it is very red with a rash present from the glue.    SENSATION: Light touch: Appears intact  POSTURE: increased thoracic kyphosis  UPPER EXTREMITY AROM/PROM:  A/PROM RIGHT   eval   Shoulder extension 65  Shoulder flexion 157  Shoulder abduction 170  Shoulder internal rotation   Shoulder external rotation 80    (Blank rows = not tested)  A/PROM LEFT   eval 03/26/23  Shoulder extension  65  Shoulder flexion 95 157 - tight  Shoulder abduction  170 - tight  Shoulder internal rotation    Shoulder external rotation  80    (Blank rows = not tested)  UPPER EXTREMITY STRENGTH: 5/5  QUICK DASH SURVEY: 50%  TODAY'S TREATMENT:                                                                                                                                          03/26/23 Rash is now gone and pts chest appears normal post operatively Pulleys flexion and abduction x each Yellow ball flexion 5" x 5 Row yellow x 10 Standing bil ER x 10 with back against wall Supine horizontal abd x 5 and diagonals x 5 each LTR with goal post arms 6" x 3  Supine PROM ito flexion, abduction, D2 flexion, and ER which is WNL Education on scar massage and moisturizing  DATE:  03/12/23 Pt still has a very red angry rash across the left chest above and below the mastectomy incision.  She is on day 2 of prednisone second pack to help.  Cut  foam for axilla and medial chest to use in bra Supine PROM ito flexion, abduction, D2 flexion, and ER which is doing much better.  Reviewed self MLD: clavicle and axillary circles  and deep breathing Attempted some MLD to the anterior chest but this is too tender so we switched to sidelying posterior interaxillary work Scapular PROM Active abduction x 4 and c/cc x 4 each  03/04/23 Brief evaluation performed - pt only 1.5 weeks post mastectomy with drain removed yesterday Discussed how she can spend time out of the binder and that she will have improvements in the bulge when she switches to her regular bras tomorrow.  Gave pt small pillow for pressure Education on self MLD: breathing, clavicle circles, axillary circlesx10 each due to intact nodes Performed exercises in supine: AAROM flexion x 5, behind the head chest stretch opening as able x 5, wall walk into abduction x 5, and retractions x 5  PATIENT EDUCATION:  Education details: per todays session Person educated: Patient Education method: Explanation, Demonstration, Tactile cues, Verbal cues, and Handouts Education comprehension: verbalized understanding  HOME EXERCISE PROGRAM: Self MLD: 10 breaths, 10 clavicle nodes, 10 axillary nodes as needed Access Code: [email or web address] URL: [email or web address] Date: 03/26/2023 Prepared by: Gwenevere Abbot  Exercises - Standing Row with Anchored Resistance  - 1 x daily - 3-4 x weekly - 1-3 sets - 10 reps - 2-3 second hold - Standing Shoulder External Rotation with Resistance  - 1 x daily - 3-4 x weekly - 1-3 sets - 10 reps - no hold - Supine Shoulder Horizontal Abduction with Resistance  - 1 x daily - 3-4 x weekly - 1-3 sets - 10 reps - 2-3 second hold - Supine PNF D2 Flexion with Resistance  - 1 x daily - 3-4 x weekly - 1-3 sets - 10 reps - 2-3 second hold - Supine Lower Trunk Rotation  - 1 x daily - 7 x weekly - 1-3 sets - 10 reps - 10 seconds hold  ASSESSMENT:  CLINICAL  IMPRESSION: Pt has not returned to baseline AROM with mild pull in the pectoralis region.  She is now not having that painful rash and is doing very well.  Will do 1 more visit and then DC.   OBJECTIVE IMPAIRMENTS: decreased activity tolerance, decreased ROM, increased edema, and pain.   ACTIVITY LIMITATIONS: lifting and reach over head  PARTICIPATION LIMITATIONS: occupation  REHAB POTENTIAL: Excellent  CLINICAL DECISION MAKING: Stable/uncomplicated  EVALUATION COMPLEXITY: Low  GOALS: Goals reviewed with patient? Yes  SHORT TERM GOALS=LTGs: Target date: 04/01/23  Pt will improve Lt UE AROM to WNL Baseline: Goal status: MET  2.  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   Idamae Lusher, PT 03/26/2023, 12:49 PM

## 2023-03-28 ENCOUNTER — Encounter: Payer: Self-pay | Admitting: Hematology and Oncology

## 2023-04-02 NOTE — Progress Notes (Signed)
HEMATOLOGY-ONCOLOGY TELEPHONE VISIT PROGRESS NOTE  I connected with our patient on 04/03/23 at  3:15 PM EDT by telephone and verified that I am speaking with the correct person using two identifiers.  I discussed the limitations, risks, security and privacy concerns of performing an evaluation and management service by telephone and the availability of in person appointments.  I also discussed with the patient that there may be a patient responsible charge related to this service. The patient expressed understanding and agreed to proceed.   History of Present Illness: Savannah [name] is a 67 y.o. female is here because of recent diagnosis of left breast DCIS.  She had a prior history of right breast cancer. She presents to the clinic for a telephone follow-up on rash.   Oncology History  Ductal carcinoma in situ (DCIS) of left breast  2008 Initial Biopsy   Right breast cancer T1 N1 M0 ER/PR positive HER2 negative status postlumpectomy radiation, Oncotype score 18, adjuvant antiestrogen therapy with tamoxifen x 10 years (intolerant to anastrozole and exemestane)   12/27/2022 Initial Diagnosis   Screening mammogram detected calcifications in the left breast, biopsies UOQ and LOQ revealed intermediate grade DCIS with necrosis and calcifications, ER 100%, PR 40%   01/28/2023 Surgery   Left lumpectomy: High-grade DCIS 5 cm solid and cribriform types with necrosis, margins DCIS present within the lateral and inferior margins, ER 100%, PR 40%   02/21/2023 Surgery   Left mastectomy: Residual focal high-grade DCIS solid and cribriform type without necrosis, negative for invasive cancer, margins negative     REVIEW OF SYSTEMS:   Constitutional: Denies fevers, chills or abnormal weight loss All other systems were reviewed with the patient and are negative. Observations/Objective:     Assessment Plan:  Ductal carcinoma in situ (DCIS) of left breast 2008: Right breast cancer T1 N1 M0 ER/PR positive  HER2 negative lumpectomy, radiation, Oncotype 18, tamoxifen x 10 years (intolerant to anastrozole and exemestane) 12/27/2022: Left breast DCIS ER 100%, PR 40% 01/28/2023: Left lumpectomy: High-grade DCIS 5 cm, positive margins 02/21/2023: Left mastectomy: Residual focal high-grade DCIS, margins negative   Discussion: Since the patient had a mastectomy there is no role for radiation.   Profound rash on the left chest wall: It is maculopapular in nature.  Prescribed steroids and Valtrex Antiestrogen therapy with Letrozole will be started. Survivorship care plan visit in 3 months  I discussed the assessment and treatment plan with the patient. The patient was provided an opportunity to ask questions and all were answered. The patient agreed with the plan and demonstrated an understanding of the instructions. The patient was advised to call back or seek an in-person evaluation if the symptoms worsen or if the condition fails to improve as anticipated.   I provided 12 minutes of non-face-to-face time during this encounter.  This includes time for charting and coordination of care   Tamsen Meek, MD  I Janan Ridge am acting as a scribe for Dr.Vinay Gudena  I have reviewed the above documentation for accuracy and completeness, and I agree with the above.

## 2023-04-03 ENCOUNTER — Inpatient Hospital Stay: Payer: Medicare Other | Attending: Hematology and Oncology | Admitting: Hematology and Oncology

## 2023-04-03 DIAGNOSIS — D0512 Intraductal carcinoma in situ of left breast: Secondary | ICD-10-CM

## 2023-04-03 MED ORDER — LETROZOLE 2.5 MG PO TABS: 2.5000 mg | ORAL_TABLET | Freq: Every day | ORAL | 3 refills | Status: DC

## 2023-04-03 NOTE — Assessment & Plan Note (Signed)
2008: Right breast cancer T1 N1 M0 ER/PR positive HER2 negative lumpectomy, radiation, Oncotype 18, tamoxifen x 10 years (intolerant to anastrozole and exemestane) 12/27/2022: Left breast DCIS ER 100%, PR 40% 01/28/2023: Left lumpectomy: High-grade DCIS 5 cm, positive margins 02/21/2023: Left mastectomy: Residual focal high-grade DCIS, margins negative   Discussion: Since the patient had a mastectomy there is no role for radiation.   Profound rash on the left chest wall: It is maculopapular in nature.  Prescribed steroids and Valtrex  Antiestrogen therapy will be postponed until she improves from this rash

## 2023-04-04 ENCOUNTER — Telehealth: Payer: Self-pay | Admitting: Hematology and Oncology

## 2023-04-04 NOTE — Telephone Encounter (Signed)
Scheduled appointment per 5/2 los. Patient is aware of the made appointment.

## 2023-04-05 ENCOUNTER — Other Ambulatory Visit: Payer: Self-pay | Admitting: Hematology and Oncology

## 2023-04-07 ENCOUNTER — Telehealth: Payer: Self-pay

## 2023-04-07 NOTE — Telephone Encounter (Signed)
Attempted to call pt to see if she is still requiring steroid support. No answer. LVM for call back.

## 2023-04-09 ENCOUNTER — Ambulatory Visit: Payer: Medicare Other | Attending: Surgery | Admitting: Rehabilitation

## 2023-04-09 DIAGNOSIS — Z483 Aftercare following surgery for neoplasm: Secondary | ICD-10-CM | POA: Insufficient documentation

## 2023-04-09 DIAGNOSIS — I890 Lymphedema, not elsewhere classified: Secondary | ICD-10-CM | POA: Insufficient documentation

## 2023-04-09 DIAGNOSIS — D0512 Intraductal carcinoma in situ of left breast: Secondary | ICD-10-CM | POA: Insufficient documentation

## 2023-04-09 NOTE — Therapy (Signed)
OUTPATIENT PHYSICAL THERAPY  UPPER EXTREMITY ONCOLOGY TREATMENT  Patient Name: Savannah [name] MRN: [id number] DOB:Apr 15, 1956, 67 y.o., female Today's Date: 04/09/2023  END OF SESSION:  PT End of Session - 04/09/23 1702     Visit Number 4    Number of Visits 4    PT Start Time 1502    PT Stop Time 1535    PT Time Calculation (min) 33 min    Activity Tolerance Patient tolerated treatment well    Behavior During Therapy [facility] for tasks assessed/performed               Past Medical History:  Diagnosis Date   Adhesive capsulitis    Anxiety    BPPV (benign paroxysmal positional vertigo)    Breast cancer (HCC) 2008   Endometriosis    Fibromyalgia    GERD (gastroesophageal reflux disease)    Hyperlipidemia    Lymphedema    Malaria 1996   Personal history of radiation therapy 2008   PONV (postoperative nausea and vomiting)    Shingles 2002   Past Surgical History:  Procedure Laterality Date   BILATERAL SALPINGOOPHORECTOMY     BREAST BIOPSY Left 12/27/2022   MM LT BREAST BX W LOC DEV 1ST LESION IMAGE BX SPEC STEREO GUIDE 12/27/2022 [facility]   BREAST BIOPSY Left 12/27/2022   MM LT BREAST BX W LOC DEV EA AD LESION IMG BX SPEC STEREO GUIDE 12/27/2022 [facility]   BREAST BIOPSY  01/27/2023   MM LT RADIOACTIVE SEED LOC MAMMO GUIDE 01/27/2023 [facility]   BREAST BIOPSY  01/27/2023   MM LT RADIOACTIVE SEED EA ADD LESION LOC MAMMO GUIDE 01/27/2023 [facility]   BREAST LUMPECTOMY Right 2008   with right axillary node resection   BREAST LUMPECTOMY WITH RADIOACTIVE SEED LOCALIZATION Left 01/28/2023   Procedure: LEFT BREAST BRACKETED LUMPECTOMY WITH RADIOACTIVE SEED LOCALIZATION;  Surgeon: Harriette Bouillon, MD;  Location: [facility];  Service: General;  Laterality: Left;   CHOLECYSTECTOMY     LAPAROSCOPY     MASS EXCISION Right 04/30/2018   Procedure: EXCISION CYST DEBRIDEMENT INTERPHALANGEAL JOINT RIGHT THUMB;  Surgeon: Cindee Salt, MD;   Location: [facility];  Service: Orthopedics;  Laterality: Right;   SIMPLE MASTECTOMY WITH AXILLARY SENTINEL NODE BIOPSY Left 02/21/2023   Procedure: LEFT SIMPLE MASTECTOMY;  Surgeon: Harriette Bouillon, MD;  Location: [facility];  Service: General;  Laterality: Left;   TONSILLECTOMY     TOTAL KNEE ARTHROPLASTY Bilateral 07/2018, 10/2018   VAGINAL HYSTERECTOMY     Patient Active Problem List   Diagnosis Date Noted   COVID-19 02/06/2023   Ductal carcinoma in situ (DCIS) of left breast 01/07/2023   Encntr for general adult medical exam w/o abnormal findings 11/26/2022   Anxiety 11/26/2022   Arthritis 11/14/2022   Hyperlipidemia 11/14/2022   Irritable bowel syndrome 11/14/2022   Throat fullness 11/14/2022   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    PCP: Enid Skeens, NP  REFERRING PROVIDER: Dr. Luisa Hart  REFERRING DIAG: Z90.12 (ICD-10-CM) - Acquired absence of left breast and nipple   THERAPY DIAG:  Ductal carcinoma in situ (DCIS) of left breast  Lymphedema of right arm  Aftercare following surgery for neoplasm  ONSET DATE: 01/28/23  Rationale for Evaluation and Treatment: Rehabilitation  SUBJECTIVE:  SUBJECTIVE STATEMENT:  The new exercises hurt my neck so I need to change those or do something different.  I have 4 more days of prednisone but the rash is gone.   PERTINENT HISTORY: Left lumpectomy on 01/28/23 and then Left simple mastectomy on 02/21/23 due to DCIS. No nodes examined. Hx of Rt breast cancer treated with lumpectomy with SLNB then ALND and radiation in 2008.   PAIN:  Are you having pain? 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     LEISURE: hiking, gardening   HAND DOMINANCE: right   PRIOR LEVEL OF FUNCTION: Independent  PATIENT GOALS: get therapy for after this surgery.     OBJECTIVE:  PALPATION: +3 ttp to light touch left axillary region and around back towards shoulder blade (pt reports fibromyalgia makes her pain worse)  OBSERVATIONS / OTHER ASSESSMENTS: wearing binder with small bulge above the top of the binder.  Incision is healed except it is very red with a rash present from the glue.    SENSATION: Light touch: Appears intact  POSTURE: increased thoracic kyphosis  UPPER EXTREMITY AROM/PROM:  A/PROM RIGHT   eval   Shoulder extension 65  Shoulder flexion 157  Shoulder abduction 170  Shoulder internal rotation   Shoulder external rotation 80    (Blank rows = not tested)  A/PROM LEFT   eval 03/26/23 04/09/23  Shoulder extension  65   Shoulder flexion 95 157 - tight 157- tight pect  Shoulder abduction  170 - tight 170 - tight pect   Shoulder internal rotation     Shoulder external rotation  80     (Blank rows = not tested)  UPPER EXTREMITY STRENGTH: 5/5  QUICK DASH SURVEY: 50%  TODAY'S TREATMENT:                                                                                                                                         04/09/23: Pt reports she feels ready to be discharged. All goals met Performed PROM to test and this was full.  Discussed stopping band exercises and focus on doorway, LTR, and shoulder row movements due to neck pain.   03/26/23 Rash is now gone and pts chest appears normal post operatively Pulleys flexion and abduction x each Yellow ball flexion 5" x 5 Row yellow x 10 Standing bil ER x 10 with back against wall Supine horizontal abd x 5 and diagonals x 5 each LTR with goal post arms 6" x 3  Supine PROM ito flexion, abduction, D2 flexion, and ER which is WNL Education on scar massage and moisturizing  DATE:  03/12/23 Pt still has a very red angry  rash across the left chest above and below the mastectomy incision.  She is on day 2 of prednisone second  pack to help.  Cut foam for axilla and medial chest to use in bra Supine PROM ito flexion, abduction, D2 flexion, and ER which is doing much better.  Reviewed self MLD: clavicle and axillary circles and deep breathing Attempted some MLD to the anterior chest but this is too tender so we switched to sidelying posterior interaxillary work Scapular PROM Active abduction x 4 and c/cc x 4 each  03/04/23 Brief evaluation performed - pt only 1.5 weeks post mastectomy with drain removed yesterday Discussed how she can spend time out of the binder and that she will have improvements in the bulge when she switches to her regular bras tomorrow.  Gave pt small pillow for pressure Education on self MLD: breathing, clavicle circles, axillary circlesx10 each due to intact nodes Performed exercises in supine: AAROM flexion x 5, behind the head chest stretch opening as able x 5, wall walk into abduction x 5, and retractions x 5  PATIENT EDUCATION:  Education details: per todays session Person educated: Patient Education method: Explanation, Demonstration, Tactile cues, Verbal cues, and Handouts Education comprehension: verbalized understanding  HOME EXERCISE PROGRAM: Self MLD: 10 breaths, 10 clavicle nodes, 10 axillary nodes as needed Access Code: [email or web address] URL: [email or web address] Date: 03/26/2023 Prepared by: Gwenevere Abbot  Exercises - Supine Lower Trunk Rotation  - 1 x daily - 7 x weekly - 1-3 sets - 10 reps - 10 seconds hold Doorway stretch  ASSESSMENT:  CLINICAL IMPRESSION:  Pt is ready for DC. All goals met.   OBJECTIVE IMPAIRMENTS: decreased activity tolerance, decreased ROM, increased edema, and pain.   ACTIVITY LIMITATIONS: lifting and reach over head  PARTICIPATION LIMITATIONS: occupation  REHAB POTENTIAL: Excellent  CLINICAL DECISION MAKING:  Stable/uncomplicated  EVALUATION COMPLEXITY: Low  GOALS: Goals reviewed with patient? Yes  SHORT TERM GOALS=LTGs: Target date: 04/01/23  Pt will improve Lt UE AROM to WNL Baseline: Goal status: MET  2.  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   Idamae Lusher, PT 04/09/2023, 5:05 PM  PHYSICAL THERAPY DISCHARGE SUMMARY  Visits from Start of Care: 4  Current functional level related to goals / functional outcomes: Ready for DC   Remaining deficits: none   Education / Equipment: Final HEP  Plan: Patient agrees to discharge.  Patient is being discharged due to meeting the stated rehab goals.

## 2023-05-16 ENCOUNTER — Other Ambulatory Visit: Payer: Self-pay | Admitting: Nurse Practitioner

## 2023-05-16 DIAGNOSIS — F419 Anxiety disorder, unspecified: Secondary | ICD-10-CM

## 2023-06-26 ENCOUNTER — Other Ambulatory Visit: Payer: Self-pay | Admitting: Nurse Practitioner

## 2023-06-27 ENCOUNTER — Telehealth: Payer: Self-pay

## 2023-06-27 ENCOUNTER — Other Ambulatory Visit: Payer: Self-pay

## 2023-06-27 DIAGNOSIS — Z1211 Encounter for screening for malignant neoplasm of colon: Secondary | ICD-10-CM

## 2023-06-27 NOTE — Telephone Encounter (Signed)
Would code as screening She has hx of incomplete/difficult colonoscopy JMP

## 2023-06-27 NOTE — Telephone Encounter (Signed)
Called patient to inform her she is due for her recall Cologuard. Patient agreed to proceed with Cologuard test. Patient expressed concerns regarding if insurance will cover this test since it is for family history of colon cancer. Are we associating this test with the diagnosis of family history or colon cancer screening?

## 2023-07-10 ENCOUNTER — Inpatient Hospital Stay: Payer: Medicare Other | Admitting: Adult Health

## 2023-07-10 ENCOUNTER — Telehealth: Payer: Self-pay | Admitting: Adult Health

## 2023-07-10 NOTE — Telephone Encounter (Signed)
Patient is aware of power outage; patient is aware of rescheduled appointment times/dates

## 2023-08-11 ENCOUNTER — Encounter: Payer: Self-pay | Admitting: Adult Health

## 2023-08-11 ENCOUNTER — Inpatient Hospital Stay: Payer: Medicare Other | Attending: Adult Health | Admitting: Adult Health

## 2023-08-11 VITALS — BP 114/69 | HR 78 | Temp 97.9°F | Resp 18 | Ht 66.0 in | Wt 171.0 lb

## 2023-08-11 DIAGNOSIS — D0512 Intraductal carcinoma in situ of left breast: Secondary | ICD-10-CM | POA: Diagnosis not present

## 2023-08-11 DIAGNOSIS — Z1231 Encounter for screening mammogram for malignant neoplasm of breast: Secondary | ICD-10-CM

## 2023-08-11 DIAGNOSIS — Z79811 Long term (current) use of aromatase inhibitors: Secondary | ICD-10-CM | POA: Insufficient documentation

## 2023-08-11 DIAGNOSIS — Z170 Estrogen receptor positive status [ER+]: Secondary | ICD-10-CM | POA: Diagnosis not present

## 2023-08-11 NOTE — Progress Notes (Signed)
SURVIVORSHIP VISIT:  BRIEF ONCOLOGIC HISTORY:  Oncology History  Ductal carcinoma in situ (DCIS) of left breast  2008 Initial Biopsy   Right breast cancer T1 N1 M0 ER/PR positive HER2 negative status postlumpectomy radiation, Oncotype score 18, adjuvant antiestrogen therapy with tamoxifen x 10 years (intolerant to anastrozole and exemestane)   12/27/2022 Initial Diagnosis   Screening mammogram detected calcifications in the left breast, biopsies UOQ and LOQ revealed intermediate grade DCIS with necrosis and calcifications, ER 100%, PR 40%   01/28/2023 Surgery   Left lumpectomy: High-grade DCIS 5 cm solid and cribriform types with necrosis, margins DCIS present within the lateral and inferior margins, ER 100%, PR 40%   02/21/2023 Surgery   Left mastectomy: Residual focal high-grade DCIS solid and cribriform type without necrosis, negative for invasive cancer, margins negative   08/11/2023 Cancer Staging   Staging form: Breast, AJCC 8th Edition - Pathologic: Stage 0 (pTis (DCIS), pN0, cM0, ER+, PR+) - Signed by Loa Socks, NP on 08/11/2023   09/2023 -  Anti-estrogen oral therapy   Letrozole daily     INTERVAL HISTORY:  Savannah [name] to review her survivorship care plan detailing her treatment course for breast cancer, as well as monitoring long-term side effects of that treatment, education regarding health maintenance, screening, and overall wellness and health promotion.     Overall, Savannah [name] reports feeling quite well.  She has not yet started the letrozole prescribed by Dr. Pamelia Hoit.  She is working on getting through some difficult side effects from surgery and plans to start it soon.   REVIEW OF SYSTEMS:  Review of Systems  Constitutional:  Negative for appetite change, chills, fatigue, fever and unexpected weight change.  HENT:   Negative for hearing loss, lump/mass and trouble swallowing.   Eyes:  Negative for eye problems and icterus.  Respiratory:  Negative for chest  tightness, cough and shortness of breath.   Cardiovascular:  Negative for chest pain, leg swelling and palpitations.  Gastrointestinal:  Negative for abdominal distention, abdominal pain, constipation, diarrhea, nausea and vomiting.  Endocrine: Negative for hot flashes.  Genitourinary:  Negative for difficulty urinating.   Musculoskeletal:  Negative for arthralgias.  Skin:  Negative for itching and rash.  Neurological:  Negative for dizziness, extremity weakness, headaches and numbness.  Hematological:  Negative for adenopathy. Does not bruise/bleed easily.  Psychiatric/Behavioral:  Negative for depression. The patient is not nervous/anxious.   Breast: Denies any new nodularity, masses, tenderness, nipple changes, or nipple discharge.       PAST MEDICAL/SURGICAL HISTORY:  Past Medical History:  Diagnosis Date   Adhesive capsulitis    Anxiety    BPPV (benign paroxysmal positional vertigo)    Breast cancer (HCC) 2008   Endometriosis    Fibromyalgia    GERD (gastroesophageal reflux disease)    Hyperlipidemia    Lymphedema    Malaria 1996   Personal history of radiation therapy 2008   PONV (postoperative nausea and vomiting)    Shingles 2002   Past Surgical History:  Procedure Laterality Date   BILATERAL SALPINGOOPHORECTOMY     BREAST BIOPSY Left 12/27/2022   MM LT BREAST BX W LOC DEV 1ST LESION IMAGE BX SPEC STEREO GUIDE 12/27/2022 [facility]   BREAST BIOPSY Left 12/27/2022   MM LT BREAST BX W LOC DEV EA AD LESION IMG BX SPEC STEREO GUIDE 12/27/2022 [facility]   BREAST BIOPSY  01/27/2023   MM LT RADIOACTIVE SEED LOC MAMMO GUIDE 01/27/2023 [facility]   BREAST BIOPSY  01/27/2023   MM LT RADIOACTIVE SEED EA ADD LESION LOC MAMMO GUIDE 01/27/2023 [facility]   BREAST LUMPECTOMY Right 2008   with right axillary node resection   BREAST LUMPECTOMY WITH RADIOACTIVE SEED LOCALIZATION Left 01/28/2023   Procedure: LEFT BREAST BRACKETED LUMPECTOMY WITH RADIOACTIVE  SEED LOCALIZATION;  Surgeon: Harriette Bouillon, MD;  Location: [facility];  Service: General;  Laterality: Left;   CHOLECYSTECTOMY     LAPAROSCOPY     MASS EXCISION Right 04/30/2018   Procedure: EXCISION CYST DEBRIDEMENT INTERPHALANGEAL JOINT RIGHT THUMB;  Surgeon: Cindee Salt, MD;  Location: [facility];  Service: Orthopedics;  Laterality: Right;   SIMPLE MASTECTOMY WITH AXILLARY SENTINEL NODE BIOPSY Left 02/21/2023   Procedure: LEFT SIMPLE MASTECTOMY;  Surgeon: Harriette Bouillon, MD;  Location: [facility];  Service: General;  Laterality: Left;   TONSILLECTOMY     TOTAL KNEE ARTHROPLASTY Bilateral 07/2018, 10/2018   VAGINAL HYSTERECTOMY       ALLERGIES:  Allergies  Allergen Reactions   Darvon Itching and Nausea Only     CURRENT MEDICATIONS:  Outpatient Encounter Medications as of 08/11/2023  Medication Sig   IRON, FERROUS GLUCONATE, PO Take 65 mg by mouth daily.   MAGNESIUM CITRATE PO Take 400 mg by mouth daily.   ALPRAZolam (XANAX) 0.25 MG tablet Take 1-2 tablets (0.25-0.5 mg total) by mouth 3 (three) times daily as needed for anxiety.   atorvastatin (LIPITOR) 10 MG tablet TAKE 1 TABLET BY MOUTH EVERY DAY   cetirizine (ZYRTEC) 10 MG tablet Take 10 mg by mouth daily.   Cholecalciferol (VITAMIN D) 125 MCG (5000 UT) CAPS Take 1 capsule by mouth daily.   diphenhydrAMINE (BENADRYL) 25 mg capsule Take 50 mg by mouth at bedtime.   fluticasone (FLONASE) 50 MCG/ACT nasal spray Place 2 sprays into both nostrils daily.   hydrOXYzine (ATARAX) 25 MG tablet Take 1 tablet (25 mg total) by mouth at bedtime as needed and may repeat dose one time if needed for anxiety (sleep).   letrozole (FEMARA) 2.5 MG tablet Take 1 tablet (2.5 mg total) by mouth daily.   traMADol (ULTRAM) 50 MG tablet Take 1 tablet (50 mg total) by mouth every 8 (eight) hours as needed.   [DISCONTINUED] gabapentin (NEURONTIN) 100 MG capsule Take 1 capsule (100 mg total) by mouth at bedtime.    [DISCONTINUED] predniSONE (DELTASONE) 20 MG tablet Take 3 tablets (60 mg total) by mouth daily with breakfast.   [DISCONTINUED] valACYclovir (VALTREX) 1000 MG tablet Take 1 tablet (1,000 mg total) by mouth 2 (two) times daily.   No facility-administered encounter medications on file as of 08/11/2023.     ONCOLOGIC FAMILY HISTORY:  Family History  Problem Relation Age of Onset   Cancer Mother    Rectal cancer Mother    Stroke Mother    Heart disease Father    Heart attack Father    Crohn's disease Maternal Uncle    Liver disease Neg Hx    Colon polyps Neg Hx    Breast cancer Neg Hx      SOCIAL HISTORY:  Social History   Socioeconomic History   Marital status: Single    Spouse name: Not on file   Number of children: Not on file   Years of education: Not on file   Highest education level: Not on file  Occupational History   Not on file  Tobacco Use   Smoking status: Never   Smokeless tobacco: Never  Vaping Use   Vaping  status: Never Used  Substance and Sexual Activity   Alcohol use: Yes    Comment: 2 glasses of wine with dinner, 3-4x/week   Drug use: Never   Sexual activity: Not Currently    Birth control/protection: Surgical  Other Topics Concern   Not on file  Social History Narrative   Chiropractor   Social Determinants of Health   Financial Resource Strain: Low Risk  (09/20/2022)   Overall Financial Resource Strain (CARDIA)    Difficulty of Paying Living Expenses: Not hard at all  Food Insecurity: No Food Insecurity (09/20/2022)   Hunger Vital Sign    Worried About Running Out of Food in the Last Year: Never true    Ran Out of Food in the Last Year: Never true  Transportation Needs: No Transportation Needs (09/20/2022)   PRAPARE - Administrator, Civil Service (Medical): No    Lack of Transportation (Non-Medical): No  Physical Activity: Inactive (09/20/2022)   Exercise Vital Sign    Days of Exercise per Week: 0 days    Minutes of  Exercise per Session: 0 min  Stress: Stress Concern Present (09/20/2022)   [employer] of Occupational Health - Occupational Stress Questionnaire    Feeling of Stress : To some extent  Social Connections: Not on file  Intimate Partner Violence: Not on file     OBSERVATIONS/OBJECTIVE:  BP 114/69 (BP Location: Left Arm, Patient Position: Sitting)   Pulse 78   Temp 97.9 F (36.6 C) (Tympanic)   Resp 18   Ht 5\' 6"  (1.676 m)   Wt 171 lb (77.6 kg)   SpO2 97%   BMI 27.60 kg/m  GENERAL: Patient is a well appearing female in no acute distress HEENT:  Sclerae anicteric.  Oropharynx clear and moist. No ulcerations or evidence of oropharyngeal candidiasis. Neck is supple.  NODES:  No cervical, supraclavicular, or axillary lymphadenopathy palpated.  BREAST EXAM:  left breast s/p mastectomy, right breast s/p lumpectomy andradaition LUNGS:  Clear to auscultation bilaterally.  No wheezes or rhonchi. HEART:  Regular rate and rhythm. No murmur appreciated. ABDOMEN:  Soft, nontender.  Positive, normoactive bowel sounds. No organomegaly palpated. MSK:  No focal spinal tenderness to palpation. Full range of motion bilaterally in the upper extremities. EXTREMITIES:  No peripheral edema.   SKIN:  Clear with no obvious rashes or skin changes. No nail dyscrasia. NEURO:  Nonfocal. Well oriented.  Appropriate affect.   LABORATORY DATA:  None for this visit.  DIAGNOSTIC IMAGING:  None for this visit.      ASSESSMENT AND PLAN:  Ms.. [name] is a pleasant 67 y.o. female with Stage 0 left breast DCIS, ER+/PR+, diagnosed in 12/2022, treated with mastectomy and anti-estrogen therapy with Letrozole beginning in 04/2023.  She presents to the Survivorship Clinic for our initial meeting and routine follow-up post-completion of treatment for breast cancer.    1. Stage 0 left breast cancer:  Savannah [name] is continuing to recover from definitive treatment for breast cancer. She will follow-up with her medical  oncologist, Dr.  Pamelia Hoit in 6 months with history and physical exam per surveillance protocol.  She will continue her anti-estrogen therapy with Letrozole. Thus far, she is tolerating the Letrozole well, with minimal side effects.  She is recommended to continue to undergo annual screening right breast mammograms.  Today, a comprehensive survivorship care plan and treatment summary was reviewed with the patient today detailing her breast cancer diagnosis, treatment course, potential late/long-term effects of treatment, appropriate follow-up care  with recommendations for the future, and patient education resources.  A copy of this summary, along with a letter will be sent to the patient's primary care provider via mail/fax/In Basket message after today's visit.    2. Compression garments: She has a history of right arm lymphedema along with lower extremity edema and wears compression garments to her bilateral lower extremities along with the right arm compression sleeve and glove.  She needs new prescription for these however cannot remember the details for the sleeve.  I told her to send me a MyChart message once he has the details and I will send in everything as it should be.  I did give her an order for compression bras and prostheses today as well.  3. Bone health:  Given Savannah [name]'s age/history of breast cancer and her current treatment regimen including anti-estrogen therapy with letrozole, she is at risk for bone demineralization.  Her last DEXA scan was May 2019 and was normal.  I placed orders for this to be repeated in the next several weeks.  She was given education on specific activities to promote bone health.  4. Cancer screening:  Due to Savannah [name]'s history and her age, she should receive screening for skin cancers, colon cancer.  The information and recommendations are listed on the patient's comprehensive care plan/treatment summary and were reviewed in detail with the patient.    5. Health  maintenance and wellness promotion: Savannah [name] was encouraged to consume 5-7 servings of fruits and vegetables per day. We reviewed the "Nutrition Rainbow" handout.  She was also encouraged to engage in moderate to vigorous exercise for 30 minutes per day most days of the week.  She was instructed to limit her alcohol consumption and continue to abstain from tobacco use.     6. Support services/counseling: It is not uncommon for this period of the patient's cancer care trajectory to be one of many emotions and stressors.   She was given information regarding our available services and encouraged to contact me with any questions or for help enrolling in any of our support group/programs.    Follow up instructions:    -Return to cancer center in 6 months for follow-up with Dr. Pamelia Hoit -Mammogram due in 11/2023 -Bone density testing due -She is welcome to return back to the Survivorship Clinic at any time; no additional follow-up needed at this time.  -Consider referral back to survivorship as a long-term survivor for continued surveillance  The patient was provided an opportunity to ask questions and all were answered. The patient agreed with the plan and demonstrated an understanding of the instructions.   Total encounter time:45 minutes*in face-to-face visit time, chart review, lab review, care coordination, order entry, and documentation of the encounter time.    Lillard Anes, NP 08/11/23 5:47 PM Medical Oncology and Hematology [facility] [address] [place], [place] [address] Tel. [phone]    Fax. [phone]  *Total Encounter Time as defined by the Centers for Medicare and Medicaid Services includes, in addition to the face-to-face time of a patient visit (documented in the note above) non-face-to-face time: obtaining and reviewing outside history, ordering and reviewing medications, tests or procedures, care coordination (communications with other health care  professionals or caregivers) and documentation in the medical record.

## 2023-08-15 ENCOUNTER — Ambulatory Visit (HOSPITAL_COMMUNITY)
Admission: EM | Admit: 2023-08-15 | Discharge: 2023-08-15 | Disposition: A | Discharge status: Home / Self Care | Payer: Medicare Other | Attending: Emergency Medicine | Admitting: Emergency Medicine

## 2023-08-15 ENCOUNTER — Encounter (HOSPITAL_COMMUNITY): Payer: Self-pay | Admitting: Emergency Medicine

## 2023-08-15 DIAGNOSIS — N3000 Acute cystitis without hematuria: Secondary | ICD-10-CM | POA: Insufficient documentation

## 2023-08-15 LAB — POCT URINALYSIS DIP (MANUAL ENTRY)
Bilirubin, UA: NEGATIVE
Glucose, UA: NEGATIVE mg/dL
Ketones, POC UA: NEGATIVE mg/dL
Leukocytes, UA: NEGATIVE
Nitrite, UA: NEGATIVE
Protein Ur, POC: NEGATIVE mg/dL
Spec Grav, UA: 1.015 (ref 1.010–1.025)
Urobilinogen, UA: 0.2 U/dL
pH, UA: 5 (ref 5.0–8.0)

## 2023-08-15 MED ORDER — SULFAMETHOXAZOLE-TRIMETHOPRIM 800-160 MG PO TABS: 1.0000 | ORAL_TABLET | Freq: Two times a day (BID) | ORAL | 0 refills | Status: DC

## 2023-08-15 NOTE — ED Triage Notes (Signed)
Patient c/o possible UTI, no urgency, just the feeling of feeling "full".  Denies any dysuria, frequency or hematuria x 3 days.  No OTC meds.

## 2023-08-15 NOTE — ED Provider Notes (Signed)
[facility]    CSN: [id number] Arrival date & time: 08/15/23  1609      History   Chief Complaint Chief Complaint  Patient presents with   Possible UTI    HPI Savannah [name] is a 67 y.o. female.   Patient presents to clinic for complaints of bladder fullness and the feeling of incomplete emptying that has been present since Tuesday.  After urinating she will still feel 'full.'   She denies dysuria, hematuria, flank pain, nausea or vomiting. No fevers.  Hx of breast cancer, on anti-estrogen therapy.     The history is provided by the patient and medical records.    Past Medical History:  Diagnosis Date   Adhesive capsulitis    Anxiety    BPPV (benign paroxysmal positional vertigo)    Breast cancer (HCC) 2008   Endometriosis    Fibromyalgia    GERD (gastroesophageal reflux disease)    Hyperlipidemia    Lymphedema    Malaria 1996   Personal history of radiation therapy 2008   PONV (postoperative nausea and vomiting)    Shingles 2002    Patient Active Problem List   Diagnosis Date Noted   COVID-19 02/06/2023   Ductal carcinoma in situ (DCIS) of left breast 01/07/2023   Encntr for general adult medical exam w/o abnormal findings 11/26/2022   Anxiety 11/26/2022   Arthritis 11/14/2022   Hyperlipidemia 11/14/2022   Irritable bowel syndrome 11/14/2022   Throat fullness 11/14/2022   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    Past Surgical History:  Procedure Laterality Date   BILATERAL SALPINGOOPHORECTOMY     BREAST BIOPSY Left 12/27/2022   MM LT BREAST BX W LOC DEV 1ST LESION IMAGE BX SPEC STEREO GUIDE 12/27/2022 [facility]   BREAST BIOPSY Left 12/27/2022   MM LT BREAST BX W LOC DEV EA AD LESION IMG BX SPEC STEREO GUIDE 12/27/2022 [facility]   BREAST BIOPSY  01/27/2023   MM LT RADIOACTIVE SEED LOC MAMMO GUIDE 01/27/2023 [facility]   BREAST BIOPSY  01/27/2023   MM LT RADIOACTIVE SEED EA ADD LESION LOC MAMMO GUIDE  01/27/2023 [facility]   BREAST LUMPECTOMY Right 2008   with right axillary node resection   BREAST LUMPECTOMY WITH RADIOACTIVE SEED LOCALIZATION Left 01/28/2023   Procedure: LEFT BREAST BRACKETED LUMPECTOMY WITH RADIOACTIVE SEED LOCALIZATION;  Surgeon: Harriette Bouillon, MD;  Location: [facility];  Service: General;  Laterality: Left;   CHOLECYSTECTOMY     LAPAROSCOPY     MASS EXCISION Right 04/30/2018   Procedure: EXCISION CYST DEBRIDEMENT INTERPHALANGEAL JOINT RIGHT THUMB;  Surgeon: Cindee Salt, MD;  Location: [facility];  Service: Orthopedics;  Laterality: Right;   SIMPLE MASTECTOMY WITH AXILLARY SENTINEL NODE BIOPSY Left 02/21/2023   Procedure: LEFT SIMPLE MASTECTOMY;  Surgeon: Harriette Bouillon, MD;  Location: [facility];  Service: General;  Laterality: Left;   TONSILLECTOMY     TOTAL KNEE ARTHROPLASTY Bilateral 07/2018, 10/2018   VAGINAL HYSTERECTOMY      OB History   No obstetric history on file.      Home Medications    Prior to Admission medications   Medication Sig Start Date End Date Taking? Authorizing Provider  ALPRAZolam (XANAX) 0.25 MG tablet Take 1-2 tablets (0.25-0.5 mg total) by mouth 3 (three) times daily as needed for anxiety. 02/19/23  Yes Early, Sung Amabile, NP  atorvastatin (LIPITOR) 10 MG tablet TAKE 1 TABLET BY MOUTH EVERY DAY 06/26/23  Yes Early, Huntley Dec  E, NP  cetirizine (ZYRTEC) 10 MG tablet Take 10 mg by mouth daily.   Yes [provider]  Cholecalciferol (VITAMIN D) 125 MCG (5000 UT) CAPS Take 1 capsule by mouth daily.   Yes [provider]  diphenhydrAMINE (BENADRYL) 25 mg capsule Take 50 mg by mouth at bedtime.   Yes [provider]  fluticasone (FLONASE) 50 MCG/ACT nasal spray Place 2 sprays into both nostrils daily.   Yes [provider]  hydrOXYzine (ATARAX) 25 MG tablet Take 1 tablet (25 mg total) by mouth at bedtime as needed and may repeat dose one time if needed for anxiety  (sleep). 02/19/23  Yes Early, Sung Amabile, NP  IRON, FERROUS GLUCONATE, PO Take 65 mg by mouth daily.   Yes [provider]  letrozole (FEMARA) 2.5 MG tablet Take 1 tablet (2.5 mg total) by mouth daily. 04/03/23  Yes [name], MD  MAGNESIUM CITRATE PO Take 400 mg by mouth daily.   Yes [provider]  sulfamethoxazole-trimethoprim (BACTRIM DS) 800-160 MG tablet Take 1 tablet by mouth 2 (two) times daily for 3 days. 08/15/23 08/18/23 Yes Rinaldo Ratel, [place] N, FNP  traMADol (ULTRAM) 50 MG tablet Take 1 tablet (50 mg total) by mouth every 8 (eight) hours as needed. 02/06/23  Yes Early, Sung Amabile, NP    Family History Family History  Problem Relation Age of Onset   Cancer Mother    Rectal cancer Mother    Stroke Mother    Heart disease Father    Heart attack Father    Crohn's disease Maternal Uncle    Liver disease Neg Hx    Colon polyps Neg Hx    Breast cancer Neg Hx     Social History Social History   Tobacco Use   Smoking status: Never   Smokeless tobacco: Never  Vaping Use   Vaping status: Never Used  Substance Use Topics   Alcohol use: Yes    Comment: 2 glasses of wine with dinner, 3-4x/week   Drug use: Never     Allergies   Darvon   Review of Systems Review of Systems  Constitutional:  Negative for fever.  Genitourinary:  Negative for decreased urine volume, difficulty urinating, dysuria and flank pain.  Musculoskeletal:  Negative for back pain.     Physical Exam Triage Vital Signs ED Triage Vitals  Encounter Vitals Group     BP 08/15/23 1717 125/76     Systolic BP Percentile --      Diastolic BP Percentile --      Pulse Rate 08/15/23 1717 68     Resp 08/15/23 1717 18     Temp 08/15/23 1717 98.3 F (36.8 C)     Temp Source 08/15/23 1717 Oral     SpO2 08/15/23 1717 97 %     Weight 08/15/23 1719 168 lb (76.2 kg)     Height 08/15/23 1719 5\' 6"  (1.676 m)     Head Circumference --      Peak Flow --      Pain Score 08/15/23 1719 3     Pain Loc --       Pain Education --      Exclude from Growth Chart --    No data found.  Updated Vital Signs BP 125/76 (BP Location: Left Arm)   Pulse 68   Temp 98.3 F (36.8 C) (Oral)   Resp 18   Ht 5\' 6"  (1.676 m)   Wt 168 lb (76.2 kg)  SpO2 97%   BMI 27.12 kg/m   Visual Acuity Right Eye Distance:   Left Eye Distance:   Bilateral Distance:    Right Eye Near:   Left Eye Near:    Bilateral Near:     Physical Exam Vitals and nursing note reviewed.  Constitutional:      Appearance: Normal appearance.  HENT:     Head: Normocephalic and atraumatic.     Right Ear: External ear normal.     Left Ear: External ear normal.     Nose: Nose normal.     Mouth/Throat:     Mouth: Mucous membranes are moist.  Eyes:     Conjunctiva/sclera: Conjunctivae normal.  Cardiovascular:     Rate and Rhythm: Normal rate.  Pulmonary:     Effort: Pulmonary effort is normal. No respiratory distress.  Abdominal:     General: Abdomen is flat. Bowel sounds are normal. There is no distension.     Palpations: Abdomen is soft.     Tenderness: There is no abdominal tenderness. There is no right CVA tenderness or left CVA tenderness.  Musculoskeletal:        General: Normal range of motion.  Skin:    General: Skin is warm and dry.  Neurological:     General: No focal deficit present.     Mental Status: She is alert and oriented to person, place, and time.  Psychiatric:        Mood and Affect: Mood normal.        Behavior: Behavior normal.      UC Treatments / Results  Labs (all labs ordered are listed, but only abnormal results are displayed) Labs Reviewed  POCT URINALYSIS DIP (MANUAL ENTRY) - Abnormal; Notable for the following components:      Result Value   Blood, UA trace-intact (*)    All other components within normal limits  URINE CULTURE    EKG   Radiology No results found.  Procedures Procedures (including critical care time)  Medications Ordered in UC Medications - No data to  display  Initial Impression / Assessment and Plan / UC Course  I have reviewed the triage vital signs and the nursing notes.  Pertinent labs & imaging results that were available during my care of the patient were reviewed by me and considered in my medical decision making (see chart for details).  Vitals and triage reviewed, patient is hemodynamically stable.  Abdomen is soft and nontender with active bowel sounds.  Negative for CVA tenderness.  Reports bladder pressure 1 suprapubic fullness, feeling of incomplete emptying.  Urinalysis significant for red blood cells, will send for culture.  Will treat with Bactrim for acute cystitis.  Symptomatic management discussed.  Plan of care, follow-up care and return precautions given, no questions at this time.     Final Clinical Impressions(s) / UC Diagnoses   Final diagnoses:  Acute cystitis without hematuria     Discharge Instructions      Take antibiotics as prescribed and until finished, take them twice daily with food.  Ensure you are drinking at least 64 ounces of water daily and avoiding juices and sodas.  If you develop any burning or stinging you can take over-the-counter Azo or Pyridium, this will change the color of your secretions to a bright orange color.  Follow-up with your primary care provider or return to clinic if you do not have resolution in symptoms despite finishing antibiotics, develop fever, nausea, flank pain, any new concerning symptoms.  ED Prescriptions     Medication Sig Dispense Auth. Provider   sulfamethoxazole-trimethoprim (BACTRIM DS) 800-160 MG tablet Take 1 tablet by mouth 2 (two) times daily for 3 days. 6 tablet Hasheem Voland, [place] N, [place]      PDMP not reviewed this encounter.   Rosibel Giacobbe, [place] N, [place] 08/15/23 [phone]

## 2023-08-15 NOTE — Discharge Instructions (Addendum)
Take antibiotics as prescribed and until finished, take them twice daily with food.  Ensure you are drinking at least 64 ounces of water daily and avoiding juices and sodas.  If you develop any burning or stinging you can take over-the-counter Azo or Pyridium, this will change the color of your secretions to a bright orange color.  Follow-up with your primary care provider or return to clinic if you do not have resolution in symptoms despite finishing antibiotics, develop fever, nausea, flank pain, any new concerning symptoms.

## 2023-08-16 LAB — URINE CULTURE

## 2023-08-18 ENCOUNTER — Encounter: Payer: Self-pay | Admitting: Nurse Practitioner

## 2023-08-18 ENCOUNTER — Ambulatory Visit (INDEPENDENT_AMBULATORY_CARE_PROVIDER_SITE_OTHER): Payer: Medicare Other | Admitting: Nurse Practitioner

## 2023-08-18 VITALS — BP 132/74 | HR 74 | Wt 167.6 lb

## 2023-08-18 DIAGNOSIS — Z79899 Other long term (current) drug therapy: Secondary | ICD-10-CM | POA: Diagnosis not present

## 2023-08-18 DIAGNOSIS — E785 Hyperlipidemia, unspecified: Secondary | ICD-10-CM | POA: Insufficient documentation

## 2023-08-18 DIAGNOSIS — R102 Pelvic and perineal pain: Secondary | ICD-10-CM

## 2023-08-18 DIAGNOSIS — E559 Vitamin D deficiency, unspecified: Secondary | ICD-10-CM | POA: Insufficient documentation

## 2023-08-18 DIAGNOSIS — E782 Mixed hyperlipidemia: Secondary | ICD-10-CM

## 2023-08-18 DIAGNOSIS — R5383 Other fatigue: Secondary | ICD-10-CM | POA: Diagnosis not present

## 2023-08-18 DIAGNOSIS — M797 Fibromyalgia: Secondary | ICD-10-CM | POA: Insufficient documentation

## 2023-08-18 DIAGNOSIS — K219 Gastro-esophageal reflux disease without esophagitis: Secondary | ICD-10-CM | POA: Insufficient documentation

## 2023-08-18 DIAGNOSIS — M179 Osteoarthritis of knee, unspecified: Secondary | ICD-10-CM | POA: Insufficient documentation

## 2023-08-18 HISTORY — DX: Pelvic and perineal pain: R10.2

## 2023-08-18 MED ORDER — CIPROFLOXACIN HCL 500 MG PO TABS: 500.0000 mg | ORAL_TABLET | Freq: Two times a day (BID) | ORAL | 0 refills | Status: AC

## 2023-08-18 NOTE — Assessment & Plan Note (Signed)
Chronic. Labs pending today. No alarm symptoms are present. Currently managed with diet. Will make changes as needed based on labs.

## 2023-08-18 NOTE — Progress Notes (Signed)
Tollie Eth, DNP, AGNP-c [facility] [address] [place], [place] [address] [phone]   ACUTE VISIT- ESTABLISHED PATIENT  Blood pressure 132/74, pulse 74, weight 167 lb 9.6 oz (76 kg).  Subjective:  HPI Savannah [name] is a 67 y.o. female presents to day for evaluation of acute concern(s).   Raynelle Fanning tells me she is having suprapubic pressure and generally feels unwell. She was seen in Urgent Care on Friday and treated with Bactrim for possible UTI, but the symptoms are persisting. She denies back pain, fevers, dysuria, or small volume voiding. The urgent care did find blood in her urine, but no bacteria. The culture was inconclusive.   She tells me she had a lumpectomy in February and Mastectomy in March/April and since then she has felt very tired. She tells me she did have COVID in between that time. She tells me the level of exhaustion is so significant that with exertion she needs to stop to rest frequently. This is far worse than her baseline.   ROS negative except for what is listed in HPI. History, Medications, Surgery, SDOH, and Family History reviewed and updated as appropriate.  Objective:  Physical Exam Vitals and nursing note reviewed.  Constitutional:      General: She is not in acute distress.    Appearance: Normal appearance.  Eyes:     Conjunctiva/sclera: Conjunctivae normal.  Neck:     Vascular: No carotid bruit.  Cardiovascular:     Rate and Rhythm: Normal rate and regular rhythm.     Pulses: Normal pulses.     Heart sounds: Normal heart sounds.  Pulmonary:     Effort: Pulmonary effort is normal.     Breath sounds: Normal breath sounds.  Abdominal:     General: Bowel sounds are normal. There is no distension.     Palpations: Abdomen is soft.     Tenderness: There is no abdominal tenderness. There is no right CVA tenderness, left CVA tenderness or guarding.  Musculoskeletal:     Right lower leg: No edema.     Left lower leg: No  edema.  Skin:    General: Skin is warm and dry.     Capillary Refill: Capillary refill takes less than 2 seconds.  Neurological:     Mental Status: She is alert and oriented to person, place, and time.  Psychiatric:        Behavior: Behavior normal.         Assessment & Plan:   Problem List Items Addressed This Visit     Hyperlipidemia    Chronic. Labs pending today. No alarm symptoms are present. Currently managed with diet. Will make changes as needed based on labs.       Relevant Orders   Vitamin B12   CBC with Differential/Platelet   Iron, TIBC and Ferritin Panel   VITAMIN D 25 Hydroxy (Vit-D Deficiency, Fractures)   Comprehensive metabolic panel   Lipid panel   Suprapubic pressure - Primary    Suprapubic pressure with no relief with 3 days of bactrim PO. Unable to obtain a urine specimen today. Reviewed recent culture which showed mixed flora present. At this time it is unclear what is causing her symptoms. We will trial a course of cipro to see if this is helpful for symptoms. If symptoms persist, we can consider an [place] to ensure that there are no concerning findings present.       Relevant Medications   ciprofloxacin (CIPRO) 500 MG tablet  Other Relevant Orders   Vitamin B12   CBC with Differential/Platelet   Iron, TIBC and Ferritin Panel   VITAMIN D 25 Hydroxy (Vit-D Deficiency, Fractures)   Comprehensive metabolic panel   Lipid panel   Fatigue    Fatigue and generalized feeling of unwell present for several months. We discussed that this could be residual fatigue from two surgical procedures close together at the beginning of the year and COVID infection at the same time, but it is difficult to say for sure.We will monitor labs today to ensure there are no underlying concerns present that could be contributing.       Relevant Orders   Vitamin B12   CBC with Differential/Platelet   Iron, TIBC and Ferritin Panel   VITAMIN D 25 Hydroxy (Vit-D Deficiency,  Fractures)   Comprehensive metabolic panel   Lipid panel   Other Visit Diagnoses     Vitamin D deficiency       Relevant Orders   Vitamin B12   CBC with Differential/Platelet   Iron, TIBC and Ferritin Panel   VITAMIN D 25 Hydroxy (Vit-D Deficiency, Fractures)   Comprehensive metabolic panel   Lipid panel   High risk medication use       Relevant Orders   Vitamin B12   CBC with Differential/Platelet   Iron, TIBC and Ferritin Panel   VITAMIN D 25 Hydroxy (Vit-D Deficiency, Fractures)   Comprehensive metabolic panel   Lipid panel        Tollie Eth, DNP, AGNP-c

## 2023-08-18 NOTE — Assessment & Plan Note (Signed)
Suprapubic pressure with no relief with 3 days of bactrim PO. Unable to obtain a urine specimen today. Reviewed recent culture which showed mixed flora present. At this time it is unclear what is causing her symptoms. We will trial a course of cipro to see if this is helpful for symptoms. If symptoms persist, we can consider an [place] to ensure that there are no concerning findings present.

## 2023-08-18 NOTE — Assessment & Plan Note (Signed)
Fatigue and generalized feeling of unwell present for several months. We discussed that this could be residual fatigue from two surgical procedures close together at the beginning of the year and COVID infection at the same time, but it is difficult to say for sure.We will monitor labs today to ensure there are no underlying concerns present that could be contributing.

## 2023-08-18 NOTE — Patient Instructions (Signed)
We will start the cipro and monitor to see if your symptoms improve. Keep drinking plenty of water.   If this is not better in the next 2-3 days please let me know.

## 2023-08-19 LAB — VITAMIN D 25 HYDROXY (VIT D DEFICIENCY, FRACTURES): Vit D, 25-Hydroxy: 37.4 ng/mL (ref 30.0–100.0)

## 2023-08-19 LAB — LIPID PANEL
Chol/HDL Ratio: 2.5 ratio (ref 0.0–4.4)
Cholesterol, Total: 203 mg/dL — ABNORMAL HIGH (ref 100–199)
HDL: 81 mg/dL (ref >39)
LDL Chol Calc (NIH): 107 mg/dL — ABNORMAL HIGH (ref 0–99)
Triglycerides: 84 mg/dL (ref 0–149)
VLDL Cholesterol Cal: 15 mg/dL (ref 5–40)

## 2023-08-19 LAB — CBC WITH DIFFERENTIAL/PLATELET
Basophils Absolute: 0 10*3/uL (ref 0.0–0.2)
Basos: 1 %
EOS (ABSOLUTE): 0.1 10*3/uL (ref 0.0–0.4)
Eos: 1 %
Hematocrit: 39.1 % (ref 34.0–46.6)
Hemoglobin: 12.8 g/dL (ref 11.1–15.9)
Immature Grans (Abs): 0 10*3/uL (ref 0.0–0.1)
Immature Granulocytes: 0 %
Lymphocytes Absolute: 1.1 10*3/uL (ref 0.7–3.1)
Lymphs: 23 %
MCH: 28.5 pg (ref 26.6–33.0)
MCHC: 32.7 g/dL (ref 31.5–35.7)
MCV: 87 fL (ref 79–97)
Monocytes Absolute: 0.4 10*3/uL (ref 0.1–0.9)
Monocytes: 8 %
Neutrophils Absolute: 3.3 10*3/uL (ref 1.4–7.0)
Neutrophils: 67 %
Platelets: 293 10*3/uL (ref 150–450)
RBC: 4.49 x10E6/uL (ref 3.77–5.28)
RDW: 13.2 % (ref 11.7–15.4)
WBC: 5 10*3/uL (ref 3.4–10.8)

## 2023-08-19 LAB — COMPREHENSIVE METABOLIC PANEL
ALT: 18 IU/L (ref 0–32)
AST: 22 IU/L (ref 0–40)
Albumin: 4.7 g/dL (ref 3.9–4.9)
Alkaline Phosphatase: 60 IU/L (ref 44–121)
BUN/Creatinine Ratio: 14 (ref 12–28)
BUN: 12 mg/dL (ref 8–27)
Bilirubin Total: 0.5 mg/dL (ref 0.0–1.2)
CO2: 23 mmol/L (ref 20–29)
Calcium: 9.9 mg/dL (ref 8.7–10.3)
Chloride: 100 mmol/L (ref 96–106)
Creatinine, Ser: 0.83 mg/dL (ref 0.57–1.00)
Globulin, Total: 1.6 g/dL (ref 1.5–4.5)
Glucose: 94 mg/dL (ref 70–99)
Potassium: 4.2 mmol/L (ref 3.5–5.2)
Sodium: 138 mmol/L (ref 134–144)
Total Protein: 6.3 g/dL (ref 6.0–8.5)
eGFR: 77 mL/min/{1.73_m2} (ref >59)

## 2023-08-19 LAB — VITAMIN B12: Vitamin B-12: 516 pg/mL (ref 232–1245)

## 2023-08-19 LAB — IRON,TIBC AND FERRITIN PANEL
Ferritin: 89 ng/mL (ref 15–150)
Iron Saturation: 32 % (ref 15–55)
Iron: 114 ug/dL (ref 27–139)
Total Iron Binding Capacity: 352 ug/dL (ref 250–450)
UIBC: 238 ug/dL (ref 118–369)

## 2023-08-26 ENCOUNTER — Other Ambulatory Visit: Payer: Self-pay | Admitting: Nurse Practitioner

## 2023-08-26 DIAGNOSIS — E559 Vitamin D deficiency, unspecified: Secondary | ICD-10-CM

## 2023-08-26 MED ORDER — VITAMIN D (ERGOCALCIFEROL) 1.25 MG (50000 UNIT) PO CAPS
50000.0000 [IU] | ORAL_CAPSULE | ORAL | 1 refills | Status: DC
Start: 2023-08-26 — End: 2023-12-30

## 2023-08-28 ENCOUNTER — Encounter: Payer: Self-pay | Admitting: Nurse Practitioner

## 2023-08-28 ENCOUNTER — Telehealth: Payer: Self-pay

## 2023-08-28 NOTE — Telephone Encounter (Signed)
Pt is currently taking Cipro. States she is having severe pain in her hand that may be one of the side effects. She is scheduled to come in Monday 09/01/23. Please advise.

## 2023-08-29 ENCOUNTER — Ambulatory Visit (HOSPITAL_BASED_OUTPATIENT_CLINIC_OR_DEPARTMENT_OTHER)
Admission: RE | Admit: 2023-08-29 | Discharge: 2023-08-29 | Disposition: A | Discharge status: Home / Self Care | Payer: Medicare Other | Source: Ambulatory Visit | Attending: Adult Health | Admitting: Adult Health

## 2023-08-29 DIAGNOSIS — Z79811 Long term (current) use of aromatase inhibitors: Secondary | ICD-10-CM | POA: Diagnosis present

## 2023-08-29 DIAGNOSIS — E559 Vitamin D deficiency, unspecified: Secondary | ICD-10-CM | POA: Diagnosis not present

## 2023-08-29 DIAGNOSIS — Z13820 Encounter for screening for osteoporosis: Secondary | ICD-10-CM | POA: Diagnosis not present

## 2023-08-29 DIAGNOSIS — Z853 Personal history of malignant neoplasm of breast: Secondary | ICD-10-CM | POA: Diagnosis not present

## 2023-09-01 ENCOUNTER — Ambulatory Visit: Payer: Medicare Other | Admitting: Nurse Practitioner

## 2023-10-28 ENCOUNTER — Telehealth: Payer: Medicare Other | Admitting: Medical

## 2023-10-28 ENCOUNTER — Telehealth: Payer: Self-pay | Admitting: Nurse Practitioner

## 2023-10-28 DIAGNOSIS — U071 COVID-19: Secondary | ICD-10-CM

## 2023-10-28 DIAGNOSIS — R051 Acute cough: Secondary | ICD-10-CM | POA: Diagnosis not present

## 2023-10-28 MED ORDER — NIRMATRELVIR/RITONAVIR (PAXLOVID)TABLET: 3.0000 | ORAL_TABLET | Freq: Two times a day (BID) | ORAL | 0 refills | Status: AC

## 2023-10-28 NOTE — Telephone Encounter (Signed)
Pt called and stated she had a virtual with you this morning and she has decided she does want the paxlovid and it can be sent to [id number] - [place], [place] - [address]

## 2023-10-28 NOTE — Progress Notes (Signed)
Subjective:     Patient ID: Savannah [name], female   DOB: [birth year], 67 y.o.   MRN: [id number]  This visit type was conducted due to national recommendations for restrictions regarding the COVID-19 Pandemic (e.g. social distancing) in an effort to limit this patient's exposure and mitigate transmission in our community.  Due to their co-morbid illnesses, this patient is at least at moderate risk for complications without adequate follow up.  This format is felt to be most appropriate for this patient at this time.    Documentation for virtual audio and video telecommunications through [place] encounter:  The patient was located at home. The provider was located in the office. The patient did consent to this visit and is aware of possible charges through their insurance for this visit.  The other persons participating in this telemedicine service were none. Time spent on call was 20 minutes and in review of previous records 20 minutes total.  This virtual service is not related to other E/M service within previous 7 days.   HPI Chief Complaint  Patient presents with   Cough   Virtual for cough, not feeling well.  Symptoms began Saturday.  Has phlegm.  The first day had lots of runny nose.  Yesterday morning felt fine, but yesterday afternoon had runny nose, headache, scratchy throat.   No fever.  No particular body aches or chills.   No SOB, no wheezing.   No NVD.  No hemoptysis.    Took covid test yesterday afternoon and it was positive  Doesn't feel all that bad, so symptoms are more mild.   Using some tylenol.    Had covid in spring, took paxlovid then.  She had some upset digestive symptom then, but was feeling bad in general due to the fact she had recent lumpectomy and dealing with surgery then too.    No other aggravating or relieving factors. No other complaint.   Past Medical History:  Diagnosis Date   Adhesive capsulitis    Anxiety    BPPV (benign paroxysmal  positional vertigo)    Breast cancer (HCC) 2008   Endometriosis    Fibromyalgia    GERD (gastroesophageal reflux disease)    Hyperlipidemia    Lymphedema    Malaria 1996   Personal history of radiation therapy 2008   PONV (postoperative nausea and vomiting)    Shingles 2002   Current Outpatient Medications on File Prior to Visit  Medication Sig Dispense Refill   ALPRAZolam (XANAX) 0.25 MG tablet Take 1-2 tablets (0.25-0.5 mg total) by mouth 3 (three) times daily as needed for anxiety. 20 tablet 0   atorvastatin (LIPITOR) 10 MG tablet TAKE 1 TABLET BY MOUTH EVERY DAY 90 tablet 1   cetirizine (ZYRTEC) 10 MG tablet Take 10 mg by mouth daily.     fluticasone (FLONASE) 50 MCG/ACT nasal spray Place 2 sprays into both nostrils daily.     hydrOXYzine (ATARAX) 25 MG tablet Take 1 tablet (25 mg total) by mouth at bedtime as needed and may repeat dose one time if needed for anxiety (sleep). 180 tablet 3   traMADol (ULTRAM) 50 MG tablet Take 1 tablet (50 mg total) by mouth every 8 (eight) hours as needed. 45 tablet 2   Vitamin D, Ergocalciferol, (DRISDOL) 1.25 MG (50000 UNIT) CAPS capsule Take 1 capsule (50,000 Units total) by mouth every 7 (seven) days. Stop your daily dose while taking. Restart daily dose when prescription is complete. 12 capsule 1   diphenhydrAMINE (BENADRYL) 25 mg  capsule Take 50 mg by mouth at bedtime.     IRON, FERROUS GLUCONATE, PO Take 65 mg by mouth daily.     letrozole (FEMARA) 2.5 MG tablet Take 1 tablet (2.5 mg total) by mouth daily. (Patient not taking: Reported on 10/28/2023) 90 tablet 3   MAGNESIUM CITRATE PO Take 400 mg by mouth daily.     No current facility-administered medications on file prior to visit.    Review of Systems As in subjective    Objective:   Physical Exam Due to coronavirus pandemic stay at home measures, patient visit was virtual and they were not examined in person.   There were no vitals taken for this visit.  Gen: wd, wn, nad No  labored breathing, no wheezing Mildly ill appearing       Assessment:     Encounter Diagnoses  Name Primary?   COVID Yes   Acute cough        Plan:     Covid infection, covid illness general recommendations:  I recommend you rest, hydrate well with water and clear fluids throughout the day.  If you feel dry in the mouth, tongue or feel that you are urinating as much as usual, then increase hydration.  You urine should be like yellow to clear, not dark yellow or darker.     Pain, body aches, or fever: You can use Tylenol /Acetaminophen 325mg  over the counter for pain or fever, every 4-6 hours  Cough: You can use over the counter Delsym for cough.   Drainage and congestion: You can use over the counter Sudafed, nasal decongestant, or Mucinex/Guaifenesin as directed on the label  Nausea: You can use over the counter Emetrol for nausea.    Antiviral medication: Consider the medication Paxlovid  to help reduce your risk of hospitalization or severe illness.  If medicaiton is not available, too expensive or not covered by insurance, then call us back.   Other supportive measures: I recommend extra vitamins to help your body fight the illness.   Consider over the counter vitamin pack such as EmergenC Immune Plus which contains extra vitamin C, vitamin D and zinc.     In the next few days, if you are having trouble breathing, if you are very weak, have high fever 103 or higher consistently despite Tylenol, or uncontrollable nausea and vomiting, then call or go to the emergency department.    If you have other questions or have other symptoms or questions you are concerned about then please make a virtual visit   Covid symptoms such as fatigue and cough can linger over 2 weeks, even after the initial fever, aches, chills, and other initial symptoms.   Self Quarantine: The CDC, Centers for Disease Control has recommended a self quarantine of 5 days from the start of your  illness until you are symptom-free including at least 24 hours of no symptoms including no fever, no shortness of breath, and no body aches and chills, by day 5 before returning to work or general contact with the public.  What does self quarantine mean: avoiding contact with people as much as possible.   Particularly in your house, isolate your self from others in a separate room, wear a mask when possible in the room, particularly if coughing a lot.   Have others bring food, water, medications, etc., to your door, but avoid direct contact with your household contacts during this time to avoid spreading the infection to them.   If you have  a separate bathroom and living quarters during the next 2 weeks away from others, that would be preferable.    If you can't completely isolate, then wear a mask, wash hands frequently with soap and water for at least 15 seconds, minimize close contact with others, and have a friend or family member check regularly from a distance to make sure you are not getting seriously worse.     You should not be going out in public, should not be going to stores, to work or other public places until all your symptoms have resolved and at least 5 days + 24 hours of no symptoms at all have transpired.   Ideally you should avoid contact with others for a full 5 days if possible.  One of the goals is to limit spread to high risk people; people that are older and elderly, people with multiple health issues like diabetes, heart disease, lung disease, and anybody that has weakened immune systems such as people with cancer or on immunosuppressive therapy.     Savannah "Raynelle Fanning" was seen today for cough.  Diagnoses and all orders for this visit:  COVID  Acute cough  Other orders -     nirmatrelvir/ritonavir (PAXLOVID) 20 x 150 MG & 10 x 100MG  TABS; Take 3 tablets by mouth 2 (two) times daily for 5 days. (Take nirmatrelvir 150 mg two tablets twice daily for 5 days and ritonavir 100 mg  one tablet twice daily for 5 days) Patient GFR is 77.    F/u prn

## 2023-11-11 ENCOUNTER — Ambulatory Visit: Payer: Medicare Other

## 2023-11-11 DIAGNOSIS — Z0000 Encounter for general adult medical examination without abnormal findings: Secondary | ICD-10-CM

## 2023-11-11 NOTE — Progress Notes (Signed)
Subjective:   Savannah [name] is a 67 y.o. female who presents for Medicare Annual (Subsequent) preventive examination.  Visit Complete: Virtual I connected with  Savannah [name] on 11/11/23 by a audio enabled telemedicine application and verified that I am speaking with the correct person using two identifiers.  Patient Location: Home  Provider Location: Office/Clinic  I discussed the limitations of evaluation and management by telemedicine. The patient expressed understanding and agreed to proceed.  Vital Signs: Because this visit was a virtual/telehealth visit, some criteria may be missing or patient reported. Any vitals not documented were not able to be obtained and vitals that have been documented are patient reported.  Patient Medicare AWV questionnaire was completed by the patient on 11/10/2023; I have confirmed that all information answered by patient is correct and no changes since this date.  Cardiac Risk Factors include: advanced age (>80men, >4 women)     Objective:    Today's Vitals   11/11/23 1356  PainSc: 4    There is no height or weight on file to calculate BMI.     11/11/2023    2:01 PM 03/14/2023   10:59 AM 03/04/2023   11:00 AM 02/21/2023    8:50 AM 02/13/2023   12:50 PM 01/28/2023    7:36 AM 01/17/2023    9:19 AM  Advanced Directives  Does Patient Have a Medical Advance Directive? Yes Yes Yes Yes Yes Yes Yes  Type of Estate agent of [place];Living will Healthcare Power of [employer] of [place];Living will Healthcare Power of [place];Living will Healthcare Power of [place];Living will Healthcare Power of [place];Living will Healthcare Power of [place];Living will  Does patient want to make changes to medical advance directive?  No - Patient declined  No - Patient declined No - Patient declined No - Patient declined   Copy of Healthcare Power of Attorney in Chart? No - copy requested No - copy requested  No - copy  requested No - copy requested      Current Medications (verified) Outpatient Encounter Medications as of 11/11/2023  Medication Sig   ALPRAZolam (XANAX) 0.25 MG tablet Take 1-2 tablets (0.25-0.5 mg total) by mouth 3 (three) times daily as needed for anxiety.   atorvastatin (LIPITOR) 10 MG tablet TAKE 1 TABLET BY MOUTH EVERY DAY   cetirizine (ZYRTEC) 10 MG tablet Take 10 mg by mouth daily.   Cholecalciferol (VITAMIN D3) 50 MCG (2000 UT) TABS Take 1 tablet by mouth daily.   fluticasone (FLONASE) 50 MCG/ACT nasal spray Place 2 sprays into both nostrils daily.   hydrOXYzine (ATARAX) 25 MG tablet Take 1 tablet (25 mg total) by mouth at bedtime as needed and may repeat dose one time if needed for anxiety (sleep).   traMADol (ULTRAM) 50 MG tablet Take 1 tablet (50 mg total) by mouth every 8 (eight) hours as needed.   diphenhydrAMINE (BENADRYL) 25 mg capsule Take 50 mg by mouth at bedtime.   IRON, FERROUS GLUCONATE, PO Take 65 mg by mouth daily.   letrozole (FEMARA) 2.5 MG tablet Take 1 tablet (2.5 mg total) by mouth daily. (Patient not taking: Reported on 10/28/2023)   MAGNESIUM CITRATE PO Take 400 mg by mouth daily.   Vitamin D, Ergocalciferol, (DRISDOL) 1.25 MG (50000 UNIT) CAPS capsule Take 1 capsule (50,000 Units total) by mouth every 7 (seven) days. Stop your daily dose while taking. Restart daily dose when prescription is complete. (Patient not taking: Reported on 11/11/2023)   No facility-administered encounter medications on file  as of 11/11/2023.    Allergies (verified) Darvon   History: Past Medical History:  Diagnosis Date   Adhesive capsulitis    Anxiety    BPPV (benign paroxysmal positional vertigo)    Breast cancer (HCC) 2008   Endometriosis    Fibromyalgia    GERD (gastroesophageal reflux disease)    Hyperlipidemia    Lymphedema    Malaria 1996   Personal history of radiation therapy 2008   PONV (postoperative nausea and vomiting)    Shingles 2002   Past Surgical  History:  Procedure Laterality Date   BILATERAL SALPINGOOPHORECTOMY     BREAST BIOPSY Left 12/27/2022   MM LT BREAST BX W LOC DEV 1ST LESION IMAGE BX SPEC STEREO GUIDE 12/27/2022 [facility]   BREAST BIOPSY Left 12/27/2022   MM LT BREAST BX W LOC DEV EA AD LESION IMG BX SPEC STEREO GUIDE 12/27/2022 [facility]   BREAST BIOPSY  01/27/2023   MM LT RADIOACTIVE SEED LOC MAMMO GUIDE 01/27/2023 [facility]   BREAST BIOPSY  01/27/2023   MM LT RADIOACTIVE SEED EA ADD LESION LOC MAMMO GUIDE 01/27/2023 [facility]   BREAST LUMPECTOMY Right 2008   with right axillary node resection   BREAST LUMPECTOMY WITH RADIOACTIVE SEED LOCALIZATION Left 01/28/2023   Procedure: LEFT BREAST BRACKETED LUMPECTOMY WITH RADIOACTIVE SEED LOCALIZATION;  Surgeon: Harriette Bouillon, MD;  Location: [facility];  Service: General;  Laterality: Left;   CHOLECYSTECTOMY     LAPAROSCOPY     MASS EXCISION Right 04/30/2018   Procedure: EXCISION CYST DEBRIDEMENT INTERPHALANGEAL JOINT RIGHT THUMB;  Surgeon: Cindee Salt, MD;  Location: [facility];  Service: Orthopedics;  Laterality: Right;   SIMPLE MASTECTOMY WITH AXILLARY SENTINEL NODE BIOPSY Left 02/21/2023   Procedure: LEFT SIMPLE MASTECTOMY;  Surgeon: Harriette Bouillon, MD;  Location: [facility];  Service: General;  Laterality: Left;   TONSILLECTOMY     TOTAL KNEE ARTHROPLASTY Bilateral 07/2018, 10/2018   VAGINAL HYSTERECTOMY     Family History  Problem Relation Age of Onset   Cancer Mother    Rectal cancer Mother    Stroke Mother    Heart disease Father    Heart attack Father    Crohn's disease Maternal Uncle    Liver disease Neg Hx    Colon polyps Neg Hx    Breast cancer Neg Hx    Social History   Socioeconomic History   Marital status: Single    Spouse name: Not on file   Number of children: Not on file   Years of education: Not on file   Highest education level: Not on file  Occupational History   Not  on file  Tobacco Use   Smoking status: Never   Smokeless tobacco: Never  Vaping Use   Vaping status: Never Used  Substance and Sexual Activity   Alcohol use: Yes    Comment: 2 glasses of wine with dinner, 3-4x/week   Drug use: Never   Sexual activity: Not Currently    Birth control/protection: Surgical  Other Topics Concern   Not on file  Social History Narrative   Chiropractor   Social Determinants of Health   Financial Resource Strain: Medium Risk (11/10/2023)   Overall Financial Resource Strain (CARDIA)    Difficulty of Paying Living Expenses: Somewhat hard  Food Insecurity: No Food Insecurity (11/10/2023)   Hunger Vital Sign    Worried About Running Out of Food in the Last Year: Never true    Ran  Out of Food in the Last Year: Never true  Transportation Needs: No Transportation Needs (11/10/2023)   PRAPARE - Administrator, Civil Service (Medical): No    Lack of Transportation (Non-Medical): No  Physical Activity: Insufficiently Active (11/10/2023)   Exercise Vital Sign    Days of Exercise per Week: 3 days    Minutes of Exercise per Session: 40 min  Stress: Stress Concern Present (11/10/2023)   [employer] of Occupational Health - Occupational Stress Questionnaire    Feeling of Stress : To some extent  Social Connections: Unknown (11/10/2023)   Social Connection and Isolation Panel [NHANES]    Frequency of Communication with Friends and Family: More than three times a week    Frequency of Social Gatherings with Friends and Family: More than three times a week    Attends Religious Services: Not on Marketing executive or Organizations: Yes    Attends Engineer, structural: More than 4 times per year    Marital Status: Never married    Tobacco Counseling Counseling given: Not Answered   Clinical Intake:  Pre-visit preparation completed: Yes  Pain : 0-10 Pain Score: 4  Pain Type: Chronic pain Pain Location: Knee Pain  Orientation: Left, Right Pain Descriptors / Indicators: Aching Pain Onset: More than a month ago Pain Frequency: Constant     Nutritional Risks: None Diabetes: No  How often do you need to have someone help you when you read instructions, pamphlets, or other written materials from your doctor or pharmacy?: 1 - Never  Interpreter Needed?: No  Information entered by :: NAllen LPN   Activities of Daily Living    11/10/2023    2:40 PM 02/21/2023    8:54 AM  In your present state of health, do you have any difficulty performing the following activities:  Hearing? 0 0  Vision? 0 0  Difficulty concentrating or making decisions? 0 0  Walking or climbing stairs? 0 0  Dressing or bathing? 0 0  Doing errands, shopping? 0   Preparing Food and eating ? N   Using the Toilet? N   In the past six months, have you accidently leaked urine? Y   Comment sometimes   Do you have problems with loss of bowel control? N   Managing your Medications? N   Managing your Finances? N   Housekeeping or managing your Housekeeping? N     Patient Care Team: Early, Sung Amabile, NP as PCP - General (Nurse Practitioner) [name], MD as Consulting Physician (Hematology and Oncology) Harriette Bouillon, MD as Consulting Physician (General Surgery)  Indicate any recent Medical Services you may have received from other than [facility] providers in the past year (date may be approximate).     Assessment:   This is a routine wellness examination for Shatika.  Hearing/Vision screen Hearing Screening - Comments:: Denies hearing issues Vision Screening - Comments:: Regular eye exams, [facility]   Goals Addressed             This Visit's Progress    Patient Stated       11/11/2023, wants to lose weight       Depression Screen    11/11/2023    2:03 PM 11/07/2022   11:08 AM 09/20/2022    2:05 PM 07/04/2022    1:54 PM 09/10/2021    1:53 PM 04/25/2021   10:49 AM 03/19/2021    3:39 PM  PHQ 2/9 Scores   PHQ -  2 Score 1 0 0 0 0 0 0  PHQ- 9 Score 1  3        Fall Risk    11/10/2023    2:40 PM 11/07/2022   11:07 AM 09/20/2022    2:05 PM 07/04/2022    1:54 PM 09/10/2021    1:53 PM  Fall Risk   Falls in the past year? 0 0 0 0 0  Number falls in past yr: 0 0 0 0 0  Injury with Fall? 0 0 0 0 0  Risk for fall due to : Medication side effect No Fall Risks Medication side effect No Fall Risks History of fall(s)  Follow up Falls prevention discussed;Falls evaluation completed Falls evaluation completed Falls prevention discussed;Education provided;Falls evaluation completed Falls evaluation completed Falls prevention discussed;Falls evaluation completed    MEDICARE RISK AT HOME: Medicare Risk at Home Any stairs in or around the home?: Yes If so, are there any without handrails?: No Home free of loose throw rugs in walkways, pet beds, electrical cords, etc?: Yes Adequate lighting in your home to reduce risk of falls?: Yes Life alert?: No Use of a cane, walker or w/c?: No Grab bars in the bathroom?: Yes Shower chair or bench in shower?: No Elevated toilet seat or a handicapped toilet?: Yes  TIMED UP AND GO:  Was the test performed?  No    Cognitive Function:        11/11/2023    2:04 PM 09/20/2022    2:09 PM  6CIT Screen  What Year? 0 points 0 points  What month? 0 points 0 points  What time? 0 points 0 points  Count back from 20 0 points 0 points  Months in reverse 0 points 0 points  Repeat phrase 0 points 2 points  Total Score 0 points 2 points    Immunizations Immunization History  Administered Date(s) Administered   Fluad Quad(high Dose 65+) 09/10/2021   Influenza,inj,Quad PF,6+ Mos 09/16/2016, 11/18/2017, 09/07/2019   Influenza-Unspecified 10/21/2022   Moderna Sars-Covid-2 Vaccination 02/18/2020, 03/22/2020, 03/14/2021   Pneumococcal Conjugate-13 09/16/2016   Respiratory Syncytial Virus Vaccine,Recomb Aduvanted(Arexvy) 08/26/2022   Td 12/18/2006, 11/19/2018   Td  (Adult) 12/18/2006   Unspecified SARS-COV-2 Vaccination 08/13/2021, 05/18/2022, 08/26/2022   Zoster Recombinant(Shingrix) 05/25/2018, 10/10/2018   Zoster, Live 05/27/2018, 10/11/2018    TDAP status: Up to date  Flu Vaccine status: Due, Education has been provided regarding the importance of this vaccine. Advised may receive this vaccine at local pharmacy or Health Dept. Aware to provide a copy of the vaccination record if obtained from local pharmacy or Health Dept. Verbalized acceptance and understanding.  Pneumococcal vaccine status: Due, Education has been provided regarding the importance of this vaccine. Advised may receive this vaccine at local pharmacy or Health Dept. Aware to provide a copy of the vaccination record if obtained from local pharmacy or Health Dept. Verbalized acceptance and understanding.  Covid-19 vaccine status: Completed vaccines  Qualifies for Shingles Vaccine? Yes   Zostavax completed Yes   Shingrix Completed?: Yes  Screening Tests Health Maintenance  Topic Date Due   Pneumonia Vaccine 3+ Years old (2 of 2 - PPSV23 or PCV20) 11/27/2023 (Originally 11/13/2020)   INFLUENZA VACCINE  03/01/2024 (Originally 07/03/2023)   COVID-19 Vaccine (7 - 2023-24 season) 08/17/2024 (Originally 08/03/2023)   MAMMOGRAM  12/12/2023   Medicare Annual Wellness (AWV)  11/10/2024   DEXA SCAN  08/28/2025   Colonoscopy  06/30/2028   DTaP/Tdap/Td (4 - Tdap) 11/19/2028   Hepatitis C Screening  Completed   Zoster Vaccines- Shingrix  Completed   HPV VACCINES  Aged Out    Health Maintenance  There are no preventive care reminders to display for this patient.   Colorectal cancer screening: Type of screening: Cologuard. Completed 07/26/2023. Repeat every 3 years  Mammogram status: Ordered 08/11/2023. Pt provided with contact info and advised to call to schedule appt.   Bone Density status: Completed 04/01/2018.   Lung Cancer Screening: (Low Dose CT Chest recommended if Age 65-80 years,  20 pack-year currently smoking OR have quit w/in 15years.) does not qualify.   Lung Cancer Screening Referral: no  Additional Screening:  Hepatitis C Screening: does qualify; Completed 05/17/2022  Vision Screening: Recommended annual ophthalmology exams for early detection of glaucoma and other disorders of the eye. Is the patient up to date with their annual eye exam?  Yes  Who is the provider or what is the name of the office in which the patient attends annual eye exams? [facility] If pt is not established with a provider, would they like to be referred to a provider to establish care? No .   Dental Screening: Recommended annual dental exams for proper oral hygiene  Diabetic Foot Exam: n/a  Community Resource Referral / Chronic Care Management: CRR required this visit?  No   CCM required this visit?  No     Plan:     I have personally reviewed and noted the following in the patient's chart:   Medical and social history Use of alcohol, tobacco or illicit drugs  Current medications and supplements including opioid prescriptions. Patient is not currently taking opioid prescriptions. Functional ability and status Nutritional status Physical activity Advanced directives List of other physicians Hospitalizations, surgeries, and ER visits in previous 12 months Vitals Screenings to include cognitive, depression, and falls Referrals and appointments  In addition, I have reviewed and discussed with patient certain preventive protocols, quality metrics, and best practice recommendations. A written personalized care plan for preventive services as well as general preventive health recommendations were provided to patient.     Barb Merino, LPN   32/44/0102   After Visit Summary: (MyChart) Due to this being a telephonic visit, the after visit summary with patients personalized plan was offered to patient via MyChart   Nurse Notes: none

## 2023-11-11 NOTE — Patient Instructions (Signed)
Savannah [name] , Thank you for taking time to come for your Medicare Wellness Visit. I appreciate your ongoing commitment to your health goals. Please review the following plan we discussed and let me know if I can assist you in the future.   Referrals/Orders/Follow-Ups/Clinician Recommendations: none  This is a list of the screening recommended for you and due dates:  Health Maintenance  Topic Date Due   Pneumonia Vaccine (2 of 2 - PPSV23 or PCV20) 11/27/2023*   Flu Shot  03/01/2024*   COVID-19 Vaccine (7 - 2023-24 season) 08/17/2024*   Mammogram  12/12/2023   Medicare Annual Wellness Visit  11/10/2024   DEXA scan (bone density measurement)  08/28/2025   Colon Cancer Screening  06/30/2028   DTaP/Tdap/Td vaccine (4 - Tdap) 11/19/2028   Hepatitis C Screening  Completed   Zoster (Shingles) Vaccine  Completed   HPV Vaccine  Aged Out  *Topic was postponed. The date shown is not the original due date.    Advanced directives: (Copy Requested) Please bring a copy of your health care power of attorney and living will to the office to be added to your chart at your convenience.  Next Medicare Annual Wellness Visit scheduled for next year: Yes  Insert Preventive Care attachment Insert FALL PREVENTION attachment if needed

## 2023-11-18 ENCOUNTER — Other Ambulatory Visit: Payer: Self-pay | Admitting: Nurse Practitioner

## 2023-11-18 DIAGNOSIS — D0512 Intraductal carcinoma in situ of left breast: Secondary | ICD-10-CM

## 2023-11-18 DIAGNOSIS — F419 Anxiety disorder, unspecified: Secondary | ICD-10-CM

## 2023-11-19 NOTE — Telephone Encounter (Signed)
 Last apt 08/18/23

## 2023-12-25 ENCOUNTER — Ambulatory Visit
Admission: RE | Admit: 2023-12-25 | Discharge: 2023-12-25 | Disposition: A | Discharge status: Home / Self Care | Payer: Medicare Other | Source: Ambulatory Visit | Attending: Adult Health | Admitting: Adult Health

## 2023-12-25 DIAGNOSIS — Z1231 Encounter for screening mammogram for malignant neoplasm of breast: Secondary | ICD-10-CM

## 2023-12-29 ENCOUNTER — Encounter: Payer: Self-pay | Admitting: Nurse Practitioner

## 2023-12-30 ENCOUNTER — Ambulatory Visit: Payer: Medicare Other | Admitting: Nurse Practitioner

## 2023-12-30 ENCOUNTER — Encounter: Payer: Self-pay | Admitting: Nurse Practitioner

## 2023-12-30 VITALS — BP 126/80 | HR 83 | Wt 174.2 lb

## 2023-12-30 DIAGNOSIS — M797 Fibromyalgia: Secondary | ICD-10-CM

## 2023-12-30 DIAGNOSIS — M5481 Occipital neuralgia: Secondary | ICD-10-CM | POA: Diagnosis not present

## 2023-12-30 DIAGNOSIS — R42 Dizziness and giddiness: Secondary | ICD-10-CM

## 2023-12-30 DIAGNOSIS — Z23 Encounter for immunization: Secondary | ICD-10-CM

## 2023-12-30 DIAGNOSIS — S161XXA Strain of muscle, fascia and tendon at neck level, initial encounter: Secondary | ICD-10-CM | POA: Diagnosis not present

## 2023-12-30 MED ORDER — CYCLOBENZAPRINE HCL 5 MG PO TABS
5.0000 mg | ORAL_TABLET | Freq: Every evening | ORAL | 2 refills | Status: DC | PRN
Start: 2023-12-30 — End: 2024-10-27

## 2023-12-30 NOTE — Assessment & Plan Note (Signed)
Mild vertigo associated with neck pain flare-ups. Symptoms include dizziness when bending over or lying down, which resolve quickly. No new sensitivity to light, nausea, or vomiting. - Monitor symptoms and manage underlying neck pain

## 2023-12-30 NOTE — Assessment & Plan Note (Signed)
Fibromyalgia may exacerbate pain and muscle spasms. Pain feels more intense compared to others. Dry needling has been beneficial for other patients with fibromyalgia and may provide significant relief. - Recommend physical therapy with dry needling

## 2023-12-30 NOTE — Patient Instructions (Addendum)
I have sent in the flexeril for you to take at bedtime for at least the next 5 nights to help breath the cycle of spasms. You can also take your tylenol to help with the pain.   I sent the referral for you, they will contact you to get this set up or you can call the office and let them know that a referral has been placed.
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
## 2024-01-13 ENCOUNTER — Other Ambulatory Visit: Payer: Self-pay | Admitting: Nurse Practitioner

## 2024-01-16 ENCOUNTER — Telehealth (INDEPENDENT_AMBULATORY_CARE_PROVIDER_SITE_OTHER): Payer: Medicare Other | Admitting: Medical

## 2024-01-16 VITALS — BP 123/70 | HR 79 | Wt 171.0 lb

## 2024-01-16 DIAGNOSIS — R059 Cough, unspecified: Secondary | ICD-10-CM

## 2024-01-16 DIAGNOSIS — J40 Bronchitis, not specified as acute or chronic: Secondary | ICD-10-CM | POA: Diagnosis not present

## 2024-01-16 MED ORDER — BENZONATATE 100 MG PO CAPS: 100.0000 mg | ORAL_CAPSULE | Freq: Three times a day (TID) | ORAL | 0 refills | Status: DC | PRN

## 2024-01-16 MED ORDER — ALBUTEROL SULFATE HFA 108 (90 BASE) MCG/ACT IN AERS: 2.0000 | INHALATION_SPRAY | Freq: Four times a day (QID) | RESPIRATORY_TRACT | 0 refills | Status: DC | PRN

## 2024-01-16 NOTE — Progress Notes (Signed)
Subjective:     Patient ID: Savannah [name], female   DOB: [birth year], 68 y.o.   MRN: [id number]  This visit type was conducted due to national recommendations for restrictions regarding the COVID-19 Pandemic (e.g. social distancing) in an effort to limit this patient's exposure and mitigate transmission in our community.  Due to their co-morbid illnesses, this patient is at least at moderate risk for complications without adequate follow up.  This format is felt to be most appropriate for this patient at this time.    Documentation for virtual audio and video telecommunications through [place] encounter:  The patient was located at home. The provider was located in the office. The patient did consent to this visit and is aware of possible charges through their insurance for this visit.  The other persons participating in this telemedicine service were none. Time spent on call was 20 minutes and in review of previous records 20 minutes total.  This virtual service is not related to other E/M service within previous 7 days.   HPI Chief Complaint  Patient presents with   cough    Cough x since last Wednesday, covid negative x 3. Symptoms- scratchy throat, post nasal drip. No fever chills or body aches. Will do a puff if Afrin at night and the 10ml Deslym OTC, Mucinex tablets every 12 hours. Rash on face- noticed it yesterday   Here for illness. Started about 11 days ago with coughing.  By end of first week did home covid test and it was negative.   Symptoms persisted but in last few days worse cough, getting some colored thick mucous, has had scratchy throat, nasal drip.  No fever, body aches, chills.  Has had some nausea.  Has some post nasal drainage.    No SOB or wheezing, no ear pain, no sinus pain or pressure.    No sick contacts.  Nonsmoker.   No hx/o asthma or lung disease.   Has been isolating over a week.  Using mucinex, Afrin, Delsym.  Still uses zyrtec night, flonase in  morning.  No other aggravating or relieving factors. No other complaint.  Past Medical History:  Diagnosis Date   Adhesive capsulitis    Anxiety    BPPV (benign paroxysmal positional vertigo)    Breast cancer (HCC) 2008   Endometriosis    Fibromyalgia    GERD (gastroesophageal reflux disease)    Hyperlipidemia    Lymphedema    Malaria 1996   Personal history of radiation therapy 2008   PONV (postoperative nausea and vomiting)    Shingles 2002   Suprapubic pressure 08/18/2023   Current Outpatient Medications on File Prior to Visit  Medication Sig Dispense Refill   atorvastatin (LIPITOR) 10 MG tablet TAKE 1 TABLET BY MOUTH EVERY DAY 90 tablet 1   cetirizine (ZYRTEC) 10 MG tablet Take 10 mg by mouth daily.     Cholecalciferol (VITAMIN D3) 50 MCG (2000 UT) TABS Take 1 tablet by mouth daily.     fluticasone (FLONASE) 50 MCG/ACT nasal spray Place 2 sprays into both nostrils daily.     hydrOXYzine (ATARAX) 25 MG tablet Take 1 tablet (25 mg total) by mouth at bedtime as needed and may repeat dose one time if needed for anxiety (sleep). 180 tablet 3   traMADol (ULTRAM) 50 MG tablet TAKE 1 TABLET BY MOUTH EVERY 8 HOURS AS NEEDED. 45 tablet 2   ALPRAZolam (XANAX) 0.25 MG tablet TAKE 1-2 TABLETS (0.25-0.5 MG TOTAL) BY MOUTH 3 (THREE) TIMES DAILY  AS NEEDED FOR ANXIETY. (Patient not taking: Reported on 01/16/2024) 20 tablet 2   cyclobenzaprine (FLEXERIL) 5 MG tablet Take 1 tablet (5 mg total) by mouth at bedtime as needed for muscle spasms. (Patient not taking: Reported on 01/16/2024) 30 tablet 2   No current facility-administered medications on file prior to visit.    Review of Systems As in subjective    Objective:   Physical Exam Due to coronavirus pandemic stay at home measures, patient visit was virtual and they were not examined in person.   BP 123/70   Pulse 79   Wt 171 lb (77.6 kg)   BMI 27.60 kg/m   Gen: wd, wn, nad Coughing a bit, but no wheezing        Assessment:      Encounter Diagnoses  Name Primary?   Bronchitis Yes   Cough, unspecified type        Plan:     Encounter Diagnoses  Name Primary?   Bronchitis Yes   Cough, unspecified type    We discussed symptoms and concerns.  Sounds like she has either bronchitis or lingering cold symptoms.  She did not want to use any type of antibiotics.  Counseled against flu or COVID testing as it would not make any difference at this point on how we proceed and likely she would have negative testing anyhow given that it has been almost 2 weeks since symptoms started.  She has not had flu symptoms at all.  Advise she continue good hydration, rest, consider doing some warm humidified air at home in the room she is hanging out in.  Begin medications below to help with symptoms.  If not much improved within the next 4 to 5 days then call back  Advise she limit Afrin use to 3 days or less  Savannah "Raynelle Fanning" was seen today for cough.  Diagnoses and all orders for this visit:  Bronchitis  Cough, unspecified type  Other orders -     albuterol (VENTOLIN HFA) 108 (90 Base) MCG/ACT inhaler; Inhale 2 puffs into the lungs every 6 (six) hours as needed for wheezing or shortness of breath. -     benzonatate (TESSALON) 100 MG capsule; Take 1 capsule (100 mg total) by mouth 3 (three) times daily as needed for cough.    F/u prn

## 2024-02-04 DIAGNOSIS — T7840XA Allergy, unspecified, initial encounter: Secondary | ICD-10-CM | POA: Insufficient documentation

## 2024-03-05 ENCOUNTER — Other Ambulatory Visit: Payer: Self-pay

## 2024-03-05 ENCOUNTER — Encounter: Payer: Self-pay | Admitting: Nurse Practitioner

## 2024-03-05 DIAGNOSIS — F5104 Psychophysiologic insomnia: Secondary | ICD-10-CM

## 2024-03-05 DIAGNOSIS — F419 Anxiety disorder, unspecified: Secondary | ICD-10-CM

## 2024-03-05 MED ORDER — HYDROXYZINE HCL 25 MG PO TABS: 25.0000 mg | ORAL_TABLET | Freq: Every evening | ORAL | 1 refills | Status: AC | PRN

## 2024-03-15 ENCOUNTER — Telehealth: Payer: Self-pay | Admitting: Internal Medicine

## 2024-03-15 NOTE — Telephone Encounter (Signed)
 Inbound call from patient, states her bowel movements have not been normal for the past couple of months, She states this morning she experienced "holes" in her bowel and is anxious it may be something serious. Patient states she would like to speak to a nurse before scheduling an appointment.

## 2024-03-15 NOTE — Telephone Encounter (Signed)
 Left message for patient to call.

## 2024-03-16 NOTE — Telephone Encounter (Signed)
 Spoke to patient who says over the last 2.5 months, her bowels have changed but says yesterday, she had a large volume soft bowel movement that appeared to have "holes" in it. States she looked this up on the internet and it suggested she could have a perforation, so it concerned her. Patient indicates that she has some abdominal discomfort/sensitivity at times, but denies any severe abdominal pain. No fever. No nausea or vomiting. She has not noticed any brb in the stool but did notice some dark specks at one time. No overt black tarry stools. Patient has a history of breast cancer and started taking a new medication a couple months back which she correlates with some of the bowel changes. States she has since stopped that medication.   Patient has been scheduled for an appointment with Dr Bridgett Camps for 05/13/24 (she was offered appt with APP for 03/29/24 but prefers to wait for Dr Bridgett Camps) and she says she has also scheduled an appointment with her PCP. Patient is advised that should she develop severe abdominal pain, fever, intractable nausea/vomiting, abdominal distention, melena/hematochzia, she should seek immediate medical attention. She verbalizes understanding.

## 2024-03-17 NOTE — Progress Notes (Unsigned)
 No chief complaint on file.  Over the last 2.5 months, her bowels have changed. 4/13 she had a large volume soft bowel movement that appeared to have "holes" in it. Stated to nurse at GI office that she looked this up on the internet and it suggested she could have a perforation, so it concerned her. Patient denies any severe abdominal pain, nausea or vomiting. She has some discomfort/sensitivity occasionally.  No hematochezia or melena.  Changes to meds??    Patient has been scheduled for an appointment with Dr Bridgett Camps for 05/13/24 (she was offered appt with APP for 03/29/24 but prefers to wait for Dr Bridgett Camps).    PMH, PSH, SH reviewed DCIS L breast, fibromyalgia, IBS, OA knee, hand, anxiety, HLD  ROS:    PHYSICAL EXAM:  There were no vitals taken for this visit.  Wt Readings from Last 3 Encounters:  01/16/24 171 lb (77.6 kg)  12/30/23 174 lb 3.2 oz (79 kg)  08/18/23 167 lb 9.6 oz (76 kg)        ASSESSMENT/PLAN:

## 2024-03-18 ENCOUNTER — Ambulatory Visit (INDEPENDENT_AMBULATORY_CARE_PROVIDER_SITE_OTHER): Admitting: Family Medicine

## 2024-03-18 VITALS — BP 130/80 | HR 79 | Temp 98.3°F | Wt 173.4 lb

## 2024-03-18 DIAGNOSIS — J302 Other seasonal allergic rhinitis: Secondary | ICD-10-CM

## 2024-03-18 DIAGNOSIS — R198 Other specified symptoms and signs involving the digestive system and abdomen: Secondary | ICD-10-CM | POA: Diagnosis not present

## 2024-03-18 DIAGNOSIS — R600 Localized edema: Secondary | ICD-10-CM | POA: Diagnosis not present

## 2024-05-13 ENCOUNTER — Ambulatory Visit: Admitting: Internal Medicine

## 2024-07-03 ENCOUNTER — Other Ambulatory Visit: Payer: Self-pay | Admitting: Nurse Practitioner

## 2024-07-21 ENCOUNTER — Ambulatory Visit (HOSPITAL_COMMUNITY): Payer: Self-pay

## 2024-07-21 ENCOUNTER — Ambulatory Visit: Admitting: Medical

## 2024-07-21 NOTE — Telephone Encounter (Signed)
 Copied from CRM #[id number]. Topic: Appointments - Appointment Scheduling >> Jul 21, 2024  8:15 AM Myrick T wrote: Patient/patient representative is calling to schedule an appointment. Refer to attachments for appointment information. Patient needs an appt for a med refill but providers first available appt is 10/27 and she will run out of meds wihin 45 days. Please f/u with patient for appt  Left message for pt to call me back to schedule

## 2024-08-10 ENCOUNTER — Other Ambulatory Visit: Payer: Self-pay | Admitting: Nurse Practitioner

## 2024-08-10 DIAGNOSIS — D0512 Intraductal carcinoma in situ of left breast: Secondary | ICD-10-CM

## 2024-08-10 NOTE — Telephone Encounter (Unsigned)
 Copied from CRM [id number]. Topic: Clinical - Medication Refill >> Aug 10, 2024  2:31 PM Fredrica W wrote: Medication: traMADol  (ULTRAM ) 50 MG tablet  Has the patient contacted their pharmacy? Yes (Agent: If no, request that the patient contact the pharmacy for the refill. If patient does not wish to contact the pharmacy document the reason why and proceed with request.) (Agent: If yes, when and what did the pharmacy advise?) unable to fill   This is the patient's preferred pharmacy:  [id number] - [place], [place] - [address] AT [facility] [address] [place] [address] Phone: [phone] Fax: [phone]   Is this the correct pharmacy for this prescription? Yes If no, delete pharmacy and type the correct one.   Has the prescription been filled recently? No  Is the patient out of the medication? Yes  Has the patient been seen for an appointment in the last year OR does the patient have an upcoming appointment? Yes  Can we respond through MyChart? No  Agent: Please be advised that Rx refills may take up to 3 business days. We ask that you follow-up with your pharmacy.

## 2024-08-11 DIAGNOSIS — M797 Fibromyalgia: Secondary | ICD-10-CM | POA: Insufficient documentation

## 2024-08-11 DIAGNOSIS — R202 Paresthesia of skin: Secondary | ICD-10-CM | POA: Insufficient documentation

## 2024-08-11 DIAGNOSIS — D1800 Hemangioma unspecified site: Secondary | ICD-10-CM | POA: Insufficient documentation

## 2024-08-11 DIAGNOSIS — D225 Melanocytic nevi of trunk: Secondary | ICD-10-CM | POA: Insufficient documentation

## 2024-08-11 MED ORDER — TRAMADOL HCL 50 MG PO TABS
50.0000 mg | ORAL_TABLET | Freq: Three times a day (TID) | ORAL | 2 refills | Status: AC | PRN
Start: 2024-08-11 — End: ?

## 2024-09-14 NOTE — Progress Notes (Unsigned)
 No chief complaint on file.    At her visit with me in April (related to bowel changes) she had noted some L>R foot and ankle swelling intermittently since she took aromatase inhibitor.  Swelling wasn't frequent, mainly when on her feet a lot. Resolves on its own. Never had pain.       PMH, PSH, SH reviewed DCIS L breast, fibromyalgia, IBS, OA knee, hand, anxiety, HLD  ROS:    PHYSICAL EXAM:  There were no vitals taken for this visit.  Wt Readings from Last 3 Encounters:  03/18/24 173 lb 6.4 oz (78.7 kg)  01/16/24 171 lb (77.6 kg)  12/30/23 174 lb 3.2 oz (79 kg)       ASSESSMENT/PLAN:

## 2024-09-15 ENCOUNTER — Ambulatory Visit (INDEPENDENT_AMBULATORY_CARE_PROVIDER_SITE_OTHER): Admitting: Family Medicine

## 2024-09-15 VITALS — BP 120/76 | HR 76 | Ht 66.0 in | Wt 174.2 lb

## 2024-09-15 DIAGNOSIS — Z23 Encounter for immunization: Secondary | ICD-10-CM

## 2024-09-15 DIAGNOSIS — R600 Localized edema: Secondary | ICD-10-CM

## 2024-09-15 NOTE — Patient Instructions (Addendum)
 We discussed the potential causes for swelling your feet/legs. Luckily, there is no evidence of any underlying significant cause--nothing to suggest congestive heart failure, DVT (blood clot), pelvic mass (pelvic exam not performed, but we discussed the difference in findings). We discussed the possibility of kidney issues causing swelling.  Your last check in 08/2023 was all normal for this. I encourage you to set up a routine visit with your PCP (you are due for labs).  I suspect there is likely a component of venous insufficiency that contributes to the swelling. Continue to limit your sodium intake, wear compression socks, get regular exercise (including frequent breaks when sitting--calf raises or walking), and elevate leg as needed for swelling. If you have persistent or worsening swelling, especially if it doesn't improve with elevation, if you have pain, redness, shortness of breath, or other symptoms along with the worsening swelling, please present for re-evaluation.

## 2024-09-28 ENCOUNTER — Encounter: Payer: Self-pay | Admitting: Nurse Practitioner

## 2024-09-28 ENCOUNTER — Ambulatory Visit: Payer: Self-pay | Admitting: Nurse Practitioner

## 2024-09-28 ENCOUNTER — Ambulatory Visit: Admitting: Nurse Practitioner

## 2024-09-28 VITALS — BP 124/82 | HR 67 | Wt 172.8 lb

## 2024-09-28 DIAGNOSIS — T7840XA Allergy, unspecified, initial encounter: Secondary | ICD-10-CM

## 2024-09-28 DIAGNOSIS — R799 Abnormal finding of blood chemistry, unspecified: Secondary | ICD-10-CM | POA: Diagnosis not present

## 2024-09-28 DIAGNOSIS — E782 Mixed hyperlipidemia: Secondary | ICD-10-CM | POA: Diagnosis not present

## 2024-09-28 DIAGNOSIS — R3989 Other symptoms and signs involving the genitourinary system: Secondary | ICD-10-CM | POA: Diagnosis not present

## 2024-09-28 DIAGNOSIS — E559 Vitamin D deficiency, unspecified: Secondary | ICD-10-CM

## 2024-09-28 DIAGNOSIS — R600 Localized edema: Secondary | ICD-10-CM

## 2024-09-28 LAB — POCT URINALYSIS DIPSTICK
Bilirubin, UA: NEGATIVE
Blood, UA: NEGATIVE
Glucose, UA: NEGATIVE
Ketones, UA: NEGATIVE
Leukocytes, UA: NEGATIVE
Nitrite, UA: NEGATIVE
Protein, UA: NEGATIVE
Spec Grav, UA: 1.01 (ref 1.010–1.025)
Urobilinogen, UA: 0.2 U/dL
pH, UA: 6 (ref 5.0–8.0)

## 2024-09-28 LAB — LIPID PANEL

## 2024-09-28 NOTE — Assessment & Plan Note (Signed)
 Increased bladder awareness and urgency, sometimes without full bladder. Very mild tenderness. At this time etiology is not clear. Labs are pending. Increased frequency of nocturnal urination. - Perform urinalysis to check for abnormalities

## 2024-09-28 NOTE — Assessment & Plan Note (Signed)
 Controlled with azelastine (AstroPro) twice daily.

## 2024-09-28 NOTE — Assessment & Plan Note (Signed)
 Intermittent swelling in feet, primarily right foot, with recent exacerbation. No discoloration to the skin or texture changes noted. No swelling or varicosities in the major veins. Possible correlation with weather changes per patient. Differential includes kidney dysfunction, liver dysfunction, thyroid  abnormality, or heart failure, though heart and lungs sound normal. Swelling does not completely resolve overnight. - Order labs to evaluate liver, kidneys, thyroid , electrolyte, and other concerns. - Order BNP to rule out heart failure - Continue to wear compression stockings

## 2024-09-28 NOTE — Progress Notes (Signed)
 [name], DNP, AGNP-c [facility] [address] [place], [place] [address] [phone]   ACUTE VISIT on 09/28/2024  Blood pressure 124/82, pulse 67, weight 172 lb 12.8 oz (78.4 kg).  Subjective:  HPI  History of Present Illness Savannah [name] is a 68 year old female who presents with foot swelling and for routine blood work.  She has been experiencing swelling in her feet for about a month, primarily attributed to weather changes. The swelling is more pronounced in the right foot, causing tightness and pain in the skin. Despite elevating her feet at night, the swelling does not completely resolve by morning on some days. She has a history of sleeping with her feet elevated due to swelling, which typically resolved overnight, but recently this has not been the case. She has a history of swelling in both ankles, but notes that the recent swelling in her feet is new. No shortness of breath, dizziness, or significant cough, attributing a minor cough to allergies. No changes in her hands during the swelling episodes.  She has a history of bilateral knee replacements and has not taken ibuprofen due to stomach issues.   She has not needed hydroxyzine  for sleep in weeks, attributing improved sleep to a new mattress and changes in her evening routine.   She continues to take atorvastatin  nightly and uses azelastine twice daily for allergies. She occasionally uses tramadol  for pain related to her work, as she cannot take ibuprofen.  She reports experiencing bladder urgency and increased awareness of her bladder, which has been occurring for months. She now gets up at least once a night to urinate, which is a change from her previous pattern. She describes episodes of urgency that are not necessarily related to a full bladder, sometimes resulting in only a small amount of urination.   She has a history of IBS, which has improved since going gluten-free, leading to a  more regular bowel schedule.   ROS negative except for what is listed in HPI. History, Medications, Surgery, SDOH, and Family History reviewed and updated as appropriate.  Objective:  Physical Exam Vitals and nursing note reviewed.  Constitutional:      Appearance: Normal appearance.  HENT:     Head: Normocephalic.  Eyes:     Pupils: Pupils are equal, round, and reactive to light.  Neck:     Vascular: No carotid bruit.  Cardiovascular:     Rate and Rhythm: Normal rate and regular rhythm.     Pulses: Normal pulses.     Heart sounds: Normal heart sounds.  Pulmonary:     Effort: Pulmonary effort is normal.     Breath sounds: Normal breath sounds.  Abdominal:     General: Bowel sounds are normal. There is no distension.     Palpations: Abdomen is soft.     Tenderness: There is abdominal tenderness. There is no right CVA tenderness, left CVA tenderness or guarding.      Comments: Mild suprapubic tenderness noted with palpation.   Musculoskeletal:        General: Normal range of motion.     Cervical back: Normal range of motion.     Right lower leg: Edema present.     Left lower leg: Edema present.     Comments: +1 pitting edema noted on the shins bilaterally. Mild edema noted all the way to the toes. No discoloration or changes in skin texture.   Lymphadenopathy:     Cervical: No cervical adenopathy.  Skin:    General: Skin is warm.  Neurological:     General: No focal deficit present.     Mental Status: She is alert and oriented to person, place, and time.     Sensory: No sensory deficit.     Motor: No weakness.     Coordination: Coordination normal.  Psychiatric:        Mood and Affect: Mood normal.         Assessment & Plan:   Problem List Items Addressed This Visit     Hyperlipidemia - Primary   Well-controlled on current atorvastatin  dose. Awaiting lab results to assess current cholesterol levels. Prefers to wait for lab results before refilling  atorvastatin . - Await lab results before refilling atorvastatin       Relevant Orders   Lipid panel   Allergy   Controlled with azelastine (AstroPro) twice daily.      Bilateral leg edema   Intermittent swelling in feet, primarily right foot, with recent exacerbation. No discoloration to the skin or texture changes noted. No swelling or varicosities in the major veins. Possible correlation with weather changes per patient. Differential includes kidney dysfunction, liver dysfunction, thyroid  abnormality, or heart failure, though heart and lungs sound normal. Swelling does not completely resolve overnight. - Order labs to evaluate liver, kidneys, thyroid , electrolyte, and other concerns. - Order BNP to rule out heart failure - Continue to wear compression stockings      Relevant Orders   CBC with Differential/Platelet   CMP14+EGFR   Brain natriuretic peptide   TSH + free T4   Hemoglobin A1c   Sensation of pressure in bladder area   Increased bladder awareness and urgency, sometimes without full bladder. Very mild tenderness. At this time etiology is not clear. Labs are pending. Increased frequency of nocturnal urination. - Perform urinalysis to check for abnormalities      Relevant Orders   POCT urinalysis dipstick (Completed)   Vitamin D  deficiency   Relevant Orders   Vitamin D , 25-hydroxy   Other Visit Diagnoses       Abnormal finding of blood chemistry, unspecified       Relevant Orders   Hemoglobin A1c       [name], DNP, AGNP-c Time: 33 minutes, >50% spent counseling, care coordination, chart review, and documentation.

## 2024-09-28 NOTE — Assessment & Plan Note (Signed)
 Well-controlled on current atorvastatin  dose. Awaiting lab results to assess current cholesterol levels. Prefers to wait for lab results before refilling atorvastatin . - Await lab results before refilling atorvastatin 

## 2024-09-29 LAB — CMP14+EGFR
ALT: 14 IU/L (ref 0–32)
AST: 17 IU/L (ref 0–40)
Albumin: 4.5 g/dL (ref 3.9–4.9)
Alkaline Phosphatase: 72 IU/L (ref 49–135)
BUN/Creatinine Ratio: 17 (ref 12–28)
BUN: 12 mg/dL (ref 8–27)
Bilirubin Total: 0.6 mg/dL (ref 0.0–1.2)
CO2: 23 mmol/L (ref 20–29)
Calcium: 9.6 mg/dL (ref 8.7–10.3)
Chloride: 102 mmol/L (ref 96–106)
Creatinine, Ser: 0.72 mg/dL (ref 0.57–1.00)
Globulin, Total: 2 g/dL (ref 1.5–4.5)
Glucose: 93 mg/dL (ref 70–99)
Potassium: 4.2 mmol/L (ref 3.5–5.2)
Sodium: 140 mmol/L (ref 134–144)
Total Protein: 6.5 g/dL (ref 6.0–8.5)
eGFR: 91 mL/min/1.73 (ref >59)

## 2024-09-29 LAB — CBC WITH DIFFERENTIAL/PLATELET
Basophils Absolute: 0.1 x10E3/uL (ref 0.0–0.2)
Basos: 1 %
EOS (ABSOLUTE): 0.1 x10E3/uL (ref 0.0–0.4)
Eos: 2 %
Hematocrit: 39.8 % (ref 34.0–46.6)
Hemoglobin: 12.9 g/dL (ref 11.1–15.9)
Immature Grans (Abs): 0 x10E3/uL (ref 0.0–0.1)
Immature Granulocytes: 0 %
Lymphocytes Absolute: 1.5 x10E3/uL (ref 0.7–3.1)
Lymphs: 28 %
MCH: 29.1 pg (ref 26.6–33.0)
MCHC: 32.4 g/dL (ref 31.5–35.7)
MCV: 90 fL (ref 79–97)
Monocytes Absolute: 0.4 x10E3/uL (ref 0.1–0.9)
Monocytes: 7 %
Neutrophils Absolute: 3.2 x10E3/uL (ref 1.4–7.0)
Neutrophils: 62 %
Platelets: 290 x10E3/uL (ref 150–450)
RBC: 4.44 x10E6/uL (ref 3.77–5.28)
RDW: 12.8 % (ref 11.7–15.4)
WBC: 5.2 x10E3/uL (ref 3.4–10.8)

## 2024-09-29 LAB — LIPID PANEL
Cholesterol, Total: 227 mg/dL — AB (ref 100–199)
HDL: 99 mg/dL (ref >39)
LDL CALC COMMENT:: 2.3 ratio (ref 0.0–4.4)
LDL Chol Calc (NIH): 117 mg/dL — AB (ref 0–99)
Triglycerides: 64 mg/dL (ref 0–149)
VLDL Cholesterol Cal: 11 mg/dL (ref 5–40)

## 2024-09-29 LAB — BRAIN NATRIURETIC PEPTIDE: BNP: 53.2 pg/mL (ref 0.0–100.0)

## 2024-09-29 LAB — HEMOGLOBIN A1C
Est. average glucose Bld gHb Est-mCnc: 117 mg/dL
Hgb A1c MFr Bld: 5.7 % — AB (ref 4.8–5.6)

## 2024-09-29 LAB — TSH+FREE T4
Free T4: 1.08 ng/dL (ref 0.82–1.77)
TSH: 0.839 u[IU]/mL (ref 0.450–4.500)

## 2024-09-29 LAB — VITAMIN D 25 HYDROXY (VIT D DEFICIENCY, FRACTURES): Vit D, 25-Hydroxy: 33.1 ng/mL (ref 30.0–100.0)

## 2024-10-05 ENCOUNTER — Other Ambulatory Visit: Payer: Self-pay | Admitting: Nurse Practitioner

## 2024-10-05 DIAGNOSIS — R7303 Prediabetes: Secondary | ICD-10-CM | POA: Insufficient documentation

## 2024-10-05 DIAGNOSIS — E7841 Elevated Lipoprotein(a): Secondary | ICD-10-CM

## 2024-10-05 MED ORDER — ATORVASTATIN CALCIUM 10 MG PO TABS: 10.0000 mg | ORAL_TABLET | Freq: Every day | ORAL | 3 refills | Status: AC

## 2024-10-23 ENCOUNTER — Encounter: Payer: Self-pay | Admitting: Nurse Practitioner

## 2024-10-23 DIAGNOSIS — S161XXA Strain of muscle, fascia and tendon at neck level, initial encounter: Secondary | ICD-10-CM

## 2024-10-23 DIAGNOSIS — F419 Anxiety disorder, unspecified: Secondary | ICD-10-CM

## 2024-10-27 ENCOUNTER — Other Ambulatory Visit (HOSPITAL_COMMUNITY): Payer: Self-pay

## 2024-10-27 MED ORDER — ALPRAZOLAM 0.25 MG PO TABS
0.2500 mg | ORAL_TABLET | Freq: Three times a day (TID) | ORAL | 2 refills | Status: AC | PRN
  Filled 2024-10-27 – 2024-11-02 (×2): qty 20, 4d supply, fill #0

## 2024-10-27 MED ORDER — CYCLOBENZAPRINE HCL 5 MG PO TABS
5.0000 mg | ORAL_TABLET | Freq: Every evening | ORAL | 2 refills | Status: AC | PRN
  Filled 2024-10-27: qty 30, 30d supply, fill #0

## 2024-10-29 ENCOUNTER — Other Ambulatory Visit (HOSPITAL_COMMUNITY): Payer: Self-pay

## 2024-11-02 ENCOUNTER — Other Ambulatory Visit (HOSPITAL_COMMUNITY): Payer: Self-pay

## 2024-11-04 ENCOUNTER — Other Ambulatory Visit (HOSPITAL_COMMUNITY): Payer: Self-pay

## 2024-11-16 ENCOUNTER — Ambulatory Visit (INDEPENDENT_AMBULATORY_CARE_PROVIDER_SITE_OTHER): Payer: Medicare Other

## 2024-11-16 VITALS — Ht 66.0 in | Wt 171.0 lb

## 2024-11-16 DIAGNOSIS — Z0000 Encounter for general adult medical examination without abnormal findings: Secondary | ICD-10-CM

## 2024-11-16 NOTE — Progress Notes (Cosign Needed)
 Chief Complaint  Patient presents with   Medicare Wellness     Subjective:   Savannah [name] is a 68 y.o. female who presents for a Medicare Annual Wellness Visit.  Visit info / Clinical Intake: Medicare Wellness Visit Type:: Subsequent Annual Wellness Visit Persons participating in visit and providing information:: patient Medicare Wellness Visit Mode:: Telephone If telephone:: video error Since this visit was completed virtually, some vitals may be partially provided or unavailable. Missing vitals are due to the limitations of the virtual format.: Documented vitals are patient reported If Telephone or Video please confirm:: I connected with patient using audio/video enable telemedicine. I verified patient identity with two identifiers, discussed telehealth limitations, and patient agreed to proceed. Patient Location:: home Provider Location:: office Interpreter Needed?: No Pre-visit prep was completed: yes AWV questionnaire completed by patient prior to visit?: yes Date:: 11/16/24 Living arrangements:: (!) (Patient-Rptd) lives alone Patient's Overall Health Status Rating: (Patient-Rptd) very good Typical amount of pain: (Patient-Rptd) some Does pain affect daily life?: (Patient-Rptd) no Are you currently prescribed opioids?: no  Dietary Habits and Nutritional Risks How many meals a day?: (Patient-Rptd) 3 Eats fruit and vegetables daily?: (Patient-Rptd) yes Most meals are obtained by: (Patient-Rptd) preparing own meals In the last 2 weeks, have you had any of the following?: none Diabetic:: no  Functional Status Activities of Daily Living (to include ambulation/medication): (Patient-Rptd) Independent Ambulation: (Patient-Rptd) Independent Medication Administration: (Patient-Rptd) Independent Home Management (perform basic housework or laundry): (Patient-Rptd) Independent Manage your own finances?: (Patient-Rptd) yes Primary transportation is: (Patient-Rptd)  driving Concerns about vision?: no *vision screening is required for WTM* (does have cataracts) Concerns about hearing?: no  Fall Screening Falls in the past year?: 1 (tripped) Number of falls in past year: (Patient-Rptd) 0 Was there an injury with Fall?: 1 (sprained fingers) Fall Risk Category Calculator: (Patient-Rptd) 2 Patient Fall Risk Level: (Patient-Rptd) Moderate Fall Risk  Fall Risk Patient at Risk for Falls Due to: Medication side effect Fall risk Follow up: Falls prevention discussed; Falls evaluation completed  Home and Transportation Safety: All rugs have non-skid backing?: (Patient-Rptd) yes All stairs or steps have railings?: (Patient-Rptd) yes Grab bars in the bathtub or shower?: (Patient-Rptd) yes Have non-skid surface in bathtub or shower?: (Patient-Rptd) yes Good home lighting?: (Patient-Rptd) yes Regular seat belt use?: (Patient-Rptd) yes Hospital stays in the last year:: (Patient-Rptd) no  Cognitive Assessment Difficulty concentrating, remembering, or making decisions? : (Patient-Rptd) yes Will 6CIT or Mini Cog be Completed: yes What year is it?: 0 points What month is it?: 0 points Give patient an address phrase to remember (5 components): [address] About what time is it?: 0 points Count backwards from 20 to 1: 0 points Say the months of the year in reverse: 0 points Repeat the address phrase from earlier: 0 points 6 CIT Score: 0 points  Advance Directives (For Healthcare) Does Patient Have a Medical Advance Directive?: Yes Type of Advance Directive: Healthcare Power of [place]; Living will Copy of Healthcare Power of Attorney in Chart?: No - copy requested Copy of Living Will in Chart?: No - copy requested  Reviewed/Updated  Reviewed/Updated: Reviewed All (Medical, Surgical, Family, Medications, Allergies, Care Teams, Patient Goals)    Allergies (verified) Darvon   Current Medications (verified) Outpatient Encounter Medications as  of 11/16/2024  Medication Sig   acetaminophen  (TYLENOL ) 650 MG CR tablet Take 650 mg by mouth at bedtime.   ALPRAZolam  (XANAX ) 0.25 MG tablet Take 1-2 tablets (0.25-0.5 mg total) by mouth 3 (three)  times daily as needed for anxiety.   atorvastatin  (LIPITOR) 10 MG tablet Take 1 tablet (10 mg total) by mouth daily.   azelastine (ASTELIN) 0.1 % nasal spray Place into both nostrils 2 (two) times daily. Use in each nostril as directed   Cholecalciferol (VITAMIN D3) 50 MCG (2000 UT) TABS Take 1 tablet by mouth daily.   cyclobenzaprine  (FLEXERIL ) 5 MG tablet Take 1 tablet (5 mg total) by mouth at bedtime as needed for muscle spasms.   famotidine (PEPCID) 10 MG tablet Take 10 mg by mouth daily.   traMADol  (ULTRAM ) 50 MG tablet Take 1 tablet (50 mg total) by mouth every 8 (eight) hours as needed.   hydrOXYzine  (ATARAX ) 25 MG tablet Take 1 tablet (25 mg total) by mouth at bedtime as needed and may repeat dose one time if needed for anxiety (sleep). (Patient not taking: Reported on 11/16/2024)   No facility-administered encounter medications on file as of 11/16/2024.    History: Past Medical History:  Diagnosis Date   Adhesive capsulitis    Anxiety    BPPV (benign paroxysmal positional vertigo)    Breast cancer (HCC) 2008   Endometriosis    Fibromyalgia    GERD (gastroesophageal reflux disease)    Hyperlipidemia    Lymphedema    Malaria 1996   Personal history of radiation therapy 2008   PONV (postoperative nausea and vomiting)    Shingles 2002   Suprapubic pressure 08/18/2023   Past Surgical History:  Procedure Laterality Date   BILATERAL SALPINGOOPHORECTOMY     BREAST BIOPSY Left 12/27/2022   MM LT BREAST BX W LOC DEV 1ST LESION IMAGE BX SPEC STEREO GUIDE 12/27/2022 [facility]   BREAST BIOPSY Left 12/27/2022   MM LT BREAST BX W LOC DEV EA AD LESION IMG BX SPEC STEREO GUIDE 12/27/2022 [facility]   BREAST BIOPSY  01/27/2023   MM LT RADIOACTIVE SEED LOC MAMMO GUIDE  01/27/2023 [facility]   BREAST BIOPSY  01/27/2023   MM LT RADIOACTIVE SEED EA ADD LESION LOC MAMMO GUIDE 01/27/2023 [facility]   BREAST CYST EXCISION     BREAST LUMPECTOMY Right 2008   with right axillary node resection   BREAST LUMPECTOMY WITH RADIOACTIVE SEED LOCALIZATION Left 01/28/2023   Procedure: LEFT BREAST BRACKETED LUMPECTOMY WITH RADIOACTIVE SEED LOCALIZATION;  Surgeon: Vanderbilt Ned, MD;  Location: [facility];  Service: General;  Laterality: Left;   CHOLECYSTECTOMY     LAPAROSCOPY     MASS EXCISION Right 04/30/2018   Procedure: EXCISION CYST DEBRIDEMENT INTERPHALANGEAL JOINT RIGHT THUMB;  Surgeon: Murrell Kuba, MD;  Location: [facility];  Service: Orthopedics;  Laterality: Right;   MASTECTOMY Left    SIMPLE MASTECTOMY WITH AXILLARY SENTINEL NODE BIOPSY Left 02/21/2023   Procedure: LEFT SIMPLE MASTECTOMY;  Surgeon: Vanderbilt Ned, MD;  Location: [facility];  Service: General;  Laterality: Left;   TONSILLECTOMY     TOTAL KNEE ARTHROPLASTY Bilateral 07/2018, 10/2018   VAGINAL HYSTERECTOMY     Family History  Problem Relation Age of Onset   Cancer Mother    Rectal cancer Mother    Stroke Mother    Heart disease Father    Heart attack Father    Crohn's disease Maternal Uncle    Liver disease Neg Hx    Colon polyps Neg Hx    Breast cancer Neg Hx    Social History   Occupational History   Not on file  Tobacco Use   Smoking status: Never  Smokeless tobacco: Never  Vaping Use   Vaping status: Never Used  Substance and Sexual Activity   Alcohol use: Yes    Comment: 2 glasses of wine with dinner, 3-4x/week   Drug use: Never   Sexual activity: Not Currently    Birth control/protection: Surgical   Tobacco Counseling Counseling given: Not Answered  SDOH Screenings   Food Insecurity: No Food Insecurity (11/16/2024)  Housing: Unknown (11/16/2024)  Transportation Needs: No Transportation Needs (11/16/2024)   Utilities: Not At Risk (11/16/2024)  Alcohol Screen: Low Risk (11/16/2024)  Depression (PHQ2-9): Low Risk (11/16/2024)  Financial Resource Strain: Low Risk (11/16/2024)  Physical Activity: Insufficiently Active (11/16/2024)  Social Connections: Moderately Isolated (11/16/2024)  Stress: No Stress Concern Present (11/16/2024)  Tobacco Use: Low Risk (11/16/2024)  Health Literacy: Adequate Health Literacy (11/16/2024)   See flowsheets for full screening details  Depression Screen PHQ 2 & 9 Depression Scale- Over the past 2 weeks, how often have you been bothered by any of the following problems? Little interest or pleasure in doing things: 0 Feeling down, depressed, or hopeless (PHQ Adolescent also includes...irritable): 0 PHQ-2 Total Score: 0 Trouble falling or staying asleep, or sleeping too much: 0 Feeling tired or having little energy: 0 Poor appetite or overeating (PHQ Adolescent also includes...weight loss): 0 Feeling bad about yourself - or that you are a failure or have let yourself or your family down: 0 Trouble concentrating on things, such as reading the newspaper or watching television (PHQ Adolescent also includes...like school work): 0 Moving or speaking so slowly that other people could have noticed. Or the opposite - being so fidgety or restless that you have been moving around a lot more than usual: 0 Thoughts that you would be better off dead, or of hurting yourself in some way: 0 PHQ-9 Total Score: 0 If you checked off any problems, how difficult have these problems made it for you to do your work, take care of things at home, or get along with other people?: Not difficult at all  Depression Treatment Depression Interventions/Treatment : [id number] Score <4 Follow-up Not Indicated     Goals Addressed             This Visit's Progress    Patient Stated       11/16/2024, denies goals             Objective:    Today's Vitals   11/16/24 1407  Weight: 171 lb  (77.6 kg)  Height: 5' 6 (1.676 m)   Body mass index is 27.6 kg/m.  Hearing/Vision screen Hearing Screening - Comments:: Denies hearing issues Vision Screening - Comments:: Regular eye exams, [facility] Immunizations and Health Maintenance Health Maintenance  Topic Date Due   Mammogram  12/24/2024   COVID-19 Vaccine (9 - Moderna risk 2025-26 season) 04/07/2025   Bone Density Scan  08/28/2025   Medicare Annual Wellness (AWV)  11/16/2025   Colonoscopy  06/30/2028   DTaP/Tdap/Td (4 - Tdap) 11/19/2028   Pneumococcal Vaccine: 50+ Years  Completed   Influenza Vaccine  Completed   Hepatitis C Screening  Completed   Zoster Vaccines- Shingrix  Completed   Meningococcal B Vaccine  Aged Out        Assessment/Plan:  This is a routine wellness examination for Esty.  Patient Care Team: Early, Camie BRAVO, NP as PCP - General (Nurse Practitioner) Odean Potts, MD as Consulting Physician (Hematology and Oncology)  I have personally reviewed and noted the following in the patients chart:  Medical and social history Use of alcohol, tobacco or illicit drugs  Current medications and supplements including opioid prescriptions. Functional ability and status Nutritional status Physical activity Advanced directives List of other physicians Hospitalizations, surgeries, and ER visits in previous 12 months Vitals Screenings to include cognitive, depression, and falls Referrals and appointments  No orders of the defined types were placed in this encounter.  In addition, I have reviewed and discussed with patient certain preventive protocols, quality metrics, and best practice recommendations. A written personalized care plan for preventive services as well as general preventive health recommendations were provided to patient.   Ardella FORBES Dawn, LPN   [id number]   Return in 1 year (on 11/16/2025).  After Visit Summary: (MyChart) Due to this being a telephonic visit, the after visit  summary with patients personalized plan was offered to patient via MyChart   Nurse Notes: No voiced or noted concerns at this time

## 2024-11-16 NOTE — Patient Instructions (Signed)
 [name],  Thank you for taking the time for your Medicare Wellness Visit. I appreciate your continued commitment to your health goals. Please review the care plan we discussed, and feel free to reach out if I can assist you further.  Please note that Annual Wellness Visits do not include a physical exam. Some assessments may be limited, especially if the visit was conducted virtually. If needed, we may recommend an in-person follow-up with your provider.  Ongoing Care Seeing your primary care provider every 3 to 6 months helps us  monitor your health and provide consistent, personalized care.   Referrals If a referral was made during today's visit and you haven't received any updates within two weeks, please contact the referred provider directly to check on the status.  Recommended Screenings:  Health Maintenance  Topic Date Due   Medicare Annual Wellness Visit  11/10/2024   Breast Cancer Screening  12/24/2024   COVID-19 Vaccine (9 - Moderna risk 2025-26 season) 04/07/2025   Osteoporosis screening with Bone Density Scan  08/28/2025   Colon Cancer Screening  06/30/2028   DTaP/Tdap/Td vaccine (4 - Tdap) 11/19/2028   Pneumococcal Vaccine for age over 26  Completed   Flu Shot  Completed   Hepatitis C Screening  Completed   Zoster (Shingles) Vaccine  Completed   Meningitis B Vaccine  Aged Out       11/16/2024    1:19 PM  Advanced Directives  Does Patient Have a Medical Advance Directive? Yes  Type of Estate Agent of [place];Living will  Copy of Healthcare Power of Attorney in Chart? No - copy requested    Vision: Annual vision screenings are recommended for early detection of glaucoma, cataracts, and diabetic retinopathy. These exams can also reveal signs of chronic conditions such as diabetes and high blood pressure.  Dental: Annual dental screenings help detect early signs of oral cancer, gum disease, and other conditions linked to overall health,  including heart disease and diabetes.  Please see the attached documents for additional preventive care recommendations.

## 2025-01-31 ENCOUNTER — Encounter: Payer: Self-pay | Admitting: Nurse Practitioner

## 2025-11-22 ENCOUNTER — Ambulatory Visit
